# Patient Record
Sex: Male | Born: 1955 | Race: White | Hispanic: No | Marital: Married | Smoking: Never smoker
Health system: Southern US, Community
[De-identification: ages and names within clinical notes are randomized; demographics above are authoritative.]

## PROBLEM LIST (undated history)

## (undated) DIAGNOSIS — U071 COVID-19: Secondary | ICD-10-CM

## (undated) DIAGNOSIS — I442 Atrioventricular block, complete: Secondary | ICD-10-CM

## (undated) DIAGNOSIS — R55 Syncope and collapse: Secondary | ICD-10-CM

---

## 2014-03-28 DIAGNOSIS — F259 Schizoaffective disorder, unspecified: Secondary | ICD-10-CM | POA: Insufficient documentation

## 2018-05-07 DIAGNOSIS — E860 Dehydration: Secondary | ICD-10-CM

## 2018-05-07 DIAGNOSIS — R7309 Other abnormal glucose: Secondary | ICD-10-CM

## 2018-05-07 DIAGNOSIS — F411 Generalized anxiety disorder: Secondary | ICD-10-CM

## 2018-05-07 DIAGNOSIS — R55 Syncope and collapse: Secondary | ICD-10-CM

## 2018-05-07 DIAGNOSIS — I4510 Unspecified right bundle-branch block: Secondary | ICD-10-CM

## 2018-05-08 DIAGNOSIS — R55 Syncope and collapse: Secondary | ICD-10-CM | POA: Diagnosis not present

## 2018-05-08 DIAGNOSIS — I4510 Unspecified right bundle-branch block: Secondary | ICD-10-CM | POA: Diagnosis not present

## 2018-05-08 DIAGNOSIS — R7309 Other abnormal glucose: Secondary | ICD-10-CM | POA: Diagnosis not present

## 2018-05-08 DIAGNOSIS — E860 Dehydration: Secondary | ICD-10-CM | POA: Diagnosis not present

## 2018-05-20 DIAGNOSIS — R55 Syncope and collapse: Secondary | ICD-10-CM | POA: Insufficient documentation

## 2018-08-01 DIAGNOSIS — R55 Syncope and collapse: Secondary | ICD-10-CM

## 2018-08-01 HISTORY — DX: Syncope and collapse: R55

## 2018-09-14 ENCOUNTER — Inpatient Hospital Stay (HOSPITAL_COMMUNITY): Payer: PRIVATE HEALTH INSURANCE

## 2018-09-14 ENCOUNTER — Other Ambulatory Visit: Payer: Self-pay

## 2018-09-14 ENCOUNTER — Encounter (HOSPITAL_COMMUNITY): Payer: Self-pay

## 2018-09-14 ENCOUNTER — Encounter (HOSPITAL_COMMUNITY): Payer: Self-pay | Admitting: *Deleted

## 2018-09-14 ENCOUNTER — Emergency Department (HOSPITAL_COMMUNITY): Payer: PRIVATE HEALTH INSURANCE

## 2018-09-14 ENCOUNTER — Inpatient Hospital Stay (HOSPITAL_COMMUNITY)
Admission: EM | Admit: 2018-09-14 | Discharge: 2018-10-11 | DRG: 242 | Disposition: A | Discharge status: Rehabilitation Facility | Payer: PRIVATE HEALTH INSURANCE | Attending: Internal Medicine | Admitting: Internal Medicine

## 2018-09-14 ENCOUNTER — Inpatient Hospital Stay (HOSPITAL_COMMUNITY)
Admission: EM | Disposition: A | Discharge status: Rehabilitation Facility | Payer: Self-pay | Source: Home / Self Care | Attending: Internal Medicine

## 2018-09-14 DIAGNOSIS — I959 Hypotension, unspecified: Secondary | ICD-10-CM | POA: Diagnosis present

## 2018-09-14 DIAGNOSIS — K5900 Constipation, unspecified: Secondary | ICD-10-CM | POA: Diagnosis not present

## 2018-09-14 DIAGNOSIS — E876 Hypokalemia: Secondary | ICD-10-CM | POA: Diagnosis not present

## 2018-09-14 DIAGNOSIS — Z4659 Encounter for fitting and adjustment of other gastrointestinal appliance and device: Secondary | ICD-10-CM

## 2018-09-14 DIAGNOSIS — J810 Acute pulmonary edema: Secondary | ICD-10-CM

## 2018-09-14 DIAGNOSIS — L8990 Pressure ulcer of unspecified site, unspecified stage: Secondary | ICD-10-CM | POA: Diagnosis present

## 2018-09-14 DIAGNOSIS — J9601 Acute respiratory failure with hypoxia: Secondary | ICD-10-CM | POA: Diagnosis present

## 2018-09-14 DIAGNOSIS — E8770 Fluid overload, unspecified: Secondary | ICD-10-CM | POA: Diagnosis not present

## 2018-09-14 DIAGNOSIS — Z959 Presence of cardiac and vascular implant and graft, unspecified: Secondary | ICD-10-CM | POA: Diagnosis not present

## 2018-09-14 DIAGNOSIS — Z6825 Body mass index (BMI) 25.0-25.9, adult: Secondary | ICD-10-CM

## 2018-09-14 DIAGNOSIS — Z781 Physical restraint status: Secondary | ICD-10-CM

## 2018-09-14 DIAGNOSIS — E875 Hyperkalemia: Secondary | ICD-10-CM | POA: Diagnosis present

## 2018-09-14 DIAGNOSIS — Z9911 Dependence on respirator [ventilator] status: Secondary | ICD-10-CM | POA: Diagnosis not present

## 2018-09-14 DIAGNOSIS — Z79899 Other long term (current) drug therapy: Secondary | ICD-10-CM

## 2018-09-14 DIAGNOSIS — R579 Shock, unspecified: Secondary | ICD-10-CM

## 2018-09-14 DIAGNOSIS — J9602 Acute respiratory failure with hypercapnia: Secondary | ICD-10-CM | POA: Diagnosis present

## 2018-09-14 DIAGNOSIS — R5381 Other malaise: Secondary | ICD-10-CM

## 2018-09-14 DIAGNOSIS — R109 Unspecified abdominal pain: Secondary | ICD-10-CM

## 2018-09-14 DIAGNOSIS — R259 Unspecified abnormal involuntary movements: Secondary | ICD-10-CM | POA: Diagnosis not present

## 2018-09-14 DIAGNOSIS — E871 Hypo-osmolality and hyponatremia: Secondary | ICD-10-CM | POA: Diagnosis not present

## 2018-09-14 DIAGNOSIS — J189 Pneumonia, unspecified organism: Secondary | ICD-10-CM | POA: Diagnosis not present

## 2018-09-14 DIAGNOSIS — F419 Anxiety disorder, unspecified: Secondary | ICD-10-CM | POA: Diagnosis present

## 2018-09-14 DIAGNOSIS — Z95818 Presence of other cardiac implants and grafts: Secondary | ICD-10-CM

## 2018-09-14 DIAGNOSIS — R258 Other abnormal involuntary movements: Secondary | ICD-10-CM | POA: Diagnosis not present

## 2018-09-14 DIAGNOSIS — J690 Pneumonitis due to inhalation of food and vomit: Secondary | ICD-10-CM | POA: Diagnosis not present

## 2018-09-14 DIAGNOSIS — E870 Hyperosmolality and hypernatremia: Secondary | ICD-10-CM | POA: Diagnosis not present

## 2018-09-14 DIAGNOSIS — N179 Acute kidney failure, unspecified: Secondary | ICD-10-CM | POA: Diagnosis present

## 2018-09-14 DIAGNOSIS — J9600 Acute respiratory failure, unspecified whether with hypoxia or hypercapnia: Secondary | ICD-10-CM | POA: Diagnosis not present

## 2018-09-14 DIAGNOSIS — I442 Atrioventricular block, complete: Secondary | ICD-10-CM | POA: Diagnosis present

## 2018-09-14 DIAGNOSIS — F251 Schizoaffective disorder, depressive type: Secondary | ICD-10-CM | POA: Diagnosis present

## 2018-09-14 DIAGNOSIS — E669 Obesity, unspecified: Secondary | ICD-10-CM | POA: Diagnosis present

## 2018-09-14 DIAGNOSIS — D72829 Elevated white blood cell count, unspecified: Secondary | ICD-10-CM

## 2018-09-14 DIAGNOSIS — I361 Nonrheumatic tricuspid (valve) insufficiency: Secondary | ICD-10-CM

## 2018-09-14 DIAGNOSIS — G9340 Encephalopathy, unspecified: Secondary | ICD-10-CM

## 2018-09-14 DIAGNOSIS — G9341 Metabolic encephalopathy: Secondary | ICD-10-CM | POA: Diagnosis not present

## 2018-09-14 DIAGNOSIS — R1310 Dysphagia, unspecified: Secondary | ICD-10-CM | POA: Diagnosis not present

## 2018-09-14 DIAGNOSIS — F329 Major depressive disorder, single episode, unspecified: Secondary | ICD-10-CM | POA: Diagnosis present

## 2018-09-14 DIAGNOSIS — F05 Delirium due to known physiological condition: Secondary | ICD-10-CM | POA: Diagnosis not present

## 2018-09-14 DIAGNOSIS — R197 Diarrhea, unspecified: Secondary | ICD-10-CM | POA: Diagnosis not present

## 2018-09-14 DIAGNOSIS — R4182 Altered mental status, unspecified: Secondary | ICD-10-CM | POA: Diagnosis not present

## 2018-09-14 DIAGNOSIS — R251 Tremor, unspecified: Secondary | ICD-10-CM | POA: Diagnosis not present

## 2018-09-14 DIAGNOSIS — R40243 Glasgow coma scale score 3-8, unspecified time: Secondary | ICD-10-CM | POA: Diagnosis not present

## 2018-09-14 DIAGNOSIS — E872 Acidosis: Secondary | ICD-10-CM | POA: Diagnosis present

## 2018-09-14 DIAGNOSIS — J9690 Respiratory failure, unspecified, unspecified whether with hypoxia or hypercapnia: Secondary | ICD-10-CM

## 2018-09-14 DIAGNOSIS — Z978 Presence of other specified devices: Secondary | ICD-10-CM

## 2018-09-14 DIAGNOSIS — J80 Acute respiratory distress syndrome: Secondary | ICD-10-CM | POA: Diagnosis not present

## 2018-09-14 DIAGNOSIS — A419 Sepsis, unspecified organism: Secondary | ICD-10-CM | POA: Diagnosis not present

## 2018-09-14 DIAGNOSIS — Z452 Encounter for adjustment and management of vascular access device: Secondary | ICD-10-CM

## 2018-09-14 DIAGNOSIS — Y95 Nosocomial condition: Secondary | ICD-10-CM | POA: Diagnosis not present

## 2018-09-14 DIAGNOSIS — Z9289 Personal history of other medical treatment: Secondary | ICD-10-CM

## 2018-09-14 HISTORY — DX: Atrioventricular block, complete: I44.2

## 2018-09-14 HISTORY — PX: TEMPORARY PACEMAKER: CATH118268

## 2018-09-14 HISTORY — PX: LEFT HEART CATH AND CORONARY ANGIOGRAPHY: CATH118249

## 2018-09-14 HISTORY — DX: Syncope and collapse: R55

## 2018-09-14 LAB — COMPREHENSIVE METABOLIC PANEL WITH GFR
ALT: 27 U/L (ref 0–44)
AST: 33 U/L (ref 15–41)
Albumin: 3.6 g/dL (ref 3.5–5.0)
Alkaline Phosphatase: 48 U/L (ref 38–126)
Anion gap: 17 — ABNORMAL HIGH (ref 5–15)
BUN: 17 mg/dL (ref 8–23)
CO2: 17 mmol/L — ABNORMAL LOW (ref 22–32)
Calcium: 8.7 mg/dL — ABNORMAL LOW (ref 8.9–10.3)
Chloride: 107 mmol/L (ref 98–111)
Creatinine, Ser: 1.61 mg/dL — ABNORMAL HIGH (ref 0.61–1.24)
GFR calc Af Amer: 52 mL/min — ABNORMAL LOW
GFR calc non Af Amer: 45 mL/min — ABNORMAL LOW
Glucose, Bld: 161 mg/dL — ABNORMAL HIGH (ref 70–99)
Potassium: 4.1 mmol/L (ref 3.5–5.1)
Sodium: 141 mmol/L (ref 135–145)
Total Bilirubin: 0.9 mg/dL (ref 0.3–1.2)
Total Protein: 6.2 g/dL — ABNORMAL LOW (ref 6.5–8.1)

## 2018-09-14 LAB — POCT I-STAT, CHEM 8
BUN: 17 mg/dL (ref 8–23)
CALCIUM ION: 1.27 mmol/L (ref 1.15–1.40)
CHLORIDE: 106 mmol/L (ref 98–111)
CREATININE: 1.3 mg/dL — AB (ref 0.61–1.24)
GLUCOSE: 155 mg/dL — AB (ref 70–99)
HCT: 42 % (ref 39.0–52.0)
HEMOGLOBIN: 14.3 g/dL (ref 13.0–17.0)
Potassium: 3.7 mmol/L (ref 3.5–5.1)
Sodium: 145 mmol/L (ref 135–145)
TCO2: 24 mmol/L (ref 22–32)

## 2018-09-14 LAB — CBC WITH DIFFERENTIAL/PLATELET
Abs Immature Granulocytes: 0.12 10*3/uL — ABNORMAL HIGH (ref 0.00–0.07)
BASOS ABS: 0.1 10*3/uL (ref 0.0–0.1)
BASOS PCT: 1 %
EOS ABS: 0.5 10*3/uL (ref 0.0–0.5)
EOS PCT: 4 %
HCT: 46 % (ref 39.0–52.0)
Hemoglobin: 14.5 g/dL (ref 13.0–17.0)
IMMATURE GRANULOCYTES: 1 %
Lymphocytes Relative: 24 %
Lymphs Abs: 2.8 10*3/uL (ref 0.7–4.0)
MCH: 29 pg (ref 26.0–34.0)
MCHC: 31.5 g/dL (ref 30.0–36.0)
MCV: 92 fL (ref 80.0–100.0)
Monocytes Absolute: 1.1 10*3/uL — ABNORMAL HIGH (ref 0.1–1.0)
Monocytes Relative: 9 %
NEUTROS PCT: 61 %
NRBC: 0 % (ref 0.0–0.2)
Neutro Abs: 7 10*3/uL (ref 1.7–7.7)
PLATELETS: 263 10*3/uL (ref 150–400)
RBC: 5 MIL/uL (ref 4.22–5.81)
RDW: 14.6 % (ref 11.5–15.5)
WBC: 11.6 10*3/uL — ABNORMAL HIGH (ref 4.0–10.5)

## 2018-09-14 LAB — I-STAT CHEM 8, ED
BUN: 26 mg/dL — ABNORMAL HIGH (ref 8–23)
Calcium, Ion: 1.01 mmol/L — ABNORMAL LOW (ref 1.15–1.40)
Chloride: 109 mmol/L (ref 98–111)
Creatinine, Ser: 1.5 mg/dL — ABNORMAL HIGH (ref 0.61–1.24)
Glucose, Bld: 156 mg/dL — ABNORMAL HIGH (ref 70–99)
HCT: 45 % (ref 39.0–52.0)
Hemoglobin: 15.3 g/dL (ref 13.0–17.0)
Potassium: 7.5 mmol/L (ref 3.5–5.1)
Sodium: 138 mmol/L (ref 135–145)
TCO2: 20 mmol/L — ABNORMAL LOW (ref 22–32)

## 2018-09-14 LAB — POCT I-STAT 3, ART BLOOD GAS (G3+)
ACID-BASE DEFICIT: 4 mmol/L — AB (ref 0.0–2.0)
Acid-Base Excess: 1 mmol/L (ref 0.0–2.0)
BICARBONATE: 22.6 mmol/L (ref 20.0–28.0)
Bicarbonate: 25.7 mmol/L (ref 20.0–28.0)
O2 Saturation: 98 %
O2 Saturation: 100 %
PCO2 ART: 42.4 mmHg (ref 32.0–48.0)
PH ART: 7.291 — AB (ref 7.350–7.450)
PH ART: 7.391 (ref 7.350–7.450)
PO2 ART: 125 mmHg — AB (ref 83.0–108.0)
TCO2: 24 mmol/L (ref 22–32)
TCO2: 27 mmol/L (ref 22–32)
pCO2 arterial: 46.9 mmHg (ref 32.0–48.0)
pO2, Arterial: 272 mmHg — ABNORMAL HIGH (ref 83.0–108.0)

## 2018-09-14 LAB — MAGNESIUM: Magnesium: 1.3 mg/dL — ABNORMAL LOW (ref 1.7–2.4)

## 2018-09-14 LAB — CBC
HCT: 41.1 % (ref 39.0–52.0)
HEMOGLOBIN: 13.1 g/dL (ref 13.0–17.0)
MCH: 28.2 pg (ref 26.0–34.0)
MCHC: 31.9 g/dL (ref 30.0–36.0)
MCV: 88.6 fL (ref 80.0–100.0)
Platelets: 246 10*3/uL (ref 150–400)
RBC: 4.64 MIL/uL (ref 4.22–5.81)
RDW: 14.5 % (ref 11.5–15.5)
WBC: 14.7 10*3/uL — ABNORMAL HIGH (ref 4.0–10.5)
nRBC: 0 % (ref 0.0–0.2)

## 2018-09-14 LAB — CREATININE, SERUM
Creatinine, Ser: 1.13 mg/dL (ref 0.61–1.24)
GFR calc Af Amer: >60 mL/min (ref >60)
GFR calc non Af Amer: >60 mL/min (ref >60)

## 2018-09-14 LAB — ECHOCARDIOGRAM COMPLETE: HEIGHTINCHES: 72 in

## 2018-09-14 LAB — TROPONIN I
TROPONIN I: 0.07 ng/mL — AB (ref <0.03)
TROPONIN I: 0.2 ng/mL — AB (ref <0.03)
TROPONIN I: 0.26 ng/mL — AB (ref <0.03)

## 2018-09-14 LAB — MRSA PCR SCREENING: MRSA BY PCR: POSITIVE — AB

## 2018-09-14 LAB — POCT I-STAT TROPONIN I: Troponin i, poc: 0 ng/mL (ref 0.00–0.08)

## 2018-09-14 LAB — PROTIME-INR
INR: 1.17
Prothrombin Time: 14.8 s (ref 11.4–15.2)

## 2018-09-14 LAB — TSH: TSH: 12.49 u[IU]/mL — AB (ref 0.350–4.500)

## 2018-09-14 SURGERY — LEFT HEART CATH AND CORONARY ANGIOGRAPHY
Anesthesia: LOCAL

## 2018-09-14 MED ORDER — HEPARIN SODIUM (PORCINE) 1000 UNIT/ML IJ SOLN
INTRAMUSCULAR | Status: DC | PRN
  Administered 2018-09-14: 5000 [IU] via INTRAVENOUS

## 2018-09-14 MED ORDER — ONDANSETRON HCL 4 MG/2ML IJ SOLN: 4.0000 mg | Freq: Four times a day (QID) | INTRAMUSCULAR | Status: DC | PRN

## 2018-09-14 MED ORDER — FENTANYL CITRATE (PF) 100 MCG/2ML IJ SOLN
INTRAMUSCULAR | Status: AC
  Administered 2018-09-14: 100 ug via INTRAVENOUS
  Filled 2018-09-14: qty 2

## 2018-09-14 MED ORDER — HEPARIN (PORCINE) IN NACL 1000-0.9 UT/500ML-% IV SOLN
INTRAVENOUS | Status: DC | PRN
  Administered 2018-09-14 (×2): 500 mL

## 2018-09-14 MED ORDER — ROCURONIUM BROMIDE 50 MG/5ML IV SOLN
INTRAVENOUS | Status: AC | PRN
  Administered 2018-09-14: 10 mg via INTRAVENOUS

## 2018-09-14 MED ORDER — MIDAZOLAM HCL 2 MG/2ML IJ SOLN
INTRAMUSCULAR | Status: AC
  Filled 2018-09-14: qty 2

## 2018-09-14 MED ORDER — MIDAZOLAM HCL 2 MG/2ML IJ SOLN
2.0000 mg | INTRAMUSCULAR | Status: DC | PRN
  Administered 2018-09-14 – 2018-09-15 (×9): 2 mg via INTRAVENOUS
  Filled 2018-09-14 (×5): qty 2

## 2018-09-14 MED ORDER — FENTANYL 2500MCG IN NS 250ML (10MCG/ML) PREMIX INFUSION
25.0000 ug/h | INTRAVENOUS | Status: DC
  Administered 2018-09-14: 300 ug/h via INTRAVENOUS
  Administered 2018-09-14: 50 ug/h via INTRAVENOUS
  Administered 2018-09-15: 250 ug/h via INTRAVENOUS
  Filled 2018-09-14 (×3): qty 250

## 2018-09-14 MED ORDER — FENTANYL BOLUS VIA INFUSION
50.0000 ug | INTRAVENOUS | Status: DC | PRN
  Administered 2018-09-14: 50 ug via INTRAVENOUS
  Filled 2018-09-14: qty 50

## 2018-09-14 MED ORDER — ACETAMINOPHEN 325 MG PO TABS: 650.0000 mg | ORAL_TABLET | ORAL | Status: DC | PRN

## 2018-09-14 MED ORDER — SODIUM CHLORIDE 0.9% FLUSH: 3.0000 mL | INTRAVENOUS | Status: DC | PRN

## 2018-09-14 MED ORDER — FUROSEMIDE 10 MG/ML IJ SOLN
20.0000 mg | Freq: Once | INTRAMUSCULAR | Status: AC
  Administered 2018-09-14: 20 mg via INTRAVENOUS
  Filled 2018-09-14: qty 2

## 2018-09-14 MED ORDER — HEPARIN (PORCINE) IN NACL 1000-0.9 UT/500ML-% IV SOLN
INTRAVENOUS | Status: AC
  Filled 2018-09-14: qty 1500

## 2018-09-14 MED ORDER — SODIUM CHLORIDE 0.9 % IV SOLN
250.0000 mL | INTRAVENOUS | Status: DC | PRN
  Administered 2018-09-14: 20 mL via INTRAVENOUS
  Administered 2018-09-15: 02:00:00 via INTRAVENOUS
  Administered 2018-09-18 – 2018-09-24 (×3): 250 mL via INTRAVENOUS

## 2018-09-14 MED ORDER — FENTANYL CITRATE (PF) 100 MCG/2ML IJ SOLN
INTRAMUSCULAR | Status: AC
  Filled 2018-09-14: qty 2

## 2018-09-14 MED ORDER — VERAPAMIL HCL 2.5 MG/ML IV SOLN
INTRAVENOUS | Status: AC
  Filled 2018-09-14: qty 2

## 2018-09-14 MED ORDER — ORAL CARE MOUTH RINSE
15.0000 mL | OROMUCOSAL | Status: DC
  Administered 2018-09-14 – 2018-09-15 (×9): 15 mL via OROMUCOSAL

## 2018-09-14 MED ORDER — LIDOCAINE HCL (PF) 1 % IJ SOLN
INTRAMUSCULAR | Status: AC
  Filled 2018-09-14: qty 30

## 2018-09-14 MED ORDER — CALCIUM CHLORIDE 10 % IV SOLN
INTRAVENOUS | Status: AC | PRN
  Administered 2018-09-14: 1 g via INTRAVENOUS

## 2018-09-14 MED ORDER — MUPIROCIN 2 % EX OINT
1.0000 "application " | TOPICAL_OINTMENT | Freq: Two times a day (BID) | CUTANEOUS | Status: AC
  Administered 2018-09-15 – 2018-09-19 (×10): 1 via NASAL
  Filled 2018-09-14: qty 22

## 2018-09-14 MED ORDER — SODIUM CHLORIDE 0.9% FLUSH
3.0000 mL | Freq: Two times a day (BID) | INTRAVENOUS | Status: DC
  Administered 2018-09-14 – 2018-09-18 (×9): 3 mL via INTRAVENOUS

## 2018-09-14 MED ORDER — MIDAZOLAM HCL 2 MG/2ML IJ SOLN
2.0000 mg | Freq: Once | INTRAMUSCULAR | Status: AC
  Administered 2018-09-14: 2 mg via INTRAVENOUS

## 2018-09-14 MED ORDER — SODIUM CHLORIDE 0.9% FLUSH
3.0000 mL | Freq: Two times a day (BID) | INTRAVENOUS | Status: DC
  Administered 2018-09-14 – 2018-09-17 (×8): 3 mL via INTRAVENOUS

## 2018-09-14 MED ORDER — ONDANSETRON HCL 4 MG/2ML IJ SOLN
4.0000 mg | Freq: Four times a day (QID) | INTRAMUSCULAR | Status: DC | PRN
  Administered 2018-10-07: 4 mg via INTRAVENOUS
  Filled 2018-09-14: qty 2

## 2018-09-14 MED ORDER — FENTANYL BOLUS VIA INFUSION
200.0000 ug | Freq: Once | INTRAVENOUS | Status: AC
  Administered 2018-09-14: 200 ug via INTRAVENOUS
  Filled 2018-09-14: qty 200

## 2018-09-14 MED ORDER — PERFLUTREN LIPID MICROSPHERE
1.0000 mL | INTRAVENOUS | Status: AC | PRN
  Administered 2018-09-14: 2 mL via INTRAVENOUS
  Filled 2018-09-14: qty 10

## 2018-09-14 MED ORDER — SODIUM CHLORIDE 0.9 % IV SOLN
INTRAVENOUS | Status: AC | PRN
  Administered 2018-09-14: 100 mL/h via INTRAVENOUS

## 2018-09-14 MED ORDER — SODIUM CHLORIDE 0.9 % IV SOLN
INTRAVENOUS | Status: AC
  Administered 2018-09-14: 10:00:00 via INTRAVENOUS

## 2018-09-14 MED ORDER — SODIUM CHLORIDE 0.9 % IV SOLN
INTRAVENOUS | Status: AC | PRN
  Administered 2018-09-14: 10 mL/h via INTRAVENOUS

## 2018-09-14 MED ORDER — CHLORHEXIDINE GLUCONATE 0.12% ORAL RINSE (MEDLINE KIT)
15.0000 mL | Freq: Two times a day (BID) | OROMUCOSAL | Status: DC
  Administered 2018-09-14 – 2018-09-15 (×2): 15 mL via OROMUCOSAL

## 2018-09-14 MED ORDER — SODIUM CHLORIDE 0.9 % IV SOLN: 250.0000 mL | INTRAVENOUS | Status: DC | PRN

## 2018-09-14 MED ORDER — SODIUM BICARBONATE 8.4 % IV SOLN
INTRAVENOUS | Status: AC | PRN
  Administered 2018-09-14: 50 meq via INTRAVENOUS

## 2018-09-14 MED ORDER — ETOMIDATE 2 MG/ML IV SOLN
INTRAVENOUS | Status: AC | PRN
  Administered 2018-09-14: 36 mg via INTRAVENOUS

## 2018-09-14 MED ORDER — ACETAMINOPHEN 325 MG PO TABS
650.0000 mg | ORAL_TABLET | ORAL | Status: DC | PRN
  Administered 2018-09-16 – 2018-09-21 (×6): 650 mg via ORAL
  Filled 2018-09-14 (×7): qty 2

## 2018-09-14 MED ORDER — HEPARIN SODIUM (PORCINE) 5000 UNIT/ML IJ SOLN
5000.0000 [IU] | Freq: Three times a day (TID) | INTRAMUSCULAR | Status: DC
  Administered 2018-09-14 – 2018-10-11 (×79): 5000 [IU] via SUBCUTANEOUS
  Filled 2018-09-14 (×78): qty 1

## 2018-09-14 MED ORDER — IOHEXOL 350 MG/ML SOLN
INTRAVENOUS | Status: DC | PRN
  Administered 2018-09-14: 55 mL via INTRAVENOUS

## 2018-09-14 MED ORDER — NITROGLYCERIN 0.4 MG SL SUBL: 0.4000 mg | SUBLINGUAL_TABLET | SUBLINGUAL | Status: DC | PRN

## 2018-09-14 MED ORDER — FENTANYL CITRATE (PF) 100 MCG/2ML IJ SOLN
50.0000 ug | Freq: Once | INTRAMUSCULAR | Status: AC
  Administered 2018-09-14: 50 ug via INTRAVENOUS

## 2018-09-14 MED ORDER — NOREPINEPHRINE 4 MG/250ML-% IV SOLN
0.0000 ug/min | INTRAVENOUS | Status: DC
  Administered 2018-09-14: 2 ug/min via INTRAVENOUS
  Administered 2018-09-15: 4 ug/min via INTRAVENOUS
  Filled 2018-09-14 (×2): qty 250

## 2018-09-14 MED ORDER — ENOXAPARIN SODIUM 40 MG/0.4ML ~~LOC~~ SOLN: 40.0000 mg | SUBCUTANEOUS | Status: DC

## 2018-09-14 MED ORDER — MIDAZOLAM HCL 2 MG/2ML IJ SOLN
1.0000 mg | INTRAMUSCULAR | Status: DC | PRN
  Administered 2018-09-14: 2 mg via INTRAVENOUS
  Filled 2018-09-14 (×5): qty 2

## 2018-09-14 MED ORDER — CHLORHEXIDINE GLUCONATE CLOTH 2 % EX PADS
6.0000 | MEDICATED_PAD | Freq: Every day | CUTANEOUS | Status: DC
  Administered 2018-09-15 – 2018-09-18 (×3): 6 via TOPICAL

## 2018-09-14 MED ORDER — FENTANYL CITRATE (PF) 100 MCG/2ML IJ SOLN
100.0000 ug | Freq: Once | INTRAMUSCULAR | Status: AC
  Administered 2018-09-14: 100 ug via INTRAVENOUS

## 2018-09-14 MED ORDER — SODIUM CHLORIDE 0.9 % IV SOLN
INTRAVENOUS | Status: DC | PRN
  Administered 2018-09-14: 10 mL/h via INTRAVENOUS

## 2018-09-14 SURGICAL SUPPLY — 14 items
CABLE ADAPT CONN TEMP 6FT (ADAPTER) ×2 IMPLANT
CATH INFINITI 5 FR JL3.5 (CATHETERS) ×2 IMPLANT
CATH INFINITI 5FR MULTPACK ANG (CATHETERS) ×2 IMPLANT
CATH S G BIP PACING (SET/KITS/TRAYS/PACK) ×2 IMPLANT
DEVICE RAD COMP TR BAND LRG (VASCULAR PRODUCTS) ×2 IMPLANT
GLIDESHEATH SLEND SS 6F .021 (SHEATH) ×2 IMPLANT
GUIDEWIRE INQWIRE 1.5J.035X260 (WIRE) ×1 IMPLANT
INQWIRE 1.5J .035X260CM (WIRE) ×2
KIT HEART LEFT (KITS) ×2 IMPLANT
PACK CARDIAC CATHETERIZATION (CUSTOM PROCEDURE TRAY) ×2 IMPLANT
SHEATH PINNACLE 6F 10CM (SHEATH) ×2 IMPLANT
SLEEVE REPOSITIONING LENGTH 30 (MISCELLANEOUS) ×2 IMPLANT
TRANSDUCER W/STOPCOCK (MISCELLANEOUS) ×2 IMPLANT
TUBING CIL FLEX 10 FLL-RA (TUBING) ×2 IMPLANT

## 2018-09-14 NOTE — Progress Notes (Signed)
  Echocardiogram 2D Echocardiogram with definity has been performed.  Leta Jungling M 09/14/2018, 10:59 AM

## 2018-09-14 NOTE — Consult Note (Addendum)
ELECTROPHYSIOLOGY CONSULT NOTE    Patient ID: Billy Simon MRN: [id number], DOB/AGE: 62-Jul-1957 62 y.o.  Admit date: 09/14/2018 Date of Consult: 09/14/2018  Primary Physician: No primary care provider on file. Primary Cardiologist: [place] (new this admission) Electrophysiologist: Doylene Splinter (new this admission)  Patient Profile: Billy Simon is a 62 y.o. male with a history of prior syncope who is being seen today for the evaluation of complete heart block at the request of Dr Swaziland.  HPI:  Billy Simon is a 62 y.o. male with no significant past cardiac history. He had a syncopal spell several weeks ago and was seen at [facility].  He underwent 2 week event monitor and echo which by report was unremarkable. This morning, his wife tried to awaken him to go to work and he was hard to arouse. He sat up in bed and then fell back with agonal breathing. EMS was called and on their arrival, he was in complete heart block.  He underwent transcutaneous pacing and was transported to [facility]. In the ER, he was agitated and underwent intubation for airway protection.  Catheterization was done which demonstrated no CAD and normal LVEF and temp wire was placed.  He is currently intubated and sedated requiring vent support.  ROS unable to be obtained   Past Medical History:  Diagnosis Date  . CHB (complete heart block) (HCC) 09/14/2018  . Syncope 08/2018   Hospitalized at [facility]     Surgical History: History reviewed. No pertinent surgical history.   No medications prior to admission.    Inpatient Medications:   Allergies: Allergies not on file  Social History   Socioeconomic History  . Marital status: Not on file    Spouse name: Not on file  . Number of children: Not on file  . Years of education: Not on file  . Highest education level: Not on file  Occupational History  . Not on file  Social Needs  . Financial resource strain: Not on file  . Food insecurity:    Worry:  Not on file    Inability: Not on file  . Transportation needs:    Medical: Not on file    Non-medical: Not on file  Tobacco Use  . Smoking status: Never Smoker  . Smokeless tobacco: Never Used  Substance and Sexual Activity  . Alcohol use: Not on file  . Drug use: Not on file  . Sexual activity: Not on file  Lifestyle  . Physical activity:    Days per week: Not on file    Minutes per session: Not on file  . Stress: Not on file  Relationships  . Social connections:    Talks on phone: Not on file    Gets together: Not on file    Attends religious service: Not on file    Active member of club or organization: Not on file    Attends meetings of clubs or organizations: Not on file    Relationship status: Not on file  . Intimate partner violence:    Fear of current or ex partner: Not on file    Emotionally abused: Not on file    Physically abused: Not on file    Forced sexual activity: Not on file  Other Topics Concern  . Not on file  Social History Narrative  . Not on file     Family History - unable to be obtained   Review of Systems: All other systems reviewed and are otherwise negative except as  noted above.  Physical Exam: Vitals:   09/14/18 0725 09/14/18 0814  BP: (!) 95/59   Pulse: 60   Temp: (!) 96.5 F (35.8 C)   TempSrc: Temporal   SpO2: 100% 100%    GEN- The patient is intubated and sedated HEENT: normocephalic, atraumatic; sclera clear, conjunctiva pink; +ETT Lungs- +vent Heart- Regular rate and rhythm (paced) GI- soft, non-tender, non-distended, bowel sounds present Extremities- no clubbing, cyanosis, or edema  MS- no significant deformity or atrophy Skin- warm and dry, no rash or lesion Psych- euthymic mood, full affect Neuro- strength and sensation are intact  Labs:   Lab Results  Component Value Date   WBC 11.6 (H) 09/14/2018   HGB 14.3 09/14/2018   HCT 42.0 09/14/2018   MCV 92.0 09/14/2018   PLT 263 09/14/2018    Recent Labs  Lab  09/14/18 0741  09/14/18 0823  NA 141   < > 145  K 4.1   < > 3.7  CL 107   < > 106  CO2 17*  --   --   BUN 17   < > 17  CREATININE 1.61*   < > 1.30*  CALCIUM 8.7*  --   --   PROT 6.2*  --   --   BILITOT 0.9  --   --   ALKPHOS 48  --   --   ALT 27  --   --   AST 33  --   --   GLUCOSE 161*   < > 155*   < > = values in this interval not displayed.      Radiology/Studies: Dg Chest Portable 1 View  Result Date: 09/14/2018 CLINICAL DATA:  post intubation/ETT PLACEMENT EXAM: PORTABLE CHEST - 1 VIEW COMPARISON:  none FINDINGS: Endotracheal tube tip 5.4 cm above carina. Bibasilar and left perihilar interstitial prominence, may be related to bronchovascular crowding secondary to low lung volumes. No confluent airspace disease. Heart size upper limits normal for technique. No effusion.  No pneumothorax. Visualized bones unremarkable. IMPRESSION: 1. Good position of endotracheal tube tip. 2. Low lung volumes with bibasilar and left perihilar interstitial prominence/atelectasis Electronically Signed   By: Corlis Leak M.D.   On: 09/14/2018 08:14    [id number] heart block (EMS EKG) (personally reviewed)  Assessment/Plan: 1.  Complete heart block The patient has symptomatic and hemodynamically unstable complete heart block requiring urgent temp wire placement this morning. Unclear how long he was bradycardic for. Catheterization demonstrated no CAD and normal LVEF. He is on no AVN blocking agents We will need to plan on pacing - CCM has seen and he is requiring significant respiratory support at this time. EP will follow   For questions or updates, please contact [facility] Please consult [email or web address] for contact info under Cardiology/STEMI.  Signed, Gypsy Balsam, NP 09/14/2018 8:52 AM  I have seen, examined the patient, and reviewed the above assessment and plan.  Changes to above are made where necessary.  On exam, ill appearing, sedated on vent but agitated.  He was found  unresponsive by family.  Initial rhythm by EMS was heart block with escape at 20s bpm.  He is s/p temp wire placement by Dr [place].  Threshold is 0.52mA currently.  Will require PPM once he recovers clinically.  I have spoken at length with patient's wife as well as with PCCM team.  Hopefully will be improved and ready for PPM tomorrow.  EP to follow closely.  Co Sign: Hillis Range, MD 09/14/2018 4:27 PM

## 2018-09-14 NOTE — Consult Note (Addendum)
PULMONARY / CRITICAL CARE MEDICINE   NAME:  Billy Simon, MRN:  [id number], DOB:  07-Jul-1956, LOS: 0 ADMISSION DATE:  09/14/2018, CONSULTATION DATE: 09/14/2018  REFERRING MD: Cardiology, CHIEF COMPLAINT: Complete heart block  BRIEF HISTORY:    62 year old with complete heart block required a cath and temporary pacer insertion on 09/14/2018 HISTORY OF PRESENT ILLNESS   62 year old with a history of syncope who presented with complete heart block.  He required intubation and transferred to Cath Lab which time he underwent a right femoral temporary pacing wire placement.  On presentation Cath Lab was noted to be hypoxic hypercarbic and acidotic.  And have frothy bloody sputum from his endotracheal tube.  He was on 100% FiO2. Therefore he was not extubated but placed on weaning protocol sedated for tube tolerance chest x-ray and arterial blood gases were ordered to monitor his current condition. SIGNIFICANT PAST MEDICAL HISTORY   Depression and anxiety  SIGNIFICANT EVENTS:  01/15/2018 admitted with complete heart block requiring temporary pacer insertion and intubation. STUDIES:   09/15/1999 nineteen 2D echo>> CULTURES:    ANTIBIOTICS:    LINES/TUBES:   09/14/2018 endotracheal tube>> 09/14/2018 right femoral pacing were temporary>> CONSULTANTS:  09/14/2018 pulmonary critical care SUBJECTIVE:  62 year old post temporary pacemaker placement 09/14/2018 only been agitated in no acute distress  CONSTITUTIONAL: BP 117/81   Pulse (!) 0   Temp (!) 96.5 F (35.8 C) (Temporal)   Resp (!) 48   Ht 6' (1.829 m)   SpO2 (!) 0%   No intake/output data recorded.     Vent Mode: PRVC FiO2 (%):  [100 %] 100 % Set Rate:  [16 bmp] 16 bmp Vt Set:  [550 mL-620 mL] 620 mL PEEP:  [5 cmH20] 5 cmH20 Plateau Pressure:  [16 cmH20] 16 cmH20  PHYSICAL EXAM: General: Well-nourished well-developed male who is extremely anxious and agitated and wrist restraints Neuro: Outside to be an anxious and  agitated follows commands moves all extremities but speaks no focal neurological deficits appreciated HEENT: Endotracheal tube in place, pupils equal reactive. Cardiovascular: Sounds regular currently being paced Lungs: Mild rhonchi mild bibasilar crackles frothy bloody sputum and endotracheal tube Abdomen: Nontender positive bowel sounds Musculoskeletal: Intact right femoral pacer in place Skin: Warm and dry  RESOLVED PROBLEM LIST   ASSESSMENT AND PLAN   Vent dependent respiratory failure in the setting of complete heart block complicated by hypoxia and acidosis. Vent bundle Wean FiO2 Stat portable chest x-ray Stat ABG Concern for pulmonary edema question if he can tolerate diuresis  Complete heart block status post right femoral pacing wire placed 09/14/2018 with plan for permanent pacemaker near future Plans for permanent pacemaker per cardiology 2D echo Per cardiology  Hypotension No need for pressors at this time If blood pressure improves we will attempt diuresis   Mild renal insufficiency Lab Results  Component Value Date   CREATININE 1.30 (H) 09/14/2018   CREATININE 1.50 (H) 09/14/2018   CREATININE 1.61 (H) 09/14/2018  Post cardiac cath 09/14/2018 Avoid nephrotoxic Hold on any diuresis at this time.  History of depression and anxiety Use IV Versed as an anxiolytic IV fentanyl for tube tolerance She remains intubated may need to switch to Precedex in the future   SUMMARY OF TODAY'S PLAN:  Presented with a complete heart block With the Cath Lab with normal coronary arteries temporary pacing wire placed in the right femoral with temporary pacing plan to put permanent pacemaker the 24-hour  Best Practice / Goals of Care / Disposition.   DVT PROPHYLAXIS:  Lovenox SUP: Pepcid NUTRITION: N.p.o. MOBILITY: Bedrest GOALS OF CARE: Code full FAMILY DISCUSSIONS: 18 2019 family updated bedside DISPOSITION remains intensive care unit following temporary pacemaker  wire  LABS  Glucose No results for input(s): GLUCAP in the last 168 hours.  BMET Recent Labs  Lab 09/14/18 0741 09/14/18 0750 09/14/18 0823  NA 141 138 145  K 4.1 7.5* 3.7  CL 107 109 106  CO2 17*  --   --   BUN 17 26* 17  CREATININE 1.61* 1.50* 1.30*  GLUCOSE 161* 156* 155*    Liver Enzymes Recent Labs  Lab 09/14/18 0741  AST 33  ALT 27  ALKPHOS 48  BILITOT 0.9  ALBUMIN 3.6    Electrolytes Recent Labs  Lab 09/14/18 0741  CALCIUM 8.7*    CBC Recent Labs  Lab 09/14/18 0741 09/14/18 0750 09/14/18 0823  WBC 11.6*  --   --   HGB 14.5 15.3 14.3  HCT 46.0 45.0 42.0  PLT 263  --   --     ABG Recent Labs  Lab 09/14/18 0822  PHART 7.291*  PCO2ART 46.9  PO2ART 125.0*    Coag's Recent Labs  Lab 09/14/18 0741  INR 1.17    Sepsis Markers No results for input(s): LATICACIDVEN, PROCALCITON, O2SATVEN in the last 168 hours.  Cardiac Enzymes No results for input(s): TROPONINI, PROBNP in the last 168 hours.  PAST MEDICAL HISTORY :   He  has a past medical history of CHB (complete heart block) (HCC) (09/14/2018) and Syncope (08/2018).  PAST SURGICAL HISTORY:  He  has a past surgical history that includes LEFT HEART CATH AND CORONARY ANGIOGRAPHY (N/A, 09/14/2018) and TEMPORARY PACEMAKER (N/A, 09/14/2018).  Allergies not on file  No current facility-administered medications on file prior to encounter.    No current outpatient medications on file prior to encounter.    FAMILY HISTORY:   His family history is not on file.  SOCIAL HISTORY:  He  reports that he has never smoked. He has never used smokeless tobacco.  REVIEW OF SYSTEMS:    Not available reviewed his family  Total CARE time per NP 62 minutes   Billy Simon ACNP Adolph Pollack PCCM Pager [phone] till 1 pm If no answer page [phone] 09/14/2018, 10:19 AM  Attending Note:  62 year old male with PMH above presenting with a complete heart block and was taken to the cath lab for a  temp pacer placement.  Patient was intubated for pulmonary edema induced respiratory failure and was transferred post to the ICU and PCCM was asked to assist.  On exam, diffuse rales.  I reviewed CXR myself, ETT is in a good position with diffuse pulmonary edema noted.  Discussed with cardiology and PCCM-NP.  Will not be able to extubate today due to high FiO2 demand.  Will begin active diureses as BP allows and pending output and FiO2 need will consider weaning in AM.  Adjust vent for ABG.  Hold off TF for now, start in AM if no chance at extubation in AM.  PCCM will continue to follow.  The patient is critically ill with multiple organ systems failure and requires high complexity decision making for assessment and support, frequent evaluation and titration of therapies, application of advanced monitoring technologies and extensive interpretation of multiple databases.   Critical Care Time devoted to patient care services described in this note is  45  Minutes. This time reflects time of care of this signee Dr Billy Simon. This critical  care time does not reflect procedure time, or teaching time or supervisory time of PA/NP/Med student/Med Resident etc but could involve care discussion time.  Billy Simon, M.D. [facility] Pulmonary/Critical Care Medicine. Pager: [phone]. After hours pager: [phone].

## 2018-09-14 NOTE — Progress Notes (Signed)
Patient Intabated

## 2018-09-14 NOTE — Progress Notes (Signed)
Pt transported to 2H from cath lab w/ no apparent complications.  Increased VT to 620 to match 8 ml/kg VT at this time.   There was an ABG done in cath lab on previous vent settings:  550 vt, 16 r, 100%, 5 peep.  ABG was: 7.29, 47 PCO2, 125 PaO2, 22.6 HCO3, -4, sat 98%  Unit RT aware.

## 2018-09-14 NOTE — ED Provider Notes (Signed)
[facility] EMERGENCY DEPARTMENT Provider Note   CSN: [id number] Arrival date & time: 09/14/18  5093     History   Chief Complaint No chief complaint on file.   HPI Billy Simon is a 62 y.o. male. Level 5 caveat due to altered mental status. HPI Patient brought in for unresponsiveness/bradycardia.  Reportedly confused this morning when wife found him.  EMS arrived and found heart rate of 30s that then went down to the 20s.  Required pacing and 10 of Versed.  Upon arrival patient confused.  Had been hypotensive with the bradycardia.  Reviewing medication list with patient appears mostly psychiatric history. No past medical history on file.  There are no active problems to display for this patient.        Home Medications    Prior to Admission medications   Not on File    Family History No family history on file.  Social History Social History   Tobacco Use  . Smoking status: Not on file  Substance Use Topics  . Alcohol use: Not on file  . Drug use: Not on file     Allergies   Patient has no allergy information on record.   Review of Systems Review of Systems  Unable to perform ROS: Mental status change     Physical Exam Updated Vital Signs There were no vitals taken for this visit.  Physical Exam  Constitutional: He appears well-developed.  HENT:  Head: Atraumatic.  Eyes: No scleral icterus.  Neck: Neck supple.  Cardiovascular:  Bradycardia   Pulmonary/Chest: Effort normal.  Abdominal: There is no tenderness.  Musculoskeletal: He exhibits edema.  Trace edema bilateral lower extremities.  Neurological:  Decreased mental status.  Breathing spontaneously and moving spontaneously.  However will not answer questions.  Will not follow commands.  Skin: Capillary refill takes less than 2 seconds. There is pallor.     ED Treatments / Results  Labs (all labs ordered are listed, but only abnormal results are displayed) Labs  Reviewed  CBC WITH DIFFERENTIAL/PLATELET - Abnormal; Notable for the following components:      Result Value   WBC 11.6 (*)    Monocytes Absolute 1.1 (*)    Abs Immature Granulocytes 0.12 (*)    All other components within normal limits  I-STAT CHEM 8, ED - Abnormal; Notable for the following components:   Potassium 7.5 (*)    BUN 26 (*)    Creatinine, Ser 1.50 (*)    Glucose, Bld 156 (*)    Calcium, Ion 1.01 (*)    TCO2 20 (*)    All other components within normal limits  COMPREHENSIVE METABOLIC PANEL  PROTIME-INR  TSH  I-STAT TROPONIN, ED    EKG None  Radiology No results found.  Procedures Procedure Name: Intubation Date/Time: 09/14/2018 8:05 AM Performed by: Davonna Belling, MD Pre-anesthesia Checklist: Patient identified Oxygen Delivery Method: Non-rebreather mask Preoxygenation: Pre-oxygenation with 100% oxygen Induction Type: Rapid sequence Ventilation: Mask ventilation without difficulty Laryngoscope Size: Glidescope and 3 Tube type: Subglottic suction tube Tube size: 7.5 mm Number of attempts: 1 Airway Equipment and Method: Video-laryngoscopy Placement Confirmation: ETT inserted through vocal cords under direct vision,  Positive ETCO2 and Breath sounds checked- equal and bilateral Secured at: 23 cm Tube secured with: ETT holder Dental Injury: Teeth and Oropharynx as per pre-operative assessment       (including critical care time)  Medications Ordered in ED Medications - No data to display   Initial Impression / Assessment  and Plan / ED Course  I have reviewed the triage vital signs and the nursing notes.  Pertinent labs & imaging results that were available during my care of the patient were reviewed by me and considered in my medical decision making (see chart for details).     Patient came in for bradycardia and altered mental status.  Trans-cutaneously paced with EMS and transferred over for [place].  Met with Dr. Martinique from cardiology upon  arrival.  Decision made to intubate due to decreased mental status and likely need for Cath Lab.  Intubated by myself.  I-STAT Chem-8 showed hyperkalemia but normal kidney function.  However the i-STAT will not show there is any hemolysis.  Was treated empirically with calcium and bicarb.  Will be taken to Cath Lab by Dr. Martinique.  CRITICAL CARE Performed by: Davonna Belling Total critical care time: 30 minutes Critical care time was exclusive of separately billable procedures and treating other patients. Critical care was necessary to treat or prevent imminent or life-threatening deterioration. Critical care was time spent personally by me on the following activities: development of treatment plan with patient and/or surrogate as well as nursing, discussions with consultants, evaluation of patient's response to treatment, examination of patient, obtaining history from patient or surrogate, ordering and performing treatments and interventions, ordering and review of laboratory studies, ordering and review of radiographic studies, pulse oximetry and re-evaluation of patient's condition.   Final Clinical Impressions(s) / ED Diagnoses   Final diagnoses:  Third degree heart block [facility])    ED Discharge Orders    None       Davonna Belling, MD 09/14/18 [phone]

## 2018-09-14 NOTE — H&P (Signed)
Cardiology Admission History and Physical:   Patient ID: Billy Simon; MRN: [id number]; DOB: 12-23-1955   Admission date: 09/14/2018  Primary Care Provider: No primary care provider on file. Primary Cardiologist:   New, Dr. Swaziland Primary Electrophysiologist:  none  Chief Complaint:  CHB  Patient Profile:   Billy Simon is a 62 y.o. male with a history of syncope, hospitalized in [place] and had an echocardiogram as well as an event monitor.  They wanted to do a nuclear stress test, but the patient refused.  He followed up with cardiology yesterday, notes are being requested, but are pending at the time of dictation.  History of Present Illness:   Billy Simon was difficult to arouse this morning when his wife went to wake him.  She stated she tried to get him awake, he sat up, then fell back with agonal respirations.  EMS was called.  He was in complete heart block on EMS arrival with a heart rate in the 30s, dropping into the 20s.  He was unresponsive.  They externally paced him, and brought him to the emergency room.  In route, he was given a total of 10 mg of Versed for pain.    In the emergency room, he was evaluated by Dr. Swaziland.  He was felt unable to protect his airway and was intubated.  His ECG is significantly abnormal with complete heart block and a ventricular escape rhythm of 35.  There was concern for an ischemic cause and he is being taken directly to the Cath Lab.  Med list was obtained from his wife, but she is not currently available.  Information was obtained from records and staff.   Past Medical History:  Diagnosis Date  . CHB (complete heart block) (HCC) 09/14/2018  . Syncope 08/2018   Hospitalized at [facility]    History reviewed. No pertinent surgical history.   Medications Prior to Admission: Prior to Admission medications   BuSpar 15 mg daily, bupropion XL 300 mg daily, benztropine 1 mg daily, Celexa 40 mg daily, Risperdal 2 mg, 3 tablets daily,  Xanax 1 mg as needed     Allergies:   Allergies not on file  Social History: The patient is married.  No other information is available due to patient condition Social History   Socioeconomic History  . Marital status: Not on file    Spouse name: Not on file  . Number of children: Not on file  . Years of education: Not on file  . Highest education level: Not on file  Occupational History  . Not on file  Social Needs  . Financial resource strain: Not on file  . Food insecurity:    Worry: Not on file    Inability: Not on file  . Transportation needs:    Medical: Not on file    Non-medical: Not on file  Tobacco Use  . Smoking status: Never Smoker  . Smokeless tobacco: Never Used  Substance and Sexual Activity  . Alcohol use: Not on file  . Drug use: Not on file  . Sexual activity: Not on file  Lifestyle  . Physical activity:    Days per week: Not on file    Minutes per session: Not on file  . Stress: Not on file  Relationships  . Social connections:    Talks on phone: Not on file    Gets together: Not on file    Attends religious service: Not on file    Active member of club  or organization: Not on file    Attends meetings of clubs or organizations: Not on file    Relationship status: Not on file  . Intimate partner violence:    Fear of current or ex partner: Not on file    Emotionally abused: Not on file    Physically abused: Not on file    Forced sexual activity: Not on file  Other Topics Concern  . Not on file  Social History Narrative  . Not on file    Family History: No information available due to patient condition The patient's family history is not on file.   The patient has no family status information on file.     ROS:  No information available due to patient condition  Physical Exam/Data:   Vitals:   09/14/18 0725 09/14/18 0814  BP: (!) 95/59   Pulse: 60   Temp: (!) 96.5 F (35.8 C)   TempSrc: Temporal   SpO2: 100% 100%   No intake or  output data in the 24 hours ending 09/14/18 0840 There were no vitals filed for this visit. There is no height or weight on file to calculate BMI.  General:  Well nourished, well developed, male, intubated HEENT: normal for age Lymph: no adenopathy Neck:  JVD not elevated Endocrine:  No thryomegaly Vascular: No carotid bruits; FA pulses 2+ bilaterally   Cardiac:  normal S1, S2; RRR; no murmur, no rub or gallop  Lungs:  clear to auscultation anteriorly, no wheezing, rhonchi or rales  Abd: soft, nontender, no hepatomegaly  Ext: No edema Musculoskeletal:  No deformities, BUE and BLE strength normal and equal Skin: warm and dry  Neuro: On the vent post Versed, but not requiring sedation at this time   EKG:  The ECG that was done 10/15 was personally reviewed and demonstrates complete heart block with a ventricular escape rhythm of 35 bpm, wide-complex  Relevant CV Studies:  ECHO: [facility] at [facility]  Laboratory Data:  Chemistry Recent Labs  Lab 09/14/18 0741 09/14/18 0750  NA 141 138  K 4.1 7.5*  CL 107 109  CO2 17*  --   GLUCOSE 161* 156*  BUN 17 26*  CREATININE 1.61* 1.50*  CALCIUM 8.7*  --   GFRNONAA 45*  --   GFRAA 52*  --   ANIONGAP 17*  --     Recent Labs  Lab 09/14/18 0741  PROT 6.2*  ALBUMIN 3.6  AST 33  ALT 27  ALKPHOS 48  BILITOT 0.9   Hematology Recent Labs  Lab 09/14/18 0741 09/14/18 0750  WBC 11.6*  --   RBC 5.00  --   HGB 14.5 15.3  HCT 46.0 45.0  MCV 92.0  --   MCH 29.0  --   MCHC 31.5  --   RDW 14.6  --   PLT 263  --    Cardiac EnzymesNo results for input(s): TROPONINI in the last 168 hours.  Recent Labs  Lab 09/14/18 0748  TROPIPOC 0.00    BNPNo results for input(s): BNP, PROBNP in the last 168 hours.  DDimer No results for input(s): DDIMER in the last 168 hours.  Radiology/Studies:  Dg Chest Portable 1 View  Result Date: 09/14/2018 CLINICAL DATA:  post intubation/ETT PLACEMENT EXAM: PORTABLE CHEST - 1 VIEW COMPARISON:  none  FINDINGS: Endotracheal tube tip 5.4 cm above carina. Bibasilar and left perihilar interstitial prominence, may be related to bronchovascular crowding secondary to low lung volumes. No confluent airspace disease. Heart size upper limits normal  for technique. No effusion.  No pneumothorax. Visualized bones unremarkable. IMPRESSION: 1. Good position of endotracheal tube tip. 2. Low lung volumes with bibasilar and left perihilar interstitial prominence/atelectasis Electronically Signed   By: Corlis Leak M.D.   On: 09/14/2018 08:14    Assessment and Plan:   Principal Problem: 1.  CHB (complete heart block) (HCC) -Dr. Swaziland did a catheterization, cors only.  It was clean. - Meds reviewed and none of them are known to cause bradycardia - EP consult pending  2. Respiratory failure: CCM will be contacted to manage   Severity of Illness: The appropriate patient status for this patient is INPATIENT. Inpatient status is judged to be reasonable and necessary in order to provide the required intensity of service to ensure the patient's safety. The patient's presenting symptoms, physical exam findings, and initial radiographic and laboratory data in the context of their chronic comorbidities is felt to place them at high risk for further clinical deterioration. Furthermore, it is not anticipated that the patient will be medically stable for discharge from the hospital within 2 midnights of admission. The following factors support the patient status of inpatient.   " The patient's presenting symptoms include unresponsiveness. " The worrisome physical exam findings include bradycardia. " The initial radiographic and laboratory data are worrisome because of abnormal renal function and complete heart block. " The chronic co-morbidities include syncope.   * I certify that at the point of admission it is my clinical judgment that the patient will require inpatient hospital care spanning beyond 2 midnights from  the point of admission due to high intensity of service, high risk for further deterioration and high frequency of surveillance required.*    For questions or updates, please contact [facility] Please consult [email or web address] for contact info under Cardiology/STEMI.    SignedTheodore Demark, PA-C  09/14/2018 8:40 AM

## 2018-09-14 NOTE — ED Triage Notes (Signed)
Patient presents to ed via [facility] states  Patients wife was trying to wake him up this am for work and he wouldn't wake up. States her and her son was able to get him up he was c/o nausea and felt like he needed to vomit. seh called 911. Upon ems arrival patient was alert and oriented, however his heart rate was 30 then dropped to 20 . Patient was placed on pacer and given Versed 10 mg IV . Ems states patient became confused after versed. Was given 500cc fluid per ems, patient was being paced at rate of 60 ma of 95. Upon arrival patient is unresponsive, being paced with capture.

## 2018-09-14 NOTE — Progress Notes (Signed)
Patient intabated

## 2018-09-14 NOTE — Progress Notes (Signed)
Dr. Gilmore Laroche paged and spoke with regarding hypotension in the setting of heavy sedation to keep pt calm on vent. Orders received for levo gtt. Will continue to monitor closely. Modena Jansky RN 2 Heart

## 2018-09-14 NOTE — Progress Notes (Signed)
Orthopedic Tech Progress Note Patient Details:  Billy Simon [birth year] [id number]  Ortho Devices Type of Ortho Device: Knee Immobilizer Ortho Device/Splint Interventions: Application   Post Interventions Patient Tolerated: Well   Saul Fordyce 09/14/2018, 2:47 PM

## 2018-09-15 ENCOUNTER — Inpatient Hospital Stay (HOSPITAL_COMMUNITY): Payer: PRIVATE HEALTH INSURANCE

## 2018-09-15 DIAGNOSIS — N179 Acute kidney failure, unspecified: Secondary | ICD-10-CM

## 2018-09-15 LAB — BASIC METABOLIC PANEL
Anion gap: 9 (ref 5–15)
BUN: 20 mg/dL (ref 8–23)
CALCIUM: 8.8 mg/dL — AB (ref 8.9–10.3)
CO2: 23 mmol/L (ref 22–32)
CREATININE: 1.54 mg/dL — AB (ref 0.61–1.24)
Chloride: 109 mmol/L (ref 98–111)
GFR, EST AFRICAN AMERICAN: 54 mL/min — AB (ref >60)
GFR, EST NON AFRICAN AMERICAN: 47 mL/min — AB (ref >60)
Glucose, Bld: 115 mg/dL — ABNORMAL HIGH (ref 70–99)
Potassium: 4.7 mmol/L (ref 3.5–5.1)
SODIUM: 141 mmol/L (ref 135–145)

## 2018-09-15 LAB — BLOOD GAS, ARTERIAL
Acid-base deficit: 0 mmol/L (ref 0.0–2.0)
Bicarbonate: 25 mmol/L (ref 20.0–28.0)
DRAWN BY: 511911
FIO2: 40
MECHVT: 620 mL
O2 Saturation: 98.9 %
PEEP: 8 cmH2O
Patient temperature: 99
RATE: 16 resp/min
pCO2 arterial: 47.9 mmHg (ref 32.0–48.0)
pH, Arterial: 7.339 — ABNORMAL LOW (ref 7.350–7.450)
pO2, Arterial: 161 mmHg — ABNORMAL HIGH (ref 83.0–108.0)

## 2018-09-15 LAB — CBC WITH DIFFERENTIAL/PLATELET
ABS IMMATURE GRANULOCYTES: 0.08 10*3/uL — AB (ref 0.00–0.07)
BASOS PCT: 0 %
Basophils Absolute: 0 10*3/uL (ref 0.0–0.1)
Eosinophils Absolute: 0.2 10*3/uL (ref 0.0–0.5)
Eosinophils Relative: 1 %
HCT: 44.7 % (ref 39.0–52.0)
HEMOGLOBIN: 13.9 g/dL (ref 13.0–17.0)
Immature Granulocytes: 1 %
LYMPHS PCT: 11 %
Lymphs Abs: 1.5 10*3/uL (ref 0.7–4.0)
MCH: 28.1 pg (ref 26.0–34.0)
MCHC: 31.1 g/dL (ref 30.0–36.0)
MCV: 90.5 fL (ref 80.0–100.0)
MONO ABS: 1.7 10*3/uL — AB (ref 0.1–1.0)
Monocytes Relative: 12 %
NEUTROS ABS: 10.1 10*3/uL — AB (ref 1.7–7.7)
Neutrophils Relative %: 75 %
PLATELETS: 171 10*3/uL (ref 150–400)
RBC: 4.94 MIL/uL (ref 4.22–5.81)
RDW: 14.5 % (ref 11.5–15.5)
WBC: 13.6 10*3/uL — AB (ref 4.0–10.5)
nRBC: 0 % (ref 0.0–0.2)

## 2018-09-15 LAB — MAGNESIUM: MAGNESIUM: 1.7 mg/dL (ref 1.7–2.4)

## 2018-09-15 LAB — HIV ANTIBODY (ROUTINE TESTING W REFLEX): HIV SCREEN 4TH GENERATION: NONREACTIVE

## 2018-09-15 LAB — PHOSPHORUS: PHOSPHORUS: 3.8 mg/dL (ref 2.5–4.6)

## 2018-09-15 MED ORDER — HALOPERIDOL LACTATE 5 MG/ML IJ SOLN
INTRAMUSCULAR | Status: AC
  Administered 2018-09-15: 5 mg
  Filled 2018-09-15: qty 1

## 2018-09-15 MED ORDER — MAGNESIUM SULFATE 2 GM/50ML IV SOLN
2.0000 g | Freq: Once | INTRAVENOUS | Status: AC
  Administered 2018-09-15: 2 g via INTRAVENOUS
  Filled 2018-09-15: qty 50

## 2018-09-15 MED ORDER — DEXMEDETOMIDINE HCL IN NACL 400 MCG/100ML IV SOLN: 0.4000 ug/kg/h | INTRAVENOUS | Status: DC

## 2018-09-15 MED ORDER — ORAL CARE MOUTH RINSE
15.0000 mL | Freq: Two times a day (BID) | OROMUCOSAL | Status: DC
  Administered 2018-09-15 – 2018-09-17 (×4): 15 mL via OROMUCOSAL

## 2018-09-15 MED ORDER — HALOPERIDOL LACTATE 5 MG/ML IJ SOLN
5.0000 mg | Freq: Four times a day (QID) | INTRAMUSCULAR | Status: DC | PRN
  Administered 2018-09-15 – 2018-09-18 (×7): 5 mg via INTRAVENOUS
  Filled 2018-09-15 (×9): qty 1

## 2018-09-15 MED ORDER — BENZTROPINE MESYLATE 1 MG PO TABS
1.0000 mg | ORAL_TABLET | Freq: Every day | ORAL | Status: DC
  Administered 2018-09-16 – 2018-09-17 (×2): 1 mg via ORAL
  Filled 2018-09-15 (×4): qty 1

## 2018-09-15 MED ORDER — CITALOPRAM HYDROBROMIDE 20 MG PO TABS
40.0000 mg | ORAL_TABLET | Freq: Every day | ORAL | Status: DC
  Administered 2018-09-16 – 2018-09-20 (×5): 40 mg
  Filled 2018-09-15 (×6): qty 2

## 2018-09-15 MED ORDER — BUSPIRONE HCL 5 MG PO TABS
15.0000 mg | ORAL_TABLET | Freq: Two times a day (BID) | ORAL | Status: DC
  Administered 2018-09-16 – 2018-09-20 (×9): 15 mg via ORAL
  Filled 2018-09-15 (×9): qty 1

## 2018-09-15 MED ORDER — RISPERIDONE 3 MG PO TABS
3.0000 mg | ORAL_TABLET | Freq: Two times a day (BID) | ORAL | Status: DC
  Filled 2018-09-15 (×3): qty 1

## 2018-09-15 MED ORDER — FENTANYL CITRATE (PF) 100 MCG/2ML IJ SOLN
25.0000 ug | INTRAMUSCULAR | Status: DC | PRN
  Administered 2018-09-17 – 2018-09-18 (×8): 50 ug via INTRAVENOUS
  Administered 2018-09-18: 25 ug via INTRAVENOUS
  Administered 2018-09-18 – 2018-09-22 (×15): 50 ug via INTRAVENOUS
  Filled 2018-09-15 (×24): qty 2

## 2018-09-15 MED ORDER — LORAZEPAM 2 MG/ML IJ SOLN
0.5000 mg | INTRAMUSCULAR | Status: DC | PRN
  Administered 2018-09-15 – 2018-09-16 (×6): 1 mg via INTRAVENOUS
  Filled 2018-09-15 (×6): qty 1

## 2018-09-15 MED ORDER — FAMOTIDINE IN NACL 20-0.9 MG/50ML-% IV SOLN
20.0000 mg | Freq: Two times a day (BID) | INTRAVENOUS | Status: DC
  Administered 2018-09-15 – 2018-09-28 (×27): 20 mg via INTRAVENOUS
  Filled 2018-09-15 (×27): qty 50

## 2018-09-15 MED FILL — Heparin Sod (Porcine)-NaCl IV Soln 1000 Unit/500ML-0.9%: INTRAVENOUS | Qty: 500 | Status: AC

## 2018-09-15 MED FILL — Verapamil HCl IV Soln 2.5 MG/ML: INTRAVENOUS | Qty: 2 | Status: AC

## 2018-09-15 MED FILL — Lidocaine HCl Local Preservative Free (PF) Inj 1%: INTRAMUSCULAR | Qty: 30 | Status: AC

## 2018-09-15 NOTE — Procedures (Signed)
Extubation Procedure Note  Patient Details:   Name: Billy Simon DOB: September 08, 1956 MRN: [id number]   Airway Documentation:    Vent end date: 09/15/18 Vent end time: 1130   Evaluation  O2 sats: stable throughout Complications: No apparent complications Patient did tolerate procedure well. Bilateral Breath Sounds: Clear   Yes   Patient extubated to 4L nasal cannula.  Patient unable to perform incentive spirometry at this time due to altered mental status.  Sats maintaining at 90%.  Vitals are stable.  Patient able to phonate.  Will continue to monitor.   Ledell Noss N 09/15/2018, 11:44 AM

## 2018-09-15 NOTE — Progress Notes (Signed)
Orthopedic Tech Progress Note Patient Details:  Billy Simon 02-01-1956 [id number]  Patient ID: [name], male   DOB: [birth year], 62 y.o.   MRN: [id number]   Nikki Dom 09/15/2018, 1:42 PM Viewed order from RN order list

## 2018-09-15 NOTE — Progress Notes (Signed)
Progress Note   Subjective   Extubated today.  Agitated initially, improved with haldol.  Inpatient Medications    Scheduled Meds: . benztropine  1 mg Oral Daily  . busPIRone  15 mg Oral BID  . Chlorhexidine Gluconate Cloth  6 each Topical Q0600  . citalopram  40 mg Per Tube Daily  . heparin  5,000 Units Subcutaneous Q8H  . mouth rinse  15 mL Mouth Rinse BID  . mupirocin ointment  1 application Nasal BID  . risperiDONE  3 mg Per Tube BID  . sodium chloride flush  3 mL Intravenous Q12H  . sodium chloride flush  3 mL Intravenous Q12H   Continuous Infusions: . sodium chloride    . sodium chloride 10 mL/hr at 09/15/18 1800  . dexmedetomidine (PRECEDEX) IV infusion    . famotidine (PEPCID) IV Stopped (09/15/18 1314)  . norepinephrine (LEVOPHED) Adult infusion Stopped (09/15/18 1032)   PRN Meds: sodium chloride, sodium chloride, acetaminophen, fentaNYL (SUBLIMAZE) injection, haloperidol lactate, LORazepam, nitroGLYCERIN, ondansetron (ZOFRAN) IV, sodium chloride flush, sodium chloride flush   Vital Signs    Vitals:   09/15/18 1810 09/15/18 1900 09/15/18 2000 09/15/18 2100  BP: 130/76 114/62 99/67 100/69  Pulse: 80 80 80 80  Resp: 19 16 16 13   Temp:   99.6 F (37.6 C)   TempSrc:   Axillary   SpO2: 98% 100% 99% 100%  Weight:      Height:        Intake/Output Summary (Last 24 hours) at 09/15/2018 2125 Last data filed at 09/15/2018 1944 Gross per 24 hour  Intake 951.77 ml  Output 820 ml  Net 131.77 ml   Filed Weights   09/14/18 1138 09/15/18 0421  Weight: 97.5 kg 99.4 kg    Telemetry    V paced - Personally Reviewed  Physical Exam   GEN- The patient is sedated but extubated  Head- normocephalic, atraumatic Oropharynx- clear Neck- supple, Lungs- Clear to ausculation bilaterally, normal work of breathing Heart- Regular rate and rhythm (paced) GI- soft, NT, ND, + BS Extremities- no clubbing, cyanosis, or edema  MS- no significant deformity or  atrophy Skin- no rash or lesion Psych- euthymic mood, full affect Neuro- strength and sensation are intact   Labs    Chemistry Recent Labs  Lab 09/14/18 0741 09/14/18 0750 09/14/18 0823 09/14/18 1042 09/15/18 0251  NA 141 138 145  --  141  K 4.1 7.5* 3.7  --  4.7  CL 107 109 106  --  109  CO2 17*  --   --   --  23  GLUCOSE 161* 156* 155*  --  115*  BUN 17 26* 17  --  20  CREATININE 1.61* 1.50* 1.30* 1.13 1.54*  CALCIUM 8.7*  --   --   --  8.8*  PROT 6.2*  --   --   --   --   ALBUMIN 3.6  --   --   --   --   AST 33  --   --   --   --   ALT 27  --   --   --   --   ALKPHOS 48  --   --   --   --   BILITOT 0.9  --   --   --   --   GFRNONAA 45*  --   --  >60 47*  GFRAA 52*  --   --  >60 54*  ANIONGAP 17*  --   --   --  9     Hematology Recent Labs  Lab 09/14/18 0741  09/14/18 0823 09/14/18 1042 09/15/18 0905  WBC 11.6*  --   --  14.7* 13.6*  RBC 5.00  --   --  4.64 4.94  HGB 14.5   < > 14.3 13.1 13.9  HCT 46.0   < > 42.0 41.1 44.7  MCV 92.0  --   --  88.6 90.5  MCH 29.0  --   --  28.2 28.1  MCHC 31.5  --   --  31.9 31.1  RDW 14.6  --   --  14.5 14.5  PLT 263  --   --  246 171   < > = values in this interval not displayed.    Cardiac Enzymes Recent Labs  Lab 09/14/18 1042 09/14/18 1431 09/14/18 2214  TROPONINI 0.07* 0.20* 0.26*    Recent Labs  Lab 09/14/18 0748  TROPIPOC 0.00        Assessment & Plan    1.  Complete heart block Now extubated Confused/ agitated Hopefully this will improve overnight.  Will plan PPM tomorrow if he remains stable at that time.  Hillis Range MD, [facility] 09/15/2018 9:25 PM

## 2018-09-15 NOTE — Progress Notes (Signed)
Pt remains intubated and in complete heart block requiring pressor support.  EP will follow along and see again tomorrow  Gypsy Balsam, NP 09/15/2018 10:11 AM

## 2018-09-15 NOTE — Progress Notes (Signed)
Orthopedic Tech Progress Note Patient Details:  Billy Simon 12-Sep-1956 [id number]  Ortho Devices Type of Ortho Device: Knee Immobilizer Ortho Device/Splint Location: rle Ortho Device/Splint Interventions: Application   Post Interventions Patient Tolerated: Well Instructions Provided: Care of device   Nikki Dom 09/15/2018, 1:41 PM

## 2018-09-15 NOTE — Progress Notes (Signed)
Fentanyl gtt, remaining wasted in sink with Josiah Lobo RN Norva Karvonen, RN

## 2018-09-15 NOTE — Progress Notes (Addendum)
Progress Note  Patient Name: Yafet Cline Date of Encounter: 09/15/2018  Primary Cardiologist: No primary care provider on file.   Subjective   Sedated and intubated when seen this AM.   Inpatient Medications    Scheduled Meds: . benztropine  1 mg Oral Daily  . busPIRone  15 mg Oral BID  . chlorhexidine gluconate (MEDLINE KIT)  15 mL Mouth Rinse BID  . Chlorhexidine Gluconate Cloth  6 each Topical Q0600  . citalopram  40 mg Per Tube Daily  . haloperidol lactate      . heparin  5,000 Units Subcutaneous Q8H  . mouth rinse  15 mL Mouth Rinse 10 times per day  . mupirocin ointment  1 application Nasal BID  . risperiDONE  3 mg Per Tube BID  . sodium chloride flush  3 mL Intravenous Q12H  . sodium chloride flush  3 mL Intravenous Q12H   Continuous Infusions: . sodium chloride    . sodium chloride 10 mL/hr at 09/15/18 0900  . dexmedetomidine (PRECEDEX) IV infusion    . famotidine (PEPCID) IV    . magnesium sulfate 1 - 4 g bolus IVPB    . norepinephrine (LEVOPHED) Adult infusion 4 mcg/min (09/15/18 0900)   PRN Meds: sodium chloride, sodium chloride, acetaminophen, fentaNYL (SUBLIMAZE) injection, haloperidol lactate, LORazepam, nitroGLYCERIN, ondansetron (ZOFRAN) IV, sodium chloride flush, sodium chloride flush   Vital Signs    Vitals:   09/15/18 1030 09/15/18 1045 09/15/18 1100 09/15/18 1144  BP: (!) 131/109 94/70 90/68   Pulse: 80 80 80   Resp: _0 Temp:      TempSrc:      SpO2: 97% 97% 98% 90%  Weight:      Height:        Intake/Output Summary (Last 24 hours) at 09/15/2018 1149 Last data filed at 09/15/2018 0900 Gross per 24 hour  Intake 1898.68 ml  Output 925 ml  Net 973.68 ml   Filed Weights   09/14/18 1138 09/15/18 0421  Weight: 97.5 kg 99.4 kg    Telemetry    V-paced - Personally Reviewed  ECG    V-paced rhythm - Personally Reviewed  Physical Exam   GEN:  Intubated and sedated Neck: No JVD Cardiac: RRR, no murmurs, rubs, or gallops.    Respiratory: Clear to auscultation anteriorly. GI: Soft, nontender, non-distended  MS: No edema; No deformity. Neuro:  Nonfocal  Psych: Normal affect   Labs    Chemistry Recent Labs  Lab 09/14/18 0741 09/14/18 0750 09/14/18 0823 09/14/18 1042 09/15/18 0251  NA 141 138 145  --  141  K 4.1 7.5* 3.7  --  4.7  CL 107 109 106  --  109  CO2 17*  --   --   --  23  GLUCOSE 161* 156* 155*  --  115*  BUN 17 26* 17  --  20  CREATININE 1.61* 1.50* 1.30* 1.13 1.54*  CALCIUM 8.7*  --   --   --  8.8*  PROT 6.2*  --   --   --   --   ALBUMIN 3.6  --   --   --   --   AST 33  --   --   --   --   ALT 27  --   --   --   --   ALKPHOS 48  --   --   --   --   BILITOT 0.9  --   --   --   --  GFRNONAA 45*  --   --  >60 47*  GFRAA 52*  --   --  >60 54*  ANIONGAP 17*  --   --   --  9     Hematology Recent Labs  Lab 09/14/18 0741  09/14/18 0823 09/14/18 1042 09/15/18 0905  WBC 11.6*  --   --  14.7* 13.6*  RBC 5.00  --   --  4.64 4.94  HGB 14.5   < > 14.3 13.1 13.9  HCT 46.0   < > 42.0 41.1 44.7  MCV 92.0  --   --  88.6 90.5  MCH 29.0  --   --  28.2 28.1  MCHC 31.5  --   --  31.9 31.1  RDW 14.6  --   --  14.5 14.5  PLT 263  --   --  246 171   < > = values in this interval not displayed.    Cardiac Enzymes Recent Labs  Lab 09/14/18 1042 09/14/18 1431 09/14/18 2214  TROPONINI 0.07* 0.20* 0.26*    Recent Labs  Lab 09/14/18 0748  TROPIPOC 0.00     BNPNo results for input(s): BNP, PROBNP in the last 168 hours.   DDimer No results for input(s): DDIMER in the last 168 hours.   Radiology    Dg Chest Port 1 View  Result Date: 09/15/2018 CLINICAL DATA:  Evaluate ET tube EXAM: PORTABLE CHEST 1 VIEW COMPARISON:  09/14/2018 FINDINGS: The ET tube tip is above the carina. Heart size appears normal. No pleural effusion or edema. No airspace densities. Platelike atelectasis noted in the right lower lung. IMPRESSION: 1. Satisfactory position of ET tube with tip above carina. 2. Right  lower lung platelike atelectasis. Electronically Signed   By: Kerby Moors M.D.   On: 09/15/2018 10:25   Dg Chest Port 1 View  Result Date: 09/14/2018 CLINICAL DATA:  Respiratory failure, syncopal episode, CHF, intubated patient. EXAM: PORTABLE CHEST 1 VIEW COMPARISON:  Portable chest x-ray of September 14, 2018 at 7:42 a.m. FINDINGS: The lungs are mildly hypoinflated. The interstitial markings are coarse especially at the lung bases. There is no pleural effusion or pneumothorax. The cardiac silhouette is mildly enlarged. The pulmonary vascularity is less engorged. The endotracheal tube tip lies at the level of the inferior margin of the clavicular heads which is approximately 3.4 cm above the carina. IMPRESSION: Bibasilar subsegmental atelectasis or less likely pneumonia. Mild prominence of the central pulmonary vascularity which has improved since earlier today. Stable mild cardiomegaly. The endotracheal tube is in reasonable position.  Low low low Electronically Signed   By: David  Martinique M.D.   On: 09/14/2018 10:46   Dg Chest Portable 1 View  Result Date: 09/14/2018 CLINICAL DATA:  post intubation/ETT PLACEMENT EXAM: PORTABLE CHEST - 1 VIEW COMPARISON:  none FINDINGS: Endotracheal tube tip 5.4 cm above carina. Bibasilar and left perihilar interstitial prominence, may be related to bronchovascular crowding secondary to low lung volumes. No confluent airspace disease. Heart size upper limits normal for technique. No effusion.  No pneumothorax. Visualized bones unremarkable. IMPRESSION: 1. Good position of endotracheal tube tip. 2. Low lung volumes with bibasilar and left perihilar interstitial prominence/atelectasis Electronically Signed   By: Lucrezia Europe M.D.   On: 09/14/2018 08:14    Cardiac Studies   Echocardiogram 09/14/2018: Study Conclusions  - Left ventricle: There was mild focal basal hypertrophy of the   septum. Systolic function was normal. The estimated ejection   fraction was in the  range  of 60% to 65%. The study is not   technically sufficient to allow evaluation of LV diastolic   function. Cannot exclude thrombus. - Ventricular septum: Septal motion showed paradox. These changes   are consistent with right ventricular pacing.  LHC 09/14/2018:   The left ventricular systolic function is normal.  LV end diastolic pressure is mildly elevated.  The left ventricular ejection fraction is 55-65% by visual estimate.   1. Normal coronary anatomy 2. Normal LV function 3. Mildly elevated LVEDP 19 mm Hg 4. Successful placement of temporary transvenous pacemaker.  No indication for antiplatelet therapy at this time.  Patient Profile     62 y.o. male with history of recent admission for a syncopal event thought to be secondary to vasovagal episode who presented after a syncopal event at home was found to be in complete heart block.  Assessment & Plan    1. Complete heart block: Mr. Gulas presented after a syncopal event at home that was not preceded by any prodromal symptoms.  He was found to be in complete heart block by EMS.  This is likely be secondary to primary conduction system disease as he underwent LHC yesterday that showed normal coronary arteries and normal LV function.  He also has a strong family history of close relatives who are pacemaker dependent since young age.  He underwent temporary TV pacemaker yesterday and is currently doing well.  Required pressure support on arrival but now off of Levophed with appropriate MAP. EP consulted for permanent pacemaker, but they would like for patient to be respiratory and hemodynamically stable before proceeding with procedure.  They will continue to follow. Ultimately, plan will be for permanent pacemaker placement as this is very unlikely to be a transient episode of heart block given recent history of syncopal events.  I suspect he will be able to do this this week as he is already extubated and off pressors.  2.  Acute hypoxic respiratory failure: He was intubated on arrival to the ED due to inability to protect airway.  This morning when seen he was sedated and intubated, but has been successfully extubated the afternoon and appears to be doing well on 4 L of supplemental oxygen.  He has also been weaned off sedation.  3. AKI: No known history of renal disease. Cr 1.61 on admission.  He received 1 dose of IV Lasix 40 yesterday with improvement in creatinine.  We will continue IV Lasix 20 daily with daily monitoring of electrolytes.  For questions or updates, please contact [place] Please consult [email or web address] for contact info under      Signed, Welford Roche, MD  09/15/2018, 11:49 AM     Agree with note by Dr. Frederico Hamman  Mr. Maurie Boettcher presented with complete heart block.  He has had syncope documented in June of this year without etiology.  He had acute respiratory failure requiring intubation, cardiac catheterization by Dr. Martinique revealing normal coronary arteries, normal LV function.  A temporary transvenous pacemaker was placed.  His ventilatory status is being monitored by PCCM.  He is on low-dose pressors.  He is being sedated.  His exam is benign.  Dr. Rayann Heman of the EP service is following as well.  Plan is for placement of a permanent transvenous pacemaker once he is more alert.  He he was extubated today.  Lorretta Harp, M.D., [place], [facility], [place] [address] [place], [place]  [id number]  [phone] 09/15/2018 3:03 PM

## 2018-09-15 NOTE — Consult Note (Signed)
PULMONARY / CRITICAL CARE MEDICINE   NAME:  Billy Simon, MRN:  [id number], DOB:  1956/11/07, LOS: 1 ADMISSION DATE:  09/14/2018, CONSULTATION DATE: 09/14/2018  REFERRING MD: Cardiology, CHIEF COMPLAINT: Complete heart block  BRIEF HISTORY:    62 year old with complete heart block required a cath and temporary pacer insertion on 09/14/2018 HISTORY OF PRESENT ILLNESS   62 year old with a history of syncope who presented with complete heart block.  He required intubation and transferred to Cath Lab which time he underwent a right femoral temporary pacing wire placement.  On presentation Cath Lab was noted to be hypoxic hypercarbic and acidotic.  And have frothy bloody sputum from his endotracheal tube.  He was on 100% FiO2. Therefore he was not extubated but placed on weaning protocol sedated for tube tolerance chest x-ray and arterial blood gases were ordered to monitor his current condition.  SIGNIFICANT PAST MEDICAL HISTORY   Depression and anxiety  SIGNIFICANT EVENTS:  01/15/2018 admitted with complete heart block requiring temporary pacer insertion and intubation.  STUDIES:   09/15/1999 nineteen 2D echo>>  CULTURES:    ANTIBIOTICS:    LINES/TUBES:  09/14/2018 endotracheal tube>>10/16 09/14/2018 right femoral pacing were temporary>>  CONSULTANTS:  09/14/2018 pulmonary critical care  SUBJECTIVE:  Patient very agitated this AM, was sedated  CONSTITUTIONAL: BP 100/67   Pulse 80   Temp 99.3 F (37.4 C) (Oral)   Resp 16   Ht 6' (1.829 m)   Wt 99.4 kg   SpO2 98%   BMI 29.72 kg/m   I/O last 3 completed shifts: In: 1721.3 [I.V.:1721.3] Out: 2235 [Urine:2235]   Vent Mode: PRVC FiO2 (%):  [40 %-60 %] 40 % Set Rate:  [16 bmp] 16 bmp Vt Set:  [620 mL] 620 mL PEEP:  [5 cmH20-8 cmH20] 8 cmH20 Plateau Pressure:  [18 cmH20-21 cmH20] 20 cmH20  PHYSICAL EXAM: General: Acutely ill appearing, NAD Neuro: Sedate this AM but was very agitated earlier and moving all ext to  commands HEENT: [place]/AT, PERRL, EOM-I and MMM Cardiovascular: Paced, regular, Nl S1/S2 and -M/R/G Lungs: CTA bilaterally Abdomen: Soft, NT, ND and +BS Musculoskeletal: Intact, right leg in a brace Skin: Warm and dry  RESOLVED PROBLEM LIST   ASSESSMENT AND PLAN   Vent dependent respiratory failure in the setting of complete heart block complicated by hypoxia and acidosis. Patient has severe anxiety and is either completely sedate or very agitated and pulling on ETT Was able to take in 1400 ml on 5/5 with FiO2 of 40% Plan to wake up and extubate today since no pacer is to be placed today, if fails then will reintubate  Complete heart block status post right femoral pacing wire placed 09/14/2018 with plan for permanent pacemaker near future Plans for permanent pacemaker per cardiology but not today 2D echo per cards Per cardiology Hold additional lasix today given CVP of 6 and improved O2 demant  Hypotension Levophed for BP support, anticipate as sedation decreases that BP will improve and can d/c levophed and improve renal function  Mild renal insufficiency Lab Results  Component Value Date   CREATININE 1.54 (H) 09/15/2018   CREATININE 1.13 09/14/2018   CREATININE 1.30 (H) 09/14/2018  Post cardiac cath 09/14/2018 Avoid nephrotoxic Hold on any diuresis at this time.  History of depression and anxiety Restart all home anxiolytic Fentanyl to IV PRN for pain PRN ativan IV  SUMMARY OF TODAY'S PLAN:  No pacer to be placed today, patient is extubatable, will add home psych medications, PRN fentanyl and ativan  ordered.  Discussed with Dr. Johney Frame he is to evaluate patient and answer family's questions  Best Practice / Goals of Care / Disposition.   DVT PROPHYLAXIS: Lovenox SUP: Pepcid NUTRITION: N.p.o. MOBILITY: Bedrest GOALS OF CARE: Code full FAMILY DISCUSSIONS: 18 2019 family updated bedside DISPOSITION remains intensive care unit following temporary pacemaker wire  LABS   Glucose No results for input(s): GLUCAP in the last 168 hours.  BMET Recent Labs  Lab 09/14/18 0741 09/14/18 0750 09/14/18 0823 09/14/18 1042 09/15/18 0251  NA 141 138 145  --  141  K 4.1 7.5* 3.7  --  4.7  CL 107 109 106  --  109  CO2 17*  --   --   --  23  BUN 17 26* 17  --  20  CREATININE 1.61* 1.50* 1.30* 1.13 1.54*  GLUCOSE 161* 156* 155*  --  115*    Liver Enzymes Recent Labs  Lab 09/14/18 0741  AST 33  ALT 27  ALKPHOS 48  BILITOT 0.9  ALBUMIN 3.6    Electrolytes Recent Labs  Lab 09/14/18 0741 09/14/18 1042 09/15/18 0251  CALCIUM 8.7*  --  8.8*  MG  --  1.3* 1.7  PHOS  --   --  3.8    CBC Recent Labs  Lab 09/14/18 0741  09/14/18 0823 09/14/18 1042 09/15/18 0905  WBC 11.6*  --   --  14.7* 13.6*  HGB 14.5   < > 14.3 13.1 13.9  HCT 46.0   < > 42.0 41.1 44.7  PLT 263  --   --  246 171   < > = values in this interval not displayed.    ABG Recent Labs  Lab 09/14/18 0822 09/14/18 1151 09/15/18 0325  PHART 7.291* 7.391 7.339*  PCO2ART 46.9 42.4 47.9  PO2ART 125.0* 272.0* 161*    Coag's Recent Labs  Lab 09/14/18 0741  INR 1.17    Sepsis Markers No results for input(s): LATICACIDVEN, PROCALCITON, O2SATVEN in the last 168 hours.  Cardiac Enzymes Recent Labs  Lab 09/14/18 1042 09/14/18 1431 09/14/18 2214  TROPONINI 0.07* 0.20* 0.26*   The patient is critically ill with multiple organ systems failure and requires high complexity decision making for assessment and support, frequent evaluation and titration of therapies, application of advanced monitoring technologies and extensive interpretation of multiple databases.   Critical Care Time devoted to patient care services described in this note is  36  Minutes. This time reflects time of care of this signee Dr Koren Bound. This critical care time does not reflect procedure time, or teaching time or supervisory time of PA/NP/Med student/Med Resident etc but could involve care discussion  time.  Alyson Reedy, M.D. [facility]. Pager: [phone]. After hours pager: [phone].

## 2018-09-16 ENCOUNTER — Inpatient Hospital Stay (HOSPITAL_COMMUNITY): Payer: PRIVATE HEALTH INSURANCE

## 2018-09-16 ENCOUNTER — Inpatient Hospital Stay (HOSPITAL_COMMUNITY): Payer: PRIVATE HEALTH INSURANCE | Admitting: Certified Registered Nurse Anesthetist

## 2018-09-16 ENCOUNTER — Inpatient Hospital Stay (HOSPITAL_COMMUNITY)
Admission: EM | Disposition: A | Discharge status: Rehabilitation Facility | Payer: Self-pay | Source: Home / Self Care | Attending: Internal Medicine

## 2018-09-16 ENCOUNTER — Encounter (HOSPITAL_COMMUNITY): Payer: Self-pay | Admitting: Cardiology

## 2018-09-16 DIAGNOSIS — I442 Atrioventricular block, complete: Secondary | ICD-10-CM

## 2018-09-16 HISTORY — PX: PACEMAKER IMPLANT: EP1218

## 2018-09-16 LAB — POCT I-STAT 3, ART BLOOD GAS (G3+)
Acid-Base Excess: 1 mmol/L (ref 0.0–2.0)
BICARBONATE: 25.5 mmol/L (ref 20.0–28.0)
Bicarbonate: 23.7 mmol/L (ref 20.0–28.0)
O2 SAT: 99 %
O2 Saturation: 92 %
TCO2: 27 mmol/L (ref 22–32)
TCO2: 25 mmol/L (ref 22–32)
pCO2 arterial: 42 mmHg (ref 32.0–48.0)
pCO2 arterial: 36.5 mmHg (ref 32.0–48.0)
pH, Arterial: 7.396 (ref 7.350–7.450)
pH, Arterial: 7.42 (ref 7.350–7.450)
pO2, Arterial: 69 mmHg — ABNORMAL LOW (ref 83.0–108.0)
pO2, Arterial: 131 mmHg — ABNORMAL HIGH (ref 83.0–108.0)

## 2018-09-16 LAB — CBC
HCT: 41.8 % (ref 39.0–52.0)
Hemoglobin: 13.4 g/dL (ref 13.0–17.0)
MCH: 28.8 pg (ref 26.0–34.0)
MCHC: 32.1 g/dL (ref 30.0–36.0)
MCV: 89.7 fL (ref 80.0–100.0)
PLATELETS: 158 10*3/uL (ref 150–400)
RBC: 4.66 MIL/uL (ref 4.22–5.81)
RDW: 14.1 % (ref 11.5–15.5)
WBC: 16.2 10*3/uL — ABNORMAL HIGH (ref 4.0–10.5)
nRBC: 0 % (ref 0.0–0.2)

## 2018-09-16 LAB — GLUCOSE, CAPILLARY: Glucose-Capillary: 122 mg/dL — ABNORMAL HIGH (ref 70–99)

## 2018-09-16 LAB — BASIC METABOLIC PANEL
ANION GAP: 13 (ref 5–15)
BUN: 20 mg/dL (ref 8–23)
CALCIUM: 8.4 mg/dL — AB (ref 8.9–10.3)
CO2: 22 mmol/L (ref 22–32)
Chloride: 108 mmol/L (ref 98–111)
Creatinine, Ser: 1.47 mg/dL — ABNORMAL HIGH (ref 0.61–1.24)
GFR, EST AFRICAN AMERICAN: 58 mL/min — AB (ref >60)
GFR, EST NON AFRICAN AMERICAN: 50 mL/min — AB (ref >60)
GLUCOSE: 113 mg/dL — AB (ref 70–99)
Potassium: 4 mmol/L (ref 3.5–5.1)
Sodium: 143 mmol/L (ref 135–145)

## 2018-09-16 LAB — POCT I-STAT, CHEM 8
BUN: 26 mg/dL — ABNORMAL HIGH (ref 8–23)
CREATININE: 1.5 mg/dL — AB (ref 0.61–1.24)
Calcium, Ion: 1.01 mmol/L — ABNORMAL LOW (ref 1.15–1.40)
Chloride: 109 mmol/L (ref 98–111)
Glucose, Bld: 156 mg/dL — ABNORMAL HIGH (ref 70–99)
HCT: 45 % (ref 39.0–52.0)
HEMOGLOBIN: 15.3 g/dL (ref 13.0–17.0)
POTASSIUM: 7.5 mmol/L — AB (ref 3.5–5.1)
SODIUM: 138 mmol/L (ref 135–145)
TCO2: 20 mmol/L — AB (ref 22–32)

## 2018-09-16 LAB — MAGNESIUM: MAGNESIUM: 1.8 mg/dL (ref 1.7–2.4)

## 2018-09-16 LAB — PROCALCITONIN: PROCALCITONIN: 0.25 ng/mL

## 2018-09-16 LAB — PHOSPHORUS: PHOSPHORUS: 3 mg/dL (ref 2.5–4.6)

## 2018-09-16 SURGERY — PACEMAKER IMPLANT
Anesthesia: General

## 2018-09-16 MED ORDER — SODIUM CHLORIDE 0.9 % IV SOLN
INTRAVENOUS | Status: DC | PRN
  Administered 2018-09-16: 13:00:00

## 2018-09-16 MED ORDER — PHENYLEPHRINE HCL 10 MG/ML IJ SOLN
INTRAMUSCULAR | Status: DC | PRN
  Administered 2018-09-16 (×2): 80 ug via INTRAVENOUS
  Administered 2018-09-16: 40 ug via INTRAVENOUS

## 2018-09-16 MED ORDER — MIDAZOLAM HCL 2 MG/2ML IJ SOLN
INTRAMUSCULAR | Status: AC
  Filled 2018-09-16: qty 2

## 2018-09-16 MED ORDER — CHLORHEXIDINE GLUCONATE 4 % EX LIQD: 60.0000 mL | Freq: Once | CUTANEOUS | Status: DC

## 2018-09-16 MED ORDER — LACTATED RINGERS IV SOLN
INTRAVENOUS | Status: DC | PRN
  Administered 2018-09-16: 11:00:00 via INTRAVENOUS

## 2018-09-16 MED ORDER — RISPERIDONE 2 MG PO TBDP
6.0000 mg | ORAL_TABLET | Freq: Every day | ORAL | Status: DC
  Administered 2018-09-17: 6 mg via SUBLINGUAL
  Filled 2018-09-16 (×3): qty 3

## 2018-09-16 MED ORDER — CEFAZOLIN SODIUM-DEXTROSE 1-4 GM/50ML-% IV SOLN
1.0000 g | Freq: Four times a day (QID) | INTRAVENOUS | Status: AC
  Administered 2018-09-16 – 2018-09-17 (×3): 1 g via INTRAVENOUS
  Filled 2018-09-16 (×3): qty 50

## 2018-09-16 MED ORDER — FENTANYL CITRATE (PF) 250 MCG/5ML IJ SOLN
INTRAMUSCULAR | Status: AC
  Filled 2018-09-16: qty 5

## 2018-09-16 MED ORDER — KETAMINE HCL 50 MG/5ML IJ SOSY
PREFILLED_SYRINGE | INTRAMUSCULAR | Status: AC
  Filled 2018-09-16: qty 10

## 2018-09-16 MED ORDER — SUCCINYLCHOLINE CHLORIDE 20 MG/ML IJ SOLN
INTRAMUSCULAR | Status: DC | PRN
  Administered 2018-09-16: 100 mg via INTRAVENOUS

## 2018-09-16 MED ORDER — SODIUM CHLORIDE 0.9 % IV SOLN
INTRAVENOUS | Status: AC
  Filled 2018-09-16: qty 2

## 2018-09-16 MED ORDER — PROPOFOL 10 MG/ML IV BOLUS
INTRAVENOUS | Status: AC
  Filled 2018-09-16: qty 20

## 2018-09-16 MED ORDER — SODIUM CHLORIDE 0.9 % IV SOLN: 80.0000 mg | INTRAVENOUS | Status: DC

## 2018-09-16 MED ORDER — CEFAZOLIN SODIUM-DEXTROSE 2-4 GM/100ML-% IV SOLN
INTRAVENOUS | Status: AC
  Filled 2018-09-16: qty 100

## 2018-09-16 MED ORDER — HEPARIN (PORCINE) IN NACL 1000-0.9 UT/500ML-% IV SOLN
INTRAVENOUS | Status: DC | PRN
  Administered 2018-09-16: 500 mL

## 2018-09-16 MED ORDER — LIDOCAINE HCL 1 % IJ SOLN
INTRAMUSCULAR | Status: AC
  Filled 2018-09-16: qty 60

## 2018-09-16 MED ORDER — DEXMEDETOMIDINE HCL IN NACL 200 MCG/50ML IV SOLN
INTRAVENOUS | Status: AC
  Filled 2018-09-16: qty 50

## 2018-09-16 MED ORDER — ROCURONIUM BROMIDE 100 MG/10ML IV SOLN
INTRAVENOUS | Status: DC | PRN
  Administered 2018-09-16 (×2): 50 mg via INTRAVENOUS

## 2018-09-16 MED ORDER — PROPOFOL 10 MG/ML IV BOLUS
INTRAVENOUS | Status: DC | PRN
  Administered 2018-09-16: 200 mg via INTRAVENOUS

## 2018-09-16 MED ORDER — LIDOCAINE HCL (PF) 1 % IJ SOLN
INTRAMUSCULAR | Status: DC | PRN
  Administered 2018-09-16: 50 mL

## 2018-09-16 MED ORDER — DEXMEDETOMIDINE HCL IN NACL 400 MCG/100ML IV SOLN
0.4000 ug/kg/h | INTRAVENOUS | Status: DC
  Administered 2018-09-16: 0.7 ug/kg/h via INTRAVENOUS
  Administered 2018-09-16 (×2): 0.4 ug/kg/h via INTRAVENOUS
  Administered 2018-09-17: 0.7 ug/kg/h via INTRAVENOUS
  Administered 2018-09-17 (×3): 1.2 ug/kg/h via INTRAVENOUS
  Administered 2018-09-17: 1.1 ug/kg/h via INTRAVENOUS
  Administered 2018-09-17: 1 ug/kg/h via INTRAVENOUS
  Administered 2018-09-18: 1.2 ug/kg/h via INTRAVENOUS
  Administered 2018-09-18: 1.799 ug/kg/h via INTRAVENOUS
  Administered 2018-09-18: 1.8 ug/kg/h via INTRAVENOUS
  Administered 2018-09-18 (×2): 1.2 ug/kg/h via INTRAVENOUS
  Administered 2018-09-18: 1.8 ug/kg/h via INTRAVENOUS
  Administered 2018-09-18: 1.1 ug/kg/h via INTRAVENOUS
  Administered 2018-09-18 (×2): 1.2 ug/kg/h via INTRAVENOUS
  Administered 2018-09-19 (×5): 1.8 ug/kg/h via INTRAVENOUS
  Administered 2018-09-19: 1.2 ug/kg/h via INTRAVENOUS
  Administered 2018-09-19 – 2018-09-21 (×11): 1.8 ug/kg/h via INTRAVENOUS
  Administered 2018-09-21: 0.8 ug/kg/h via INTRAVENOUS
  Administered 2018-09-21 (×4): 1.2 ug/kg/h via INTRAVENOUS
  Administered 2018-09-21 (×2): 1.8 ug/kg/h via INTRAVENOUS
  Administered 2018-09-22 (×2): 1.2 ug/kg/h via INTRAVENOUS
  Filled 2018-09-16 (×48): qty 100

## 2018-09-16 MED ORDER — CEFAZOLIN SODIUM-DEXTROSE 2-3 GM-%(50ML) IV SOLR
INTRAVENOUS | Status: DC | PRN
  Administered 2018-09-16: 2 g via INTRAVENOUS

## 2018-09-16 MED ORDER — SODIUM CHLORIDE 0.9 % IV SOLN
INTRAVENOUS | Status: DC
  Administered 2018-09-16 – 2018-09-20 (×4): via INTRAVENOUS

## 2018-09-16 MED ORDER — CEFAZOLIN SODIUM-DEXTROSE 2-4 GM/100ML-% IV SOLN: 2.0000 g | INTRAVENOUS | Status: DC

## 2018-09-16 MED ORDER — LIDOCAINE HCL (CARDIAC) PF 100 MG/5ML IV SOSY
PREFILLED_SYRINGE | INTRAVENOUS | Status: DC | PRN
  Administered 2018-09-16: 100 mg via INTRAVENOUS

## 2018-09-16 MED ORDER — RISPERIDONE 2 MG PO TBDP: 6.0000 mg | ORAL_TABLET | Freq: Every day | ORAL | Status: DC

## 2018-09-16 MED ORDER — MIDAZOLAM HCL 5 MG/5ML IJ SOLN
INTRAMUSCULAR | Status: DC | PRN
  Administered 2018-09-16: 2 mg via INTRAVENOUS

## 2018-09-16 MED ORDER — FENTANYL CITRATE (PF) 250 MCG/5ML IJ SOLN
INTRAMUSCULAR | Status: DC | PRN
  Administered 2018-09-16: 50 ug via INTRAVENOUS

## 2018-09-16 MED ORDER — HEPARIN (PORCINE) IN NACL 1000-0.9 UT/500ML-% IV SOLN
INTRAVENOUS | Status: AC
  Filled 2018-09-16: qty 500

## 2018-09-16 MED ORDER — SODIUM CHLORIDE 0.9 % IV SOLN
INTRAVENOUS | Status: DC | PRN
  Administered 2018-09-16: 30 ug/min via INTRAVENOUS

## 2018-09-16 SURGICAL SUPPLY — 9 items
BLANKET WARM UNDERBOD FULL ACC (MISCELLANEOUS) ×3 IMPLANT
CABLE SURGICAL S-101-97-12 (CABLE) ×3 IMPLANT
HOVERMATT SINGLE USE (MISCELLANEOUS) ×3 IMPLANT
LEAD TENDRIL MRI 52CM LPA1200M (Lead) ×3 IMPLANT
LEAD TENDRIL MRI 58CM LPA1200M (Lead) ×3 IMPLANT
PACEMAKER ASSURITY DR-RF (Pacemaker) ×3 IMPLANT
PAD PRO RADIOLUCENT 2001M-C (PAD) ×3 IMPLANT
SHEATH CLASSIC 8F (SHEATH) ×6 IMPLANT
TRAY PACEMAKER INSERTION (PACKS) ×3 IMPLANT

## 2018-09-16 NOTE — Progress Notes (Addendum)
PCCM interval note  Examined after return from pacemaker insertion Sedated on the vent  ABG is acceptable, CT head reviewed with no acute abnormality. Noted to have low-grade fevers, elevated WBC count of 16 He is already on Ancef for surgical prophylaxis We will check blood cultures and procalcitonin Wake up and start weaning trials tomorrow morning.  Family updated at bedside  Additional CC time - 35 mins Chilton Greathouse MD [facility] 09/16/2018, 4:34 PM

## 2018-09-16 NOTE — Anesthesia Postprocedure Evaluation (Signed)
Anesthesia Post Note  Patient: Billy Simon  Procedure(s) Performed: PACEMAKER IMPLANT (N/A )     Patient location during evaluation: SICU Anesthesia Type: General Level of consciousness: sedated Pain management: pain level controlled Vital Signs Assessment: post-procedure vital signs reviewed and stable Respiratory status: patient remains intubated per anesthesia plan Cardiovascular status: stable Postop Assessment: no apparent nausea or vomiting Anesthetic complications: no    Last Vitals:  Vitals:   09/16/18 1608 09/16/18 1700  BP:  140/78  Pulse:  69  Resp:  19  Temp:    SpO2: 97% 98%    Last Pain:  Vitals:   09/16/18 0500  TempSrc: Axillary                 Kennieth Rad

## 2018-09-16 NOTE — Transfer of Care (Signed)
Immediate Anesthesia Transfer of Care Note  Patient: Billy Simon  Procedure(s) Performed: PACEMAKER IMPLANT (N/A )  Patient Location: ICU  Anesthesia Type:General  Level of Consciousness: sedated and Patient remains intubated per anesthesia plan  Airway & Oxygen Therapy: Patient remains intubated per anesthesia plan and Patient placed on Ventilator (see vital sign flow sheet for setting)  Post-op Assessment: Report given to RN and Post -op Vital signs reviewed and stable  BP 110/73; HR 80 paced. spo2 97% oett  Post vital signs: Reviewed and stable  Last Vitals:  Vitals Value Taken Time  BP    Temp    Pulse 69 09/16/2018  1:57 PM  Resp 18 09/16/2018  1:57 PM  SpO2 99 % 09/16/2018  1:57 PM  Vitals shown include unvalidated device data.  Last Pain:  Vitals:   09/16/18 0500  TempSrc: Axillary         Complications: No apparent anesthesia complications

## 2018-09-16 NOTE — Progress Notes (Signed)
[facility] Deter[facility] notified of patients increasing agitation and confusion. Orders received. Will begin Precidex.

## 2018-09-16 NOTE — Anesthesia Procedure Notes (Signed)
Procedure Name: Intubation Date/Time: 09/16/2018 11:31 AM Performed by: Shirlyn Goltz, CRNA Pre-anesthesia Checklist: Patient identified, Emergency Drugs available, Patient being monitored and Suction available Patient Re-evaluated:Patient Re-evaluated prior to induction Oxygen Delivery Method: Circle system utilized Preoxygenation: Pre-oxygenation with 100% oxygen Induction Type: IV induction Ventilation: Mask ventilation without difficulty Laryngoscope Size: Mac and 4 Grade View: Grade II Tube type: Oral Tube size: 7.5 mm Number of attempts: 1 Airway Equipment and Method: Stylet Placement Confirmation: ETT inserted through vocal cords under direct vision,  positive ETCO2 and breath sounds checked- equal and bilateral Secured at: 23 cm Tube secured with: Tape Dental Injury: Teeth and Oropharynx as per pre-operative assessment

## 2018-09-16 NOTE — Progress Notes (Signed)
SLP Cancellation Note  Patient Details Name: Billy Simon MRN: [id number] DOB: [birth year]   Cancelled treatment:       Reason Eval/Treat Not Completed: Medical issues which prohibited therapy. Requiring sedation for agitation.   Ferdinand Lango MA, CCC-SLP     Taran Hable Meryl 09/16/2018, 9:29 AM

## 2018-09-16 NOTE — Anesthesia Preprocedure Evaluation (Addendum)
Anesthesia Evaluation  Patient identified by MRN, date of birth, ID band Patient awake    Reviewed: Allergy & Precautions, NPO status , Patient's Chart, lab work & pertinent test results  Airway Mallampati: II  TM Distance: >3 FB Neck ROM: Full    Dental  (+) Dental Advisory Given   Pulmonary neg pulmonary ROS,    breath sounds clear to auscultation       Cardiovascular + dysrhythmias (complete heart block)  Rhythm:Regular Rate:Normal  Echocardiogram 09/14/2018: Study Conclusions  - Left ventricle: There was mild focal basal hypertrophy of theseptum. Systolic function was normal. The estimated ejectionfraction was in the range of 60% to 65%. The study is nottechnically sufficient to allow evaluation of LV diastolicfunction. Cannot exclude thrombus. - Ventricular septum: Septal motion showed paradox. These changesare consistent with right ventricular pacing.  LHC 09/14/2018:   The left ventricular systolic function is normal.  LV end diastolic pressure is mildly elevated.  The left ventricular ejection fraction is 55-65% by visual estimate.  1. Normal coronary anatomy 2. Normal LV function 3. Mildly elevated LVEDP 19 mm Hg 4. Successful placement of temporary transvenous pacemaker.   Neuro/Psych PSYCHIATRIC DISORDERS Severe agitation and delerium    GI/Hepatic negative GI ROS, Neg liver ROS,   Endo/Other  negative endocrine ROS  Renal/GU Renal InsufficiencyRenal disease     Musculoskeletal   Abdominal   Peds  Hematology negative hematology ROS (+)   Anesthesia Other Findings   Reproductive/Obstetrics                            Lab Results  Component Value Date   WBC 16.2 (H) 09/16/2018   HGB 13.4 09/16/2018   HCT 41.8 09/16/2018   MCV 89.7 09/16/2018   PLT 158 09/16/2018   Lab Results  Component Value Date   CREATININE 1.47 (H) 09/16/2018   BUN 20 09/16/2018   NA  143 09/16/2018   K 4.0 09/16/2018   CL 108 09/16/2018   CO2 22 09/16/2018    Anesthesia Physical Anesthesia Plan  ASA: III  Anesthesia Plan: General   Post-op Pain Management:    Induction: Intravenous  PONV Risk Score and Plan: 2 and Ondansetron, Dexamethasone and Treatment may vary due to age or medical condition  Airway Management Planned: Oral ETT  Additional Equipment:   Intra-op Plan:   Post-operative Plan: Post-operative intubation/ventilation  Informed Consent: I have reviewed the patients History and Physical, chart, labs and discussed the procedure including the risks, benefits and alternatives for the proposed anesthesia with the patient or authorized representative who has indicated his/her understanding and acceptance.   Dental advisory given  Plan Discussed with: CRNA  Anesthesia Plan Comments:        Anesthesia Quick Evaluation

## 2018-09-16 NOTE — Progress Notes (Signed)
Patient returned from cath lab intubated.  Placed on ventilator and is currently tolerating well.

## 2018-09-16 NOTE — Progress Notes (Signed)
Patient remains extremely agitated and confused. Patient sits himself up in bed despite restraints and continues to attempt to bend leg with pacer. Patient unable to reorient. Ativan helps briefly with agitation. Patients son expressed concern about patient becoming addicted to Ativan. Patients son educated. Will continue to monitor and attempt to reorient patient.

## 2018-09-16 NOTE — Progress Notes (Addendum)
Electrophysiology Rounding Note  Patient Name: Billy Simon Date of Encounter: 09/16/2018  Electrophysiologist: allred   Subjective   The patient is sedated. Family at bedside.   Inpatient Medications    Scheduled Meds: . benztropine  1 mg Oral Daily  . busPIRone  15 mg Oral BID  . Chlorhexidine Gluconate Cloth  6 each Topical Q0600  . citalopram  40 mg Per Tube Daily  . heparin  5,000 Units Subcutaneous Q8H  . mouth rinse  15 mL Mouth Rinse BID  . mupirocin ointment  1 application Nasal BID  . risperiDONE  3 mg Per Tube BID  . sodium chloride flush  3 mL Intravenous Q12H  . sodium chloride flush  3 mL Intravenous Q12H   Continuous Infusions: . sodium chloride    . sodium chloride Stopped (09/15/18 2155)  . dexmedetomidine (PRECEDEX) IV infusion 0.4 mcg/kg/hr (09/16/18 0845)  . famotidine (PEPCID) IV 20 mg (09/16/18 0848)  . norepinephrine (LEVOPHED) Adult infusion Stopped (09/15/18 1032)   PRN Meds: sodium chloride, sodium chloride, acetaminophen, fentaNYL (SUBLIMAZE) injection, haloperidol lactate, nitroGLYCERIN, ondansetron (ZOFRAN) IV, sodium chloride flush, sodium chloride flush   Vital Signs    Vitals:   09/16/18 0600 09/16/18 0700 09/16/18 0800 09/16/18 0900  BP: 96/62 95/66 97/66  95/64  Pulse: 80 80 80 80  Resp: 20 (!) 24 19 (!) 22  Temp:   100.2 F (37.9 C)   TempSrc:      SpO2: 99% 98% 99% 99%  Weight:      Height:        Intake/Output Summary (Last 24 hours) at 09/16/2018 0910 Last data filed at 09/16/2018 0900 Gross per 24 hour  Intake 381.98 ml  Output 930 ml  Net -548.02 ml   Filed Weights   09/14/18 1138 09/15/18 0421 09/16/18 0408  Weight: 97.5 kg 99.4 kg 99.2 kg    Physical Exam    GEN- The patient is sedated Head- normocephalic, atraumatic Eyes-  Sclera clear, conjunctiva pink Ears- hearing intact Oropharynx- clear Neck- supple Lungs- Clear to ausculation bilaterally, normal work of breathing Heart- Regular rate and rhythm  (paced) GI- soft, NT, ND, + BS Extremities- no clubbing, cyanosis, or edema, +temp wire right groin Skin- no rash or lesion Psych- euthymic mood, full affect Neuro- strength and sensation are intact  Labs    CBC Recent Labs    09/14/18 0741  09/15/18 0905 09/16/18 0806  WBC 11.6*   < > 13.6* 16.2*  NEUTROABS 7.0  --  10.1*  --   HGB 14.5   < > 13.9 13.4  HCT 46.0   < > 44.7 41.8  MCV 92.0   < > 90.5 89.7  PLT 263   < > 171 158   < > = values in this interval not displayed.   Basic Metabolic Panel Recent Labs    16/10/96 0251 09/16/18 0418  NA 141 143  K 4.7 4.0  CL 109 108  CO2 23 22  GLUCOSE 115* 113*  BUN 20 20  CREATININE 1.54* 1.47*  CALCIUM 8.8* 8.4*  MG 1.7 1.8  PHOS 3.8 3.0   Liver Function Tests Recent Labs    09/14/18 0741  AST 33  ALT 27  ALKPHOS 48  BILITOT 0.9  PROT 6.2*  ALBUMIN 3.6   No results for input(s): LIPASE, AMYLASE in the last 72 hours. Cardiac Enzymes Recent Labs    09/14/18 1042 09/14/18 1431 09/14/18 2214  TROPONINI 0.07* 0.20* 0.26*   Thyroid Function Tests  Recent Labs    09/14/18 0741  TSH 12.490*    Telemetry    V pacing (personally reviewed)  Radiology    Dg Chest Port 1 View  Result Date: 09/15/2018 CLINICAL DATA:  Evaluate ET tube EXAM: PORTABLE CHEST 1 VIEW COMPARISON:  09/14/2018 FINDINGS: The ET tube tip is above the carina. Heart size appears normal. No pleural effusion or edema. No airspace densities. Platelike atelectasis noted in the right lower lung. IMPRESSION: 1. Satisfactory position of ET tube with tip above carina. 2. Right lower lung platelike atelectasis. Electronically Signed   By: Signa Kell M.D.   On: 09/15/2018 10:25   Dg Chest Port 1 View  Result Date: 09/14/2018 CLINICAL DATA:  Respiratory failure, syncopal episode, CHF, intubated patient. EXAM: PORTABLE CHEST 1 VIEW COMPARISON:  Portable chest x-ray of September 14, 2018 at 7:42 a.m. FINDINGS: The lungs are mildly hypoinflated. The  interstitial markings are coarse especially at the lung bases. There is no pleural effusion or pneumothorax. The cardiac silhouette is mildly enlarged. The pulmonary vascularity is less engorged. The endotracheal tube tip lies at the level of the inferior margin of the clavicular heads which is approximately 3.4 cm above the carina. IMPRESSION: Bibasilar subsegmental atelectasis or less likely pneumonia. Mild prominence of the central pulmonary vascularity which has improved since earlier today. Stable mild cardiomegaly. The endotracheal tube is in reasonable position.  Low low low Electronically Signed   By: David  [place] M.D.   On: 09/14/2018 10:46     Patient Profile     Billy Simon is a 62 y.o. male admitted with complete heart block  Assessment & Simon    1.  Complete heart block Dependent on temp wire currently Billy Simon need to consider permanent pacing today He remains agitated when he awakens and is currently sedated. Family is concerned he has been off home meds and this is causing. They report that he answered EMS questions appropriately when they arrived day of admission. Temp wire has now been in for 3 days.   He has low grade fever, WBC pending this morning - concerned temp wire could be source of infection. The sooner we can remove the better. Billy Simon tentatively Simon for PPM later today with anesthesia support. Billy Simon discuss placing NG tube at that time to allow for home meds to be given.   Dr Elberta Fortis to see later this morning  For questions or updates, please contact [facility] Please consult [email or web address] for contact info under Cardiology/STEMI.  Signed, Billy Balsam, NP  09/16/2018, 9:10 AM   I have seen and examined this patient with Billy Simon.  Agree with above, note added to reflect my findings.  On exam, RRR, no murmurs, lungs clear. Continued heart block. Simon for pacemaker today. Does have infection and elevated WBC which as been since admission. Due to agitation and  need to remove the temporary wire, Billy Simon pacemaker today.  [name] has presented today for surgery, with the diagnosis of complete heart block.  The various methods of treatment have been discussed with the patient and family. After consideration of risks, benefits and other options for treatment, the patient has consented to  Procedure(s): Pacemaker imlant as a surgical intervention .  Risks include but not limited to bleeding, tamponade, infection, pneumothorax, among others. The patient's history has been reviewed, patient examined, no change in status, stable for surgery.  I have reviewed the patient's chart and labs.  Questions were answered to the patient's satisfaction.  Spirit Wernli M. Jahshua Bonito MD 09/16/2018 10:47 AM

## 2018-09-16 NOTE — Progress Notes (Signed)
PULMONARY / CRITICAL CARE MEDICINE   NAME:  Billy Simon, MRN:  [id number], DOB:  [birth year], LOS: 2 ADMISSION DATE:  09/14/2018, CONSULTATION DATE: 09/14/2018  REFERRING MD: Cardiology, CHIEF COMPLAINT: Complete heart block  BRIEF HISTORY:    62 year old with h/o complete heart block required a cath and temporary femoral pacer insertion on 09/14/2018. Intubated for respiratory failure with hypoxia, hypercarbia, bloody frothy sputum.  Briefly on pressors for hypotension.  SIGNIFICANT PAST MEDICAL HISTORY   Depression and anxiety  SIGNIFICANT EVENTS:  09/14/2018 admitted with complete heart block requiring temporary pacer insertion and intubation. 10/16 Extubated. Started on precedex for agitation  STUDIES:   09/14/2018 2D echo-focal basal hypertrophy, LVEF 60-65%, paradoxical septal motion consistent with right ventricular pacing.  Chest x-ray 09/16/2018-persistent right midlung atelectatic changes.  I reviewed the images personally  CULTURES:  MRSA PCR 09/14/2018-positive  ANTIBIOTICS:    LINES/TUBES:  09/14/2018 endotracheal tube>>10/16 09/14/2018 right femoral pacing>>  CONSULTANTS:  09/14/2018 pulmonary critical care  SUBJECTIVE:  Sedated on Precedex.  Not responsive  CONSTITUTIONAL: BP 97/66   Pulse 80   Temp 100.2 F (37.9 C)   Resp 19   Ht 6' (1.829 m)   Wt 99.2 kg   SpO2 99%   BMI 29.66 kg/m   I/O last 3 completed shifts: In: 1375.8 [I.V.:1225.8; IV Piggyback:150] Out: 1555 [Urine:1555]      PHYSICAL EXAM: Gen:      No acute distress, elderly gentleman HEENT:  EOMI, sclera anicteric Neck:     No masses; no thyromegaly Lungs:    Clear to auscultation bilaterally; normal respiratory effort CV:         Regular rate and rhythm; no murmurs Abd:      + bowel sounds; soft, non-tender; no palpable masses, no distension Ext:    No edema; adequate peripheral perfusion Skin:      Warm and dry; no rash Neuro: sedated, not arousable.  RESOLVED PROBLEM  LIST  Hypotension  ASSESSMENT AND PLAN   Vent dependent respiratory failure in the setting of complete heart block complicated by hypoxia and acidosis. Stable post extubation Wean down oxygen as tolerated  Severe agitation, encephalopathy No EtOH use per family History of anxiety, depression. He has significant psychiatric history.  Unable to give him medications p.o. Start Risperdal sublingual Wean off Precedex  Complete heart block status post right femoral pacing wire placed 09/14/2018 with plan for permanent pacemaker near future Plans for permanent pacemaker per cardiology.  Mild renal insufficiency Post cardiac cath 09/14/2018 Avoid nephrotoxic Holding diuretics Follow urine output and creatinine  SUMMARY OF TODAY'S PLAN:  Wean off Levophed, resume psychiatric medication Pacer today per cardiology.  Best Practice / Goals of Care / Disposition.   DVT PROPHYLAXIS: Hep SQ SUP: Pepcid NUTRITION: N.p.o. MOBILITY: Bedrest GOALS OF CARE: Code full FAMILY DISCUSSIONS: 18 2019 family updated bedside DISPOSITION remains intensive care unit following temporary pacemaker wire  LABS  Glucose No results for input(s): GLUCAP in the last 168 hours.  BMET Recent Labs  Lab 09/14/18 0741  09/14/18 0823 09/14/18 1042 09/15/18 0251 09/16/18 0418  NA 141   < > 145  --  141 143  K 4.1   < > 3.7  --  4.7 4.0  CL 107   < > 106  --  109 108  CO2 17*  --   --   --  23 22  BUN 17   < > 17  --  20 20  CREATININE 1.61*   < > 1.30*  1.13 1.54* 1.47*  GLUCOSE 161*   < > 155*  --  115* 113*   < > = values in this interval not displayed.    Liver Enzymes Recent Labs  Lab 09/14/18 0741  AST 33  ALT 27  ALKPHOS 48  BILITOT 0.9  ALBUMIN 3.6    Electrolytes Recent Labs  Lab 09/14/18 0741 09/14/18 1042 09/15/18 0251 09/16/18 0418  CALCIUM 8.7*  --  8.8* 8.4*  MG  --  1.3* 1.7 1.8  PHOS  --   --  3.8 3.0    CBC Recent Labs  Lab 09/14/18 0741  09/14/18 0823  09/14/18 1042 09/15/18 0905  WBC 11.6*  --   --  14.7* 13.6*  HGB 14.5   < > 14.3 13.1 13.9  HCT 46.0   < > 42.0 41.1 44.7  PLT 263  --   --  246 171   < > = values in this interval not displayed.    ABG Recent Labs  Lab 09/14/18 1151 09/15/18 0325 09/16/18 0557  PHART 7.391 7.339* 7.396  PCO2ART 42.4 47.9 42.0  PO2ART 272.0* 161* 69.0*    Coag's Recent Labs  Lab 09/14/18 0741  INR 1.17    Sepsis Markers No results for input(s): LATICACIDVEN, PROCALCITON, O2SATVEN in the last 168 hours.  Cardiac Enzymes Recent Labs  Lab 09/14/18 1042 09/14/18 1431 09/14/18 2214  TROPONINI 0.07* 0.20* 0.26*   The patient is critically ill with multiple organ system failure and requires high complexity decision making for assessment and support, frequent evaluation and titration of therapies, advanced monitoring, review of radiographic studies and interpretation of complex data.   Critical Care Time devoted to patient care services, exclusive of separately billable procedures, described in this note is 35 minutes.   Chilton Greathouse MD [facility] Pager [phone] If no answer or after 3pm call: [phone] 09/16/2018, 8:59 AM

## 2018-09-16 NOTE — Progress Notes (Signed)
Patient transported to CT and back to room 2H10 without complications.

## 2018-09-17 ENCOUNTER — Inpatient Hospital Stay (HOSPITAL_COMMUNITY): Payer: PRIVATE HEALTH INSURANCE

## 2018-09-17 DIAGNOSIS — R251 Tremor, unspecified: Secondary | ICD-10-CM

## 2018-09-17 LAB — GLUCOSE, CAPILLARY
GLUCOSE-CAPILLARY: 134 mg/dL — AB (ref 70–99)
Glucose-Capillary: 119 mg/dL — ABNORMAL HIGH (ref 70–99)

## 2018-09-17 LAB — PROCALCITONIN: PROCALCITONIN: 0.72 ng/mL

## 2018-09-17 MED ORDER — BUPROPION HCL ER (XL) 150 MG PO TB24
300.0000 mg | ORAL_TABLET | Freq: Every day | ORAL | Status: DC
  Filled 2018-09-17: qty 2

## 2018-09-17 MED ORDER — RISPERIDONE 1 MG PO TBDP
2.0000 mg | ORAL_TABLET | Freq: Three times a day (TID) | ORAL | Status: DC
  Administered 2018-09-18 – 2018-09-20 (×7): 2 mg via SUBLINGUAL
  Filled 2018-09-17 (×7): qty 2

## 2018-09-17 MED ORDER — ORAL CARE MOUTH RINSE
15.0000 mL | OROMUCOSAL | Status: DC
  Administered 2018-09-18 – 2018-10-04 (×155): 15 mL via OROMUCOSAL

## 2018-09-17 MED ORDER — RISPERIDONE 2 MG PO TBDP: 2.0000 mg | ORAL_TABLET | Freq: Three times a day (TID) | ORAL | Status: DC

## 2018-09-17 MED ORDER — BUPROPION HCL 100 MG PO TABS
100.0000 mg | ORAL_TABLET | Freq: Three times a day (TID) | ORAL | Status: DC
  Administered 2018-09-17 – 2018-10-05 (×54): 100 mg via ORAL
  Filled 2018-09-17 (×56): qty 1

## 2018-09-17 MED ORDER — CHLORHEXIDINE GLUCONATE 0.12% ORAL RINSE (MEDLINE KIT)
15.0000 mL | Freq: Two times a day (BID) | OROMUCOSAL | Status: DC
  Administered 2018-09-17 – 2018-10-07 (×39): 15 mL via OROMUCOSAL

## 2018-09-17 MED ORDER — VITAL HIGH PROTEIN PO LIQD: 1000.0000 mL | ORAL | Status: DC

## 2018-09-17 MED ORDER — VITAL AF 1.2 CAL PO LIQD
1000.0000 mL | ORAL | Status: DC
  Administered 2018-09-17 – 2018-09-22 (×4): 1000 mL

## 2018-09-17 MED FILL — Lidocaine HCl Local Inj 1%: INTRAMUSCULAR | Qty: 40 | Status: AC

## 2018-09-17 NOTE — Progress Notes (Signed)
Initial Nutrition Assessment  DOCUMENTATION CODES:   Not applicable  INTERVENTION:    Initiate Vital AF F 1.2 at goal rate of 80 ml/h (1920 ml per day)    Provide 2304 kcals, 144 gm protein, 1557 ml free water daily  NUTRITION DIAGNOSIS:   Inadequate oral intake related to inability to eat as evidenced by NPO status  GOAL:   Patient will meet greater than or equal to 90% of their needs  MONITOR:   TF tolerance, Vent status, Labs, Skin, Weight trends, I & O's  REASON FOR ASSESSMENT:   Consult Enteral/tube feeding initiation and management  ASSESSMENT:   62 yo Male with h/o complete heart block required a cath and temporary femoral pacer insertion on 09/14/2018.    Patient is currently intubated on ventilator support MV: 11.2 L/min Temp (24hrs), Avg:99.7 F (37.6 C), Min:98.8 F (37.1 C), Max:101.4 F (38.6 C)  NGT removed  RD unable to obtain nutrition hx from patient at this time. No nutrition problems identified PTA and/or per nutrition screen.  Cortrak tube placed today >> tip in region of Ligament of Treitz. Verbal with Read Back order received per Dr. Isaiah Serge to start TF.  Pt with severe agitation & encephalopathy.  Labs & medications reviewed. CBG 122. Significant psychiatric history. Plan to start weaning trials.  NUTRITION - FOCUSED PHYSICAL EXAM:    Most Recent Value  Orbital Region  No depletion  Upper Arm Region  No depletion  Thoracic and Lumbar Region  Unable to assess  Buccal Region  No depletion  Temple Region  No depletion  Clavicle Bone Region  No depletion  Clavicle and Acromion Bone Region  No depletion  Scapular Bone Region  Unable to assess  Dorsal Hand  Unable to assess  Patellar Region  No depletion  Anterior Thigh Region  No depletion  Posterior Calf Region  No depletion  Edema (RD Assessment)  None    Diet Order:   Diet Order            Diet NPO time specified  Diet effective now             EDUCATION NEEDS:    Not appropriate for education at this time  Skin:  Skin Assessment: Reviewed RN Assessment  Last BM:  10/14   Intake/Output Summary (Last 24 hours) at 09/17/2018 1418 Last data filed at 09/17/2018 1200 Gross per 24 hour  Intake 804.45 ml  Output 1105 ml  Net -300.55 ml   Height:   Ht Readings from Last 1 Encounters:  09/14/18 6' (1.829 m)   Weight:   Wt Readings from Last 1 Encounters:  09/17/18 99.8 kg   Ideal Body Weight:  81 kg  BMI:  Body mass index is 29.84 kg/m.  Estimated Nutritional Needs:   Kcal:  2339  Protein:  135-150 gm  Fluid:  per MD  Maureen Chatters, RD, LDN Pager #: [phone] After-Hours Pager #: [phone]

## 2018-09-17 NOTE — Significant Event (Signed)
RN adjusted NG tube as it is coiled; not draining secretions and unable to flush air. After interventions, NG tube now is functionally well; draining bile secretions via intermittent wall suction. Spoke with Dr. Isaiah Serge regarding this; no new orders.    Cruise Baumgardner

## 2018-09-17 NOTE — Procedures (Signed)
Cortrak  Person Inserting Tube:  Vanessa Kick, RD Tube Location:  Right nare Initial Placement:  Postpyloric Secured by: Bridle Technique Used to Measure Tube Placement:  Documented cm marking at nare/ corner of mouth Cortrak Secured At:  88 cm Procedure Comments:    X-ray is required, abdominal x-ray has been ordered by the Cortrak team. Please confirm tube placement before using the Cortrak tube.   If the tube becomes dislodged please keep the tube and contact the Cortrak team at [email or web address] ([email or web address]) for replacement.  If after hours and replacement cannot be delayed, place a NG tube and confirm placement with an abdominal x-ray.   Vanessa Kick RD, LDN Clinical Nutrition Pager # [phone]

## 2018-09-17 NOTE — Progress Notes (Signed)
EEG Completed; Results Pending  

## 2018-09-17 NOTE — Progress Notes (Addendum)
Electrophysiology Rounding Note  Patient Name: Billy Simon Date of Encounter: 09/17/2018  Primary Cardiologist: [place] Electrophysiologist: Jaimeson Gopal   Subjective   The patient remains intubated and sedated, +agitation this morning  Inpatient Medications    Scheduled Meds: . benztropine  1 mg Oral Daily  . busPIRone  15 mg Oral BID  . chlorhexidine  60 mL Topical Once  . chlorhexidine  60 mL Topical Once  . Chlorhexidine Gluconate Cloth  6 each Topical Q0600  . citalopram  40 mg Per Tube Daily  . heparin  5,000 Units Subcutaneous Q8H  . mouth rinse  15 mL Mouth Rinse BID  . mupirocin ointment  1 application Nasal BID  . risperiDONE  6 mg Sublingual Daily  . sodium chloride flush  3 mL Intravenous Q12H  . sodium chloride flush  3 mL Intravenous Q12H   Continuous Infusions: . sodium chloride    . sodium chloride Stopped (09/15/18 2155)  . sodium chloride 50 mL/hr at 09/16/18 1705  . dexmedetomidine (PRECEDEX) IV infusion 1.2 mcg/kg/hr (09/17/18 0900)  . famotidine (PEPCID) IV 20 mg (09/17/18 0848)   PRN Meds: sodium chloride, sodium chloride, acetaminophen, fentaNYL (SUBLIMAZE) injection, haloperidol lactate, nitroGLYCERIN, ondansetron (ZOFRAN) IV, sodium chloride flush, sodium chloride flush   Vital Signs    Vitals:   09/17/18 0600 09/17/18 0700 09/17/18 0812 09/17/18 0847  BP: 125/81 (!) 151/80 (!) 153/83   Pulse: 78 73 (!) 7   Resp: (!) 22 18 (!) 2   Temp:    99.2 F (37.3 C)  TempSrc:    Oral  SpO2: 94% 92%    Weight:      Height:        Intake/Output Summary (Last 24 hours) at 09/17/2018 0916 Last data filed at 09/17/2018 0900 Gross per 24 hour  Intake 1150.74 ml  Output 910 ml  Net 240.74 ml   Filed Weights   09/15/18 0421 09/16/18 0408 09/17/18 0500  Weight: 99.4 kg 99.2 kg 99.8 kg    Physical Exam    GEN- The patient is intubated and sedated  Head- normocephalic, atraumatic Eyes-  Sclera clear, conjunctiva pink Oropharynx- +ETT Neck-  supple Lungs- +vent Heart- Regular rate and rhythm (paced) GI- soft, NT, ND, + BS Extremities- no clubbing, cyanosis, or edema Skin- no rash or lesion, left chest without hematoma Psych- euthymic mood, full affect Neuro- strength and sensation are intact  Labs    CBC Recent Labs    09/15/18 0905 09/16/18 0806  WBC 13.6* 16.2*  NEUTROABS 10.1*  --   HGB 13.9 13.4  HCT 44.7 41.8  MCV 90.5 89.7  PLT 171 158   Basic Metabolic Panel Recent Labs    16/10/96 0251 09/16/18 0418  NA 141 143  K 4.7 4.0  CL 109 108  CO2 23 22  GLUCOSE 115* 113*  BUN 20 20  CREATININE 1.54* 1.47*  CALCIUM 8.8* 8.4*  MG 1.7 1.8  PHOS 3.8 3.0   Cardiac Enzymes Recent Labs    09/14/18 1042 09/14/18 1431 09/14/18 2214  TROPONINI 0.07* 0.20* 0.26*     Telemetry    SR with V pacing (personally reviewed)  Radiology    Dg Chest 1 View  Result Date: 09/16/2018 CLINICAL DATA:  Intubated patient. EXAM: CHEST  1 VIEW COMPARISON:  09/16/2018 FINDINGS: Post intubation. The endotracheal tube is somewhat obliquely oriented within the tracheal air column and terminates 5.2 cm above the carina. There has been an interval placement of dual lead cardiac pacemaker with  expected positioning of the leads. Enteric catheter terminates in the proximal stomach. Cardiomediastinal silhouette is normal. Mediastinal contours appear intact. There is no evidence of pneumothorax. Volume loss in the right hemithorax with atelectatic changes in the lung base. Osseous structures are without acute abnormality. Soft tissues are grossly normal. IMPRESSION: Post intubation. Endotracheal tube is somewhat obliquely oriented within the tracheal air column and terminates 5.2 cm above the carina. Advancement with approximately 1 cm may be considered. Interval placement of dual lead cardiac pacemaker in satisfactory radiographic position. Right lung volume loss with atelectatic changes in the lung base. Electronically Signed   By:  Ted Mcalpine M.D.   On: 09/16/2018 17:35   Ct Head Wo Contrast  Result Date: 09/16/2018 CLINICAL DATA:  LOC.  Agitated and confused. EXAM: CT HEAD WITHOUT CONTRAST TECHNIQUE: Contiguous axial images were obtained from the base of the skull through the vertex without intravenous contrast. COMPARISON:  None. FINDINGS: Brain: Mild ventriculomegaly, without corresponding cortical atrophy or periventricular white matter abnormality. Significance uncertain. Early central atrophy could have this appearance as could early normal pressure hydrocephalus. No acute stroke, hemorrhage, mass lesion, or extra-axial fluid. Vascular: Calcification of the cavernous internal carotid arteries consistent with cerebrovascular atherosclerotic disease. No signs of intracranial large vessel occlusion. Skull: Calvarium intact. Sinuses/Orbits: Pansinusitis. Layering fluid in the RIGHT frontal sinus represents acuity. Orbits only partially visualized. Other: No middle ear or mastoid fluid. IMPRESSION: Mild ventriculomegaly, uncertain duration and significance. No acute intracranial findings. Specifically no visible acute stroke or hemorrhage. RIGHT frontal sinusitis, acute. Electronically Signed   By: Elsie Stain M.D.   On: 09/16/2018 16:20   Dg Chest Port 1 View  Result Date: 09/17/2018 CLINICAL DATA:  Check endotracheal tube placement EXAM: PORTABLE CHEST 1 VIEW COMPARISON:  09/16/2018 FINDINGS: Cardiac shadow is stable. Pacing device is again noted and stable. Endotracheal tube and nasogastric catheter are unchanged. Better visualized on today's examination is a loop of nasogastric catheter within the cervical esophagus. This could be withdrawn for uncoiling. The lungs are well aerated bilaterally. Patchy atelectasis in the right base are again seen and stable. IMPRESSION: Stable atelectasis in the right base. Nasogastric catheter is coiled upon itself within the cervical esophagus. This could be withdrawn 4 uncoiling.  These results will be called to the ordering clinician or representative by the Radiologist Assistant, and communication documented in the PACS or zVision Dashboard. Electronically Signed   By: Alcide Clever M.D.   On: 09/17/2018 08:12   Dg Chest Port 1 View  Result Date: 09/16/2018 CLINICAL DATA:  Respiratory failure EXAM: PORTABLE CHEST 1 VIEW COMPARISON:  09/15/2018 FINDINGS: Cardiac shadow is mildly prominent but accentuated by the portable technique. Poor inspiratory effort is noted with vascular crowding. Some atelectatic changes are noted in the right mid lung similar to that seen on the prior exam somewhat accentuated by the poor inspiratory effort. No sizable effusion is seen. No bony abnormality is noted. IMPRESSION: Persistent right midlung atelectatic changes accentuated by the poor inspiratory effort. Electronically Signed   By: Alcide Clever M.D.   On: 09/16/2018 09:24     Assessment & Plan    1.  Complete heart block Pacemaker placed yesterday CXR yesterday with leads in stable position (only able to visualize RA lead on this morning's CXR) Device interrogation reviewed and normal Routine wound care and follow up (arranged and entered in AVS)  2.  Respiratory failure/agitation/AMS Per CCM   For questions or updates, please contact [facility] Please consult [email or web address] for  contact info under Cardiology/STEMI.  Signed, Gypsy Balsam, NP  09/17/2018, 9:16 AM    I have seen, examined the patient, and reviewed the above assessment and plan.  Changes to above are made where necessary.  On exam, sedated on vent, RRR.  Device interrogation is reviewed and normal.  CXR reveals stable lead, no ptx.    No further inpatient EP management required Electrophysiology team to see as needed while here. Please call with questions.   Co Sign: Hillis Range, MD 09/17/2018

## 2018-09-17 NOTE — Procedures (Signed)
ELECTROENCEPHALOGRAM REPORT   Patient: Billy Simon       Room #: [facility] EEG No. ID: [id number] Age: 62 y.o.        Sex: male Referring Physician: [place] Report Date:  09/17/2018        Interpreting Physician: Thana Farr  History: [name] is an 62 y.o. male s/p respiratory failure with episodes of shaking  Medications:  Cogentin, Wellbutrin, Buspar, Celexa, Pepcid, Risperdal, Precedex  Conditions of Recording:  This is a 21 channel routine scalp EEG performed with bipolar and monopolar montages arranged in accordance to the international 10/20 system of electrode placement. One channel was dedicated to EKG recording.  The patient is in the intubated and sedated state.  Description:  Artifact is prominent during the recording often obscuring the background rhythm. When able to be visualized the background is slow and poorly organized.   It consists of a low voltage, polymorphic delta rhythm that is diffusely distributed and continuous.  There are some superimposed poorly formed sleep transients noted as well.  The patient has a prolonged episode of shaking during the recording.  Arms and legs are moving asynchronously and head is shaking back and forth.  Some artifact is noted but patient maintains the aforementioned slow background rhythm.  No epileptiform activity is noted and there is no evidence of electrographic seizures.  Hyperventilation and intermittent photic stimulation were not performed.   IMPRESSION: This is an abnormal EEG secondary to general background slowing.  This finding may be seen with a diffuse disturbance that is etiologically nonspecific, but may include a metabolic encephalopathy, among other possibilities.  No epileptiform activity was noted.  Although patient with a prolonged episode of shaking there was no evidence of electrographic seizures.   Thana Farr, MD Neurology [phone] 09/17/2018, 5:31 PM

## 2018-09-17 NOTE — Discharge Instructions (Signed)
° ° °  Supplemental Discharge Instructions for  °Pacemaker/Defibrillator Patients ° °Activity °No heavy lifting or vigorous activity with your left/right arm for 6 to 8 weeks.  Do not raise your left/right arm above your head for one week.  Gradually raise your affected arm as drawn below. ° °        ° °__      09/21/18                  09/22/18                 09/23/18               09/24/18 ° ° °WOUND CARE °- Keep the wound area clean and dry.  Do not get this area wet for one week. No showers for one week; you may shower on  09/24/18  . °- The tape/steri-strips on your wound will fall off; do not pull them off.  No bandage is needed on the site.  DO  NOT apply any creams, oils, or ointments to the wound area. °- If you notice any drainage or discharge from the wound, any swelling or bruising at the site, or you develop a fever > 101? F after you are discharged home, call the office at once. ° °Special Instructions °- You are still able to use cellular telephones; use the ear opposite the side where you have your pacemaker/defibrillator.  Avoid carrying your cellular phone near your device. °- When traveling through airports, show security personnel your identification card to avoid being screened in the metal detectors.  Ask the security personnel to use the hand wand. °- Avoid arc welding equipment, MRI testing (magnetic resonance imaging), TENS units (transcutaneous nerve stimulators).  Call the office for questions about other devices. °- Avoid electrical appliances that are in poor condition or are not properly grounded. °- Microwave ovens are safe to be near or to operate. °  °

## 2018-09-17 NOTE — Progress Notes (Signed)
PULMONARY / CRITICAL CARE MEDICINE   NAME:  Billy Simon, MRN:  [id number], DOB:  1956/05/09, LOS: 3 ADMISSION DATE:  09/14/2018, CONSULTATION DATE: 09/14/2018  REFERRING MD: Cardiology, CHIEF COMPLAINT: Complete heart block  BRIEF HISTORY:    62 year old with h/o complete heart block required a cath and temporary femoral pacer insertion on 09/14/2018. Intubated for respiratory failure with hypoxia, hypercarbia, bloody frothy sputum.  Briefly on pressors for hypotension.  SIGNIFICANT PAST MEDICAL HISTORY   Depression and anxiety  SIGNIFICANT EVENTS:  09/14/2018 admitted with complete heart block requiring temporary pacer insertion and intubation. 10/16 Extubated. Started on precedex for agitation 10/17 Reintubated for permanent pacemaker insertion.  Kept on vent due to agitation.  STUDIES:    2D echo- 09/14/2018 Focal basal hypertrophy, LVEF 60-65%, paradoxical septal motion consistent with right ventricular pacing. CT head 09/16/2018- mild ventricular megaly, no acute intracranial findings, right frontal sinusitis.  Chest x-ray 09/16/2018-persistent right midlung atelectatic changes.  Chest x-ray 09/17/2018-ET tube in good position, NG tube is coiled in the throat, improvement of right lung atelectatic changes I reviewed the images personally.  CULTURES:  MRSA PCR 09/14/2018-positive Blood culture 09/16/2018- 9  ANTIBIOTICS:  Cefazolin for surgical prophylaxis  LINES/TUBES:  09/14/2018 endotracheal tube>>10/16, retubed 10/17 >> 09/14/2018 right femoral pacing>> 10/17  CONSULTANTS:  09/14/2018 pulmonary critical care  SUBJECTIVE:  Increasing agitation early morning.  Sedation increased Remains on the vent, unresponsive at time of my examination.  CONSTITUTIONAL: BP (!) 153/83   Pulse (!) 7   Temp 99.2 F (37.3 C) (Oral)   Resp (!) 2   Ht 6' (1.829 m)   Wt 99.8 kg   SpO2 92%   BMI 29.84 kg/m   I/O last 3 completed shifts: In: 1141.7 [I.V.:991.7; IV  Piggyback:150] Out: 1470 [Urine:1470]   Vent Mode: CPAP;PSV FiO2 (%):  [40 %-80 %] 40 % Set Rate:  [18 bmp] 18 bmp Vt Set:  [409 mL] 620 mL PEEP:  [5 cmH20] 5 cmH20 Pressure Support:  [10 cmH20] 10 cmH20 Plateau Pressure:  [18 cmH20-20 cmH20] 19 cmH20  PHYSICAL EXAM: Gen:      No acute distress HEENT:  EOMI, sclera anicteric, ETT Neck:     No masses; no thyromegaly Lungs:    Clear to auscultation bilaterally; normal respiratory effort CV:         Regular rate and rhythm; no murmurs Abd:      + bowel sounds; soft, non-tender; no palpable masses, no distension Ext:    No edema; adequate peripheral perfusion Skin:      Warm and dry; no rash Neuro: Sedated, unresponsive  RESOLVED PROBLEM LIST  Hypotension  ASSESSMENT AND PLAN   Vent dependent respiratory failure in the setting of complete heart block complicated by hypoxia and acidosis. Continues on the vent.  Agitation remains a barrier to extubation Start weaning trials  Severe agitation, encephalopathy No EtOH use per family History of anxiety, depression. He has significant psychiatric history.   Continue psychiatric medication via tube Continue Precedex, haldol PRN  Complete heart block status post right femoral pacing wire placed 09/14/2018 Permanent pacemaker 10/18 Tele monitoring  Mild renal insufficiency > improving Post cardiac cath 09/14/2018 Avoid nephrotoxic Holding diuretics Follow urine output and creatinine  GI Remove NG tube as it is coiled Place cortrak  SUMMARY OF TODAY'S PLAN:  Start weaning trials Management of agitation.  Best Practice / Goals of Care / Disposition.   DVT PROPHYLAXIS: Hep SQ SUP: Pepcid NUTRITION: N.p.o. MOBILITY: Bedrest GOALS OF CARE: Code full  FAMILY DISCUSSIONS: 18 2019 family updated bedside DISPOSITION Keep in ICU.  LABS  Glucose Recent Labs  Lab 09/16/18 1910  GLUCAP 122*    BMET Recent Labs  Lab 09/14/18 0741  09/14/18 0823 09/14/18 1042  09/15/18 0251 09/16/18 0418  NA 141   < > 145  --  141 143  K 4.1   < > 3.7  --  4.7 4.0  CL 107   < > 106  --  109 108  CO2 17*  --   --   --  23 22  BUN 17   < > 17  --  20 20  CREATININE 1.61*   < > 1.30* 1.13 1.54* 1.47*  GLUCOSE 161*   < > 155*  --  115* 113*   < > = values in this interval not displayed.    Liver Enzymes Recent Labs  Lab 09/14/18 0741  AST 33  ALT 27  ALKPHOS 48  BILITOT 0.9  ALBUMIN 3.6    Electrolytes Recent Labs  Lab 09/14/18 0741 09/14/18 1042 09/15/18 0251 09/16/18 0418  CALCIUM 8.7*  --  8.8* 8.4*  MG  --  1.3* 1.7 1.8  PHOS  --   --  3.8 3.0    CBC Recent Labs  Lab 09/14/18 1042 09/15/18 0905 09/16/18 0806  WBC 14.7* 13.6* 16.2*  HGB 13.1 13.9 13.4  HCT 41.1 44.7 41.8  PLT 246 171 158    ABG Recent Labs  Lab 09/15/18 0325 09/16/18 0557 09/16/18 1506  PHART 7.339* 7.396 7.420  PCO2ART 47.9 42.0 36.5  PO2ART 161* 69.0* 131.0*    Coag's Recent Labs  Lab 09/14/18 0741  INR 1.17    Sepsis Markers Recent Labs  Lab 09/16/18 1706 09/17/18 0255  PROCALCITON 0.25 0.72    Cardiac Enzymes Recent Labs  Lab 09/14/18 1042 09/14/18 1431 09/14/18 2214  TROPONINI 0.07* 0.20* 0.26*   The patient is critically ill with multiple organ system failure and requires high complexity decision making for assessment and support, frequent evaluation and titration of therapies, advanced monitoring, review of radiographic studies and interpretation of complex data.   Critical Care Time devoted to patient care services, exclusive of separately billable procedures, described in this note is 35 minutes.   Chilton Greathouse MD [facility] Pager [phone] If no answer or after 3pm call: [phone] 09/17/2018, 9:31 AM

## 2018-09-17 NOTE — Progress Notes (Signed)
E-link notified of patients increasing agitation. Patient began kicking and punching, pulling at lines and tubes. Multiple nurses attempted to calm and reorient him. Pacemaker site is clean, dry and intact, no hematoma. Orders received to increase sedation and initiate soft wrist restraints, will continue to assess and monitor closely.

## 2018-09-17 NOTE — Plan of Care (Signed)
Patient remain agiated  when attempting to wean from Precidex and Fentanyl given PRN for extreme agiation. Will continue to attempt to wean as patient tolerates.  Cortrek feeding tube placed and tube feeds started, patient tolerating well.  Will continue to check blood sugars every 4 hours.  EEG completed.  Will continue to monitor closely

## 2018-09-18 ENCOUNTER — Inpatient Hospital Stay (HOSPITAL_COMMUNITY): Payer: PRIVATE HEALTH INSURANCE

## 2018-09-18 ENCOUNTER — Inpatient Hospital Stay: Payer: Self-pay

## 2018-09-18 LAB — CBC WITH DIFFERENTIAL/PLATELET
ABS IMMATURE GRANULOCYTES: 0.05 10*3/uL (ref 0.00–0.07)
Basophils Absolute: 0 10*3/uL (ref 0.0–0.1)
Basophils Relative: 0 %
Eosinophils Absolute: 0 10*3/uL (ref 0.0–0.5)
Eosinophils Relative: 0 %
HEMATOCRIT: 36.8 % — AB (ref 39.0–52.0)
HEMOGLOBIN: 11.8 g/dL — AB (ref 13.0–17.0)
Immature Granulocytes: 0 %
LYMPHS PCT: 4 %
Lymphs Abs: 0.5 10*3/uL — ABNORMAL LOW (ref 0.7–4.0)
MCH: 28.2 pg (ref 26.0–34.0)
MCHC: 32.1 g/dL (ref 30.0–36.0)
MCV: 88 fL (ref 80.0–100.0)
MONO ABS: 1.1 10*3/uL — AB (ref 0.1–1.0)
MONOS PCT: 10 %
NEUTROS ABS: 9.8 10*3/uL — AB (ref 1.7–7.7)
Neutrophils Relative %: 86 %
Platelets: 109 10*3/uL — ABNORMAL LOW (ref 150–400)
RBC: 4.18 MIL/uL — AB (ref 4.22–5.81)
RDW: 14.3 % (ref 11.5–15.5)
WBC: 11.5 10*3/uL — ABNORMAL HIGH (ref 4.0–10.5)
nRBC: 0 % (ref 0.0–0.2)

## 2018-09-18 LAB — PROCALCITONIN: Procalcitonin: 0.82 ng/mL

## 2018-09-18 LAB — COMPREHENSIVE METABOLIC PANEL
ALT: 46 U/L — AB (ref 0–44)
AST: 96 U/L — AB (ref 15–41)
Albumin: 2.5 g/dL — ABNORMAL LOW (ref 3.5–5.0)
Alkaline Phosphatase: 35 U/L — ABNORMAL LOW (ref 38–126)
Anion gap: 8 (ref 5–15)
BUN: 19 mg/dL (ref 8–23)
CHLORIDE: 112 mmol/L — AB (ref 98–111)
CO2: 23 mmol/L (ref 22–32)
Calcium: 7.4 mg/dL — ABNORMAL LOW (ref 8.9–10.3)
Creatinine, Ser: 1.04 mg/dL (ref 0.61–1.24)
GFR calc Af Amer: >60 mL/min (ref >60)
GFR calc non Af Amer: >60 mL/min (ref >60)
GLUCOSE: 120 mg/dL — AB (ref 70–99)
POTASSIUM: 3.2 mmol/L — AB (ref 3.5–5.1)
Sodium: 143 mmol/L (ref 135–145)
Total Bilirubin: 0.8 mg/dL (ref 0.3–1.2)
Total Protein: 5.3 g/dL — ABNORMAL LOW (ref 6.5–8.1)

## 2018-09-18 LAB — URINALYSIS, ROUTINE W REFLEX MICROSCOPIC
BILIRUBIN URINE: NEGATIVE
GLUCOSE, UA: NEGATIVE mg/dL
Ketones, ur: 5 mg/dL — AB
LEUKOCYTES UA: NEGATIVE
NITRITE: NEGATIVE
Protein, ur: 100 mg/dL — AB
Specific Gravity, Urine: 1.023 (ref 1.005–1.030)
pH: 5 (ref 5.0–8.0)

## 2018-09-18 LAB — GLUCOSE, CAPILLARY
GLUCOSE-CAPILLARY: 111 mg/dL — AB (ref 70–99)
GLUCOSE-CAPILLARY: 111 mg/dL — AB (ref 70–99)
GLUCOSE-CAPILLARY: 113 mg/dL — AB (ref 70–99)
GLUCOSE-CAPILLARY: 137 mg/dL — AB (ref 70–99)
Glucose-Capillary: 100 mg/dL — ABNORMAL HIGH (ref 70–99)
Glucose-Capillary: 113 mg/dL — ABNORMAL HIGH (ref 70–99)
Glucose-Capillary: 113 mg/dL — ABNORMAL HIGH (ref 70–99)

## 2018-09-18 LAB — TROPONIN I: Troponin I: 0.07 ng/mL (ref <0.03)

## 2018-09-18 LAB — LACTIC ACID, PLASMA: Lactic Acid, Venous: 1.7 mmol/L (ref 0.5–1.9)

## 2018-09-18 MED ORDER — ETOMIDATE 2 MG/ML IV SOLN
INTRAVENOUS | Status: AC
  Administered 2018-09-18: 20 mg via INTRAVENOUS
  Filled 2018-09-18: qty 10

## 2018-09-18 MED ORDER — PIPERACILLIN-TAZOBACTAM 3.375 G IVPB
3.3750 g | Freq: Three times a day (TID) | INTRAVENOUS | Status: DC
  Administered 2018-09-18 – 2018-09-26 (×24): 3.375 g via INTRAVENOUS
  Filled 2018-09-18 (×26): qty 50

## 2018-09-18 MED ORDER — ETOMIDATE 2 MG/ML IV SOLN
20.0000 mg | Freq: Once | INTRAVENOUS | Status: AC
  Administered 2018-09-18: 20 mg via INTRAVENOUS

## 2018-09-18 MED ORDER — BENZTROPINE MESYLATE 1 MG/ML IJ SOLN
1.0000 mg | Freq: Every day | INTRAMUSCULAR | Status: DC
  Administered 2018-09-18 – 2018-09-22 (×5): 1 mg via INTRAVENOUS
  Filled 2018-09-18 (×6): qty 1

## 2018-09-18 MED ORDER — SODIUM CHLORIDE 0.9% FLUSH
10.0000 mL | Freq: Two times a day (BID) | INTRAVENOUS | Status: DC
  Administered 2018-09-19: 30 mL
  Administered 2018-09-20 – 2018-09-22 (×4): 10 mL
  Administered 2018-09-23: 30 mL
  Administered 2018-09-23 – 2018-09-27 (×5): 10 mL
  Administered 2018-09-27: 40 mL
  Administered 2018-09-28 (×2): 10 mL

## 2018-09-18 MED ORDER — MIDAZOLAM HCL 2 MG/2ML IJ SOLN
2.0000 mg | Freq: Four times a day (QID) | INTRAMUSCULAR | Status: DC | PRN
  Administered 2018-09-18 – 2018-09-19 (×3): 2 mg via INTRAVENOUS
  Filled 2018-09-18 (×6): qty 2

## 2018-09-18 MED ORDER — SODIUM CHLORIDE 0.9% FLUSH
10.0000 mL | INTRAVENOUS | Status: DC | PRN
  Administered 2018-09-24: 10 mL
  Administered 2018-09-24: 40 mL
  Filled 2018-09-18 (×2): qty 40

## 2018-09-18 MED ORDER — CHLORHEXIDINE GLUCONATE CLOTH 2 % EX PADS
6.0000 | MEDICATED_PAD | Freq: Every day | CUTANEOUS | Status: DC
  Administered 2018-09-19 – 2018-09-29 (×10): 6 via TOPICAL

## 2018-09-18 MED ORDER — MIDAZOLAM HCL 2 MG/2ML IJ SOLN
2.0000 mg | Freq: Once | INTRAMUSCULAR | Status: AC
  Administered 2018-09-18: 2 mg via INTRAVENOUS
  Filled 2018-09-18: qty 2

## 2018-09-18 MED ORDER — VANCOMYCIN HCL 10 G IV SOLR
1250.0000 mg | Freq: Two times a day (BID) | INTRAVENOUS | Status: DC
  Administered 2018-09-18 – 2018-09-20 (×5): 1250 mg via INTRAVENOUS
  Filled 2018-09-18 (×6): qty 1250

## 2018-09-18 MED ORDER — POTASSIUM CHLORIDE 20 MEQ/15ML (10%) PO SOLN
40.0000 meq | Freq: Once | ORAL | Status: AC
  Administered 2018-09-18: 40 meq via ORAL
  Filled 2018-09-18: qty 30

## 2018-09-18 NOTE — Progress Notes (Signed)
  Tyron Russell, RN  Registered Nurse    Progress Notes  Signed  Date of Service:  09/18/2018 10:15 AM          Signed         Show:Clear all [x] Manual[] Template[] Copied  Added by: [x] Tyron Russell, RN  [] Hover for details Spoke with French Ana, RN, concerning PICC placement. Orders for blood cultures this am. Patient now has one PIV and sedation is an issue. Blood cultures haven't been drawn due to agitation. Rozann Lesches, RN, that PICC placement isn't appropriate at this time due to blood cultures to be drawn, fever, and the agitation of the patient. Instructed her to contact primary to request a central line to be placed. VAST team offered to assess to see if another PIV can be placed, but French Ana, RN said patient too agitated to maintain PIV access.

## 2018-09-18 NOTE — Progress Notes (Signed)
Pharmacy Antibiotic Note  Billy Simon is a 62 y.o. male admitted on 09/14/2018 with CHB taken to cath lab no CAD norm LV, temporary pacer placed> perm pacer placed 10/17 patient remains intubated and sedated but aggitated, inc temp 101, inc wbc 16, tachypenic, Bcx 10/18 ngtd.  Pharmacy has been consulted for Vancomycin and zosyn dosing.  Plan: Zosyn 3.375gm IV q8h EI Vancomycin 1250mg  IV q12h  Height: 6' (182.9 cm) Weight: 220 lb 10.9 oz (100.1 kg) IBW/kg (Calculated) : 77.6  Temp (24hrs), Avg:99.8 F (37.7 C), Min:98.4 F (36.9 C), Max:101.8 F (38.8 C)  Recent Labs  Lab 09/14/18 0741 09/14/18 0750 09/14/18 0823 09/14/18 1042 09/15/18 0251 09/15/18 0905 09/16/18 0418 09/16/18 0806  WBC 11.6*  --   --  14.7*  --  13.6*  --  16.2*  CREATININE 1.61* 1.50*  1.50* 1.30* 1.13 1.54*  --  1.47*  --     Estimated Creatinine Clearance: 64.6 mL/min (A) (by C-G formula based on SCr of 1.47 mg/dL (H)).    No Known Allergies  Antimicrobials this admission: vanc 10/19 > zisyb 10/19 >  Dose adjustments this admission:   Microbiology results: 10/18 BCx: ngtd 10/15 MRSA PCR: +  Leota Sauers Pharm.D. CPP, BCPS Clinical Pharmacist [phone] 09/18/2018 8:57 AM

## 2018-09-18 NOTE — Progress Notes (Signed)
PULMONARY / CRITICAL CARE MEDICINE   NAME:  Billy Simon, MRN:  [id number], DOB:  [birth year], LOS: 4 ADMISSION DATE:  09/14/2018, CONSULTATION DATE: 09/14/2018  REFERRING MD: Cardiology, CHIEF COMPLAINT: Complete heart block  BRIEF HISTORY:    62 year old with h/o complete heart block required a cath and temporary femoral pacer insertion on 09/14/2018. Intubated for respiratory failure with hypoxia, hypercarbia, bloody frothy sputum.  Briefly on pressors for hypotension.  SIGNIFICANT PAST MEDICAL HISTORY   Depression and anxiety  SIGNIFICANT EVENTS:  09/14/2018 admitted with complete heart block requiring temporary pacer insertion and intubation. 10/16 Extubated. Started on precedex for agitation 10/17 Reintubated for permanent pacemaker insertion.  Kept on vent due to agitation. 10/19 Ongoing agitation, head CT and EEG are unremarkable. Started on broad abx for persistent fevers, seceretions  STUDIES:    2D echo- 09/14/2018 Focal basal hypertrophy, LVEF 60-65%, paradoxical septal motion consistent with right ventricular pacing. CT head 09/16/2018- mild ventricular megaly, no acute intracranial findings, right frontal sinusitis. EEG 10/90/19-mild diffuse slowing, no seizures.  Chest x-ray 09/16/2018-persistent right midlung atelectatic changes.  Chest x-ray 09/17/2018-ET tube in good position, NG tube is coiled in the throat, improvement of right lung atelectatic changes I reviewed the images personally.  CULTURES:  MRSA PCR 09/14/2018-positive Blood culture 09/16/2018- 9  ANTIBIOTICS:  Cefazolin 10/17- 10/18 Vanco 10/19 > Zosyn 10/19 >  LINES/TUBES:  09/14/2018 endotracheal tube>>10/16, retubed 10/17 >> 09/14/2018 right femoral pacing>> 10/17  CONSULTANTS:  09/14/2018 pulmonary critical care  SUBJECTIVE:  Continues to have agitation.  Precedex increased to 1.8 Getting intermittent fentanyl, Haldol Noted to have increasing secretions, continues to be  febrile  CONSTITUTIONAL: BP 133/74   Pulse 68   Temp 99.2 F (37.3 C) (Axillary)   Resp 20   Ht 6' (1.829 m)   Wt 100.1 kg   SpO2 93%   BMI 29.93 kg/m   I/O last 3 completed shifts: In: 3043.3 [I.V.:1720; NG/GT:1223.3; IV Piggyback:100] Out: 1820 [Urine:1650; Emesis/NG output:170]   Vent Mode: PRVC FiO2 (%):  [40 %] 40 % Set Rate:  [18 bmp] 18 bmp Vt Set:  [620 mL] 620 mL PEEP:  [5 cmH20] 5 cmH20 Plateau Pressure:  [18 cmH20-22 cmH20] 20 cmH20  PHYSICAL EXAM: Gen:      No acute distress HEENT:  EOMI, sclera anicteric, ET tube Neck:     No masses; no thyromegaly Lungs:    Clear to auscultation bilaterally; normal respiratory effort CV:         Regular rate and rhythm; no murmurs Abd:      + bowel sounds; soft, non-tender; no palpable masses, no distension Ext:    No edema; adequate peripheral perfusion Skin:      Warm and dry; no rash Neuro: Sedated, intermittently agitated.  RESOLVED PROBLEM LIST  Hypotension  ASSESSMENT AND PLAN   Vent dependent respiratory failure in the setting of complete heart block complicated by hypoxia and acidosis. Continues on the vent.  Agitation remains a barrier to extubation Continue weaning trials as tolerated. Increased secretions today concerning for HCAP. Pct is increasing Repeat chest x-ray, sputum cultures, blood cultures Start Vanco, Zosyn.    Severe agitation, encephalopathy No EtOH use per family History of anxiety, depression. He has significant psychiatric history.   Continue psychiatric medication via tube Continue Precedex, haldol PRN  Complete heart block status post right femoral pacing wire placed 09/14/2018 Permanent pacemaker 10/18 Tele monitoring  Mild renal insufficiency > improving Post cardiac cath 09/14/2018 Follow urine output and creatinine  GI Tube  feeds via Cortrak  SUMMARY OF TODAY'S PLAN:  Continue weaning trials, start antibiotics for possible H CAP Management of agitation.  Best Practice  / Goals of Care / Disposition.   DVT PROPHYLAXIS: Hep SQ SUP: Pepcid NUTRITION: Tube feeds MOBILITY: Bedrest GOALS OF CARE: Code full FAMILY DISCUSSIONS: Wife updated daily. DISPOSITION ICU.  LABS  Glucose Recent Labs  Lab 09/16/18 1910 09/17/18 1722 09/17/18 2113 09/17/18 2344 09/18/18 0357 09/18/18 0737  GLUCAP 122* 119* 134* 111* 137* 100*    BMET Recent Labs  Lab 09/14/18 0741  09/14/18 0823 09/14/18 1042 09/15/18 0251 09/16/18 0418  NA 141   < > 145  --  141 143  K 4.1   < > 3.7  --  4.7 4.0  CL 107   < > 106  --  109 108  CO2 17*  --   --   --  23 22  BUN 17   < > 17  --  20 20  CREATININE 1.61*   < > 1.30* 1.13 1.54* 1.47*  GLUCOSE 161*   < > 155*  --  115* 113*   < > = values in this interval not displayed.    Liver Enzymes Recent Labs  Lab 09/14/18 0741  AST 33  ALT 27  ALKPHOS 48  BILITOT 0.9  ALBUMIN 3.6    Electrolytes Recent Labs  Lab 09/14/18 0741 09/14/18 1042 09/15/18 0251 09/16/18 0418  CALCIUM 8.7*  --  8.8* 8.4*  MG  --  1.3* 1.7 1.8  PHOS  --   --  3.8 3.0    CBC Recent Labs  Lab 09/14/18 1042 09/15/18 0905 09/16/18 0806  WBC 14.7* 13.6* 16.2*  HGB 13.1 13.9 13.4  HCT 41.1 44.7 41.8  PLT 246 171 158    ABG Recent Labs  Lab 09/15/18 0325 09/16/18 0557 09/16/18 1506  PHART 7.339* 7.396 7.420  PCO2ART 47.9 42.0 36.5  PO2ART 161* 69.0* 131.0*    Coag's Recent Labs  Lab 09/14/18 0741  INR 1.17    Sepsis Markers Recent Labs  Lab 09/16/18 1706 09/17/18 0255 09/18/18 0243  PROCALCITON 0.25 0.72 0.82    Cardiac Enzymes Recent Labs  Lab 09/14/18 1042 09/14/18 1431 09/14/18 2214  TROPONINI 0.07* 0.20* 0.26*   The patient is critically ill with multiple organ system failure and requires high complexity decision making for assessment and support, frequent evaluation and titration of therapies, advanced monitoring, review of radiographic studies and interpretation of complex data.   Critical Care  Time devoted to patient care services, exclusive of separately billable procedures, described in this note is 35 minutes.   Chilton Greathouse MD  Pulmonary and Critical Care Pager [phone] If no answer or after 3pm call: [phone] 09/18/2018, 8:52 AM

## 2018-09-18 NOTE — Progress Notes (Signed)
Dr. Isaiah Serge in to see patient who is agitated, tachypneic and thrashing about the bed. Per Dr. Isaiah Serge, Precedex increased to 1.8 and Fent given.

## 2018-09-18 NOTE — Procedures (Signed)
Central Venous Catheter Insertion Procedure Note Keahi Mccarney [id number] [birth year]  Procedure: Insertion of Central Venous Catheter Indications: Drug and/or fluid administration  Procedure Details Consent: Risks of procedure as well as the alternatives and risks of each were explained to the (patient/caregiver).  Consent for procedure obtained. Time Out: Verified patient identification, verified procedure, site/side was marked, verified correct patient position, special equipment/implants available, medications/allergies/relevent history reviewed, required imaging and test results available.  Performed  Maximum sterile technique was used including antiseptics, cap, gloves, gown, hand hygiene, mask and sheet. Skin prep: Chlorhexidine; local anesthetic administered A antimicrobial bonded/coated triple lumen catheter was placed in the right internal jugular vein using the Seldinger technique.  Evaluation Blood flow good Complications: No apparent complications Patient did tolerate procedure well. Chest X-ray ordered to verify placement.  CXR: pending.  Annarose Ouellet 09/18/2018, 12:21 PM

## 2018-09-18 NOTE — Progress Notes (Signed)
Pt in bilateral soft wrist restraints at beginning of shift. Dr Isaiah Serge aware and verbal orders given to keep in place. Order not placed until now, but unable to backdate to appropriate time.

## 2018-09-18 NOTE — Progress Notes (Signed)
SLP Cancellation Note  Patient Details Name: Kuzey Ogata MRN: [id number] DOB: 07-25-1956   Cancelled treatment:       Reason Eval/Treat Not Completed: Medical issues which prohibited therapy; pt remains intubated this date. Will attempt as able for BSE when pt medically ready.   Tressie Stalker, M.S., CCC-SLP 09/18/2018, 8:29 AM

## 2018-09-19 ENCOUNTER — Inpatient Hospital Stay (HOSPITAL_COMMUNITY): Payer: PRIVATE HEALTH INSURANCE

## 2018-09-19 DIAGNOSIS — R40243 Glasgow coma scale score 3-8, unspecified time: Secondary | ICD-10-CM

## 2018-09-19 DIAGNOSIS — R4182 Altered mental status, unspecified: Secondary | ICD-10-CM

## 2018-09-19 LAB — CBC
HCT: 35 % — ABNORMAL LOW (ref 39.0–52.0)
HEMOGLOBIN: 11.2 g/dL — AB (ref 13.0–17.0)
MCH: 28.5 pg (ref 26.0–34.0)
MCHC: 32 g/dL (ref 30.0–36.0)
MCV: 89.1 fL (ref 80.0–100.0)
NRBC: 0 % (ref 0.0–0.2)
PLATELETS: 95 10*3/uL — AB (ref 150–400)
RBC: 3.93 MIL/uL — AB (ref 4.22–5.81)
RDW: 14.5 % (ref 11.5–15.5)
WBC: 12.5 10*3/uL — AB (ref 4.0–10.5)

## 2018-09-19 LAB — URINE CULTURE: CULTURE: NO GROWTH

## 2018-09-19 LAB — GLUCOSE, CAPILLARY
GLUCOSE-CAPILLARY: 102 mg/dL — AB (ref 70–99)
GLUCOSE-CAPILLARY: 102 mg/dL — AB (ref 70–99)
Glucose-Capillary: 117 mg/dL — ABNORMAL HIGH (ref 70–99)
Glucose-Capillary: 130 mg/dL — ABNORMAL HIGH (ref 70–99)
Glucose-Capillary: 126 mg/dL — ABNORMAL HIGH (ref 70–99)

## 2018-09-19 LAB — COMPREHENSIVE METABOLIC PANEL
ALBUMIN: 2.3 g/dL — AB (ref 3.5–5.0)
ALT: 45 U/L — ABNORMAL HIGH (ref 0–44)
ANION GAP: 7 (ref 5–15)
AST: 81 U/L — AB (ref 15–41)
Alkaline Phosphatase: 36 U/L — ABNORMAL LOW (ref 38–126)
BUN: 22 mg/dL (ref 8–23)
CHLORIDE: 116 mmol/L — AB (ref 98–111)
CO2: 23 mmol/L (ref 22–32)
Calcium: 7.2 mg/dL — ABNORMAL LOW (ref 8.9–10.3)
Creatinine, Ser: 1.15 mg/dL (ref 0.61–1.24)
GFR calc Af Amer: >60 mL/min (ref >60)
GFR calc non Af Amer: >60 mL/min (ref >60)
GLUCOSE: 116 mg/dL — AB (ref 70–99)
POTASSIUM: 3.6 mmol/L (ref 3.5–5.1)
SODIUM: 146 mmol/L — AB (ref 135–145)
TOTAL PROTEIN: 5.4 g/dL — AB (ref 6.5–8.1)
Total Bilirubin: 0.8 mg/dL (ref 0.3–1.2)

## 2018-09-19 LAB — POCT I-STAT 3, ART BLOOD GAS (G3+)
ACID-BASE DEFICIT: 4 mmol/L — AB (ref 0.0–2.0)
BICARBONATE: 19.4 mmol/L — AB (ref 20.0–28.0)
O2 SAT: 95 %
PCO2 ART: 30 mmHg — AB (ref 32.0–48.0)
PO2 ART: 74 mmHg — AB (ref 83.0–108.0)
TCO2: 20 mmol/L — ABNORMAL LOW (ref 22–32)
pH, Arterial: 7.418 (ref 7.350–7.450)

## 2018-09-19 LAB — PHOSPHORUS: PHOSPHORUS: 1.4 mg/dL — AB (ref 2.5–4.6)

## 2018-09-19 LAB — BODY FLUID CELL COUNT WITH DIFFERENTIAL
EOS FL: 0 %
LYMPHS FL: 2 %
Monocyte-Macrophage-Serous Fluid: 3 % — ABNORMAL LOW (ref 50–90)
NEUTROPHIL FLUID: 95 % — AB (ref 0–25)
Total Nucleated Cell Count, Fluid: 780 cu mm (ref 0–1000)

## 2018-09-19 LAB — MAGNESIUM: MAGNESIUM: 1.9 mg/dL (ref 1.7–2.4)

## 2018-09-19 LAB — CK: Total CK: 733 U/L — ABNORMAL HIGH (ref 49–397)

## 2018-09-19 LAB — BRAIN NATRIURETIC PEPTIDE: B NATRIURETIC PEPTIDE 5: 247.7 pg/mL — AB (ref 0.0–100.0)

## 2018-09-19 MED ORDER — MIDAZOLAM HCL 2 MG/2ML IJ SOLN
2.0000 mg | INTRAMUSCULAR | Status: DC | PRN
  Administered 2018-09-19 – 2018-09-21 (×16): 2 mg via INTRAVENOUS
  Filled 2018-09-19 (×17): qty 2

## 2018-09-19 MED ORDER — ETOMIDATE 2 MG/ML IV SOLN
20.0000 mg | Freq: Once | INTRAVENOUS | Status: AC
  Administered 2018-09-19: 20 mg via INTRAVENOUS

## 2018-09-19 MED ORDER — MIDAZOLAM HCL 2 MG/2ML IJ SOLN
2.0000 mg | Freq: Once | INTRAMUSCULAR | Status: AC
  Administered 2018-09-19: 2 mg via INTRAVENOUS

## 2018-09-19 MED ORDER — FENTANYL 2500MCG IN NS 250ML (10MCG/ML) PREMIX INFUSION
0.0000 ug/h | INTRAVENOUS | Status: DC
  Administered 2018-09-19: 50 ug/h via INTRAVENOUS
  Filled 2018-09-19 (×2): qty 250

## 2018-09-19 MED ORDER — ACETAMINOPHEN 160 MG/5ML PO SOLN
650.0000 mg | ORAL | Status: DC | PRN
  Administered 2018-09-19 (×2): 650 mg
  Filled 2018-09-19 (×2): qty 20.3

## 2018-09-19 MED ORDER — MIDAZOLAM HCL 2 MG/2ML IJ SOLN
2.0000 mg | Freq: Once | INTRAMUSCULAR | Status: AC | PRN
  Administered 2018-09-19: 2 mg via INTRAVENOUS
  Filled 2018-09-19: qty 2

## 2018-09-19 MED ORDER — ETOMIDATE 2 MG/ML IV SOLN
INTRAVENOUS | Status: AC
  Administered 2018-09-19: 20 mg via INTRAVENOUS
  Filled 2018-09-19: qty 10

## 2018-09-19 MED ORDER — SODIUM CHLORIDE 0.9 % IV BOLUS
500.0000 mL | Freq: Once | INTRAVENOUS | Status: AC
  Administered 2018-09-19: 500 mL via INTRAVENOUS

## 2018-09-19 NOTE — Progress Notes (Signed)
09/19/18 @ 1428  Pt has new [employer] pacemaker implanted on 10./17/19. Per Dr. Johney Frame pacer is too acute for scanning. Cannot be scanned. RN aware. Pt is on ventilation.

## 2018-09-19 NOTE — Progress Notes (Signed)
Pt has received so far this shift a total of 5 pushes of IV Fentanyl (50 mcg) for agitation; pt thrashes head back and forth and has non-purposeful movement in all extremities. Doses appear to be effective but effects wear off after approximately one hour. eLink called to request continuous fentanyl infusion. Will continue to monitor.  Herma Ard, RN

## 2018-09-19 NOTE — Procedures (Signed)
Bronchoscopy Procedure Note Billy Simon [id number] 28-Oct-1956  Procedure: Bronchoscopy Indications: Diagnostic evaluation of the airways and Obtain specimens for culture and/or other diagnostic studies  Procedure Details Consent: Risks of procedure as well as the alternatives and risks of each were explained to the (patient/caregiver).  Consent for procedure obtained. Time Out: Verified patient identification, verified procedure, site/side was marked, verified correct patient position, special equipment/implants available, medications/allergies/relevent history reviewed, required imaging and test results available.  Performed  In preparation for procedure, patient was given 100% FiO2 and bronchoscope lubricated. Sedation: Benzodiazepines and Etomidate  Airway entered and the following bronchi were examined: RUL, RML, RLL, LUL, LLL and Bronchi.  Mucopurulent secretions noted from bilateral lower lobes which were suctioned off.   BAL performed in left lower lobe. 60 CC X 3 aliquots instilled and got a return of 100 bloody appearing fluid. Serial aliquots were not increasingly bloody. Combined specimens was sent for culture, cytology and cell counts  Evaluation Hemodynamic Status: BP stable throughout; O2 sats: stable throughout Patient's Current Condition: stable Specimens:  Sent mucopurulent fluid Complications: No apparent complications Patient did tolerate procedure well.   Billy Simon 09/19/2018

## 2018-09-19 NOTE — Consult Note (Addendum)
NEURO HOSPITALIST CONSULT NOTE   Requestig physician: Dr. Martinique  Reason for Consult: AMS with agitation.   History obtained from:  Chart     HPI:                                                                                                                                          Billy Simon is an 62 y.o. male who was admitted on 10/15 for complete heart block requiring temporary pacemaker insertion and intubation. Son states that onset of symptoms was at home with back to back fainting spells between which the patient was conversant. No seizure like activity endorsed. After extubation on 10/16 he became agitated, requiring Precedx. He was reintubated on 10/17 for permanent pacemaker insertion and kept on the vent due to agitation. An EEG and head CT were obtained. CT without acute abnormality, but with mildly enlarged ventricles and right frontal sinusitis. EEG showed diffuse mild slowing without seizures. He had continued agitation and was started on broad spectrum antibiotics for persistent fevers yesterday.  He is on cefazolin, vancomycin and Zosyn.   Family denies EtOH use at home. He takes Xanax PRN anxiety; his wife controls this medication and states there is no chance that he is abusing it.   Current problem list: - Vent dependent respiratory failure in the setting of complete heart block complicated by hypoxia and acidosis - Severe agitation and encephalopathy - Mild renal insufficiency - Complete heart block, s/p permanent pacemaker placement on 10/18  The patient is on risperdal at home as well as serotonergic medications Celexa, Wellbutrin and buspar. Wife states that he has been taking these medications for many years and has not had any recent dosage changes or new additions to his regimen.   Past Medical History:  Diagnosis Date  . CHB (complete heart block) ([place]) 09/14/2018  . Syncope 08/2018   Hospitalized at [facility]    Past Surgical  History:  Procedure Laterality Date  . LEFT HEART CATH AND CORONARY ANGIOGRAPHY N/A 09/14/2018   Procedure: LEFT HEART CATH AND CORONARY ANGIOGRAPHY;  Surgeon: Martinique, Peter M, MD;  Location: [place] [facility];  Service: Cardiovascular;  Laterality: N/A;  . PACEMAKER IMPLANT N/A 09/16/2018   Procedure: PACEMAKER IMPLANT;  Surgeon: Constance Haw, MD;  Location: [place] [facility];  Service: Cardiovascular;  Laterality: N/A;  . TEMPORARY PACEMAKER N/A 09/14/2018   Procedure: TEMPORARY PACEMAKER;  Surgeon: Martinique, Peter M, MD;  Location: [place] [facility];  Service: Cardiovascular;  Laterality: N/A;    No family history on file.            Social History:  reports that he has never smoked. He has never used smokeless tobacco. His alcohol and drug histories are not on file.  No  Known Allergies  MEDICATIONS:                                                                                                                     Scheduled: . benztropine mesylate  1 mg Intravenous Daily  . buPROPion  100 mg Oral TID  . busPIRone  15 mg Oral BID  . chlorhexidine  60 mL Topical Once  . chlorhexidine  60 mL Topical Once  . chlorhexidine gluconate (MEDLINE KIT)  15 mL Mouth Rinse BID  . Chlorhexidine Gluconate Cloth  6 each Topical Q0600  . citalopram  40 mg Per Tube Daily  . heparin  5,000 Units Subcutaneous Q8H  . mouth rinse  15 mL Mouth Rinse 10 times per day  . risperiDONE  2 mg Sublingual TID  . sodium chloride flush  10-40 mL Intracatheter Q12H   Continuous: . sodium chloride    . sodium chloride 10 mL/hr at 09/19/18 0700  . sodium chloride Stopped (09/19/18 0514)  . dexmedetomidine (PRECEDEX) IV infusion 1.2 mcg/kg/hr (09/19/18 0735)  . famotidine (PEPCID) IV 20 mg (09/19/18 1000)  . feeding supplement (VITAL AF 1.2 CAL) 1,000 mL (09/19/18 1000)  . fentaNYL infusion INTRAVENOUS 125 mcg/hr (09/19/18 0700)  . piperacillin-tazobactam (ZOSYN)  IV 12.5 mL/hr at 09/19/18 0700  .  vancomycin Stopped (09/18/18 2345)     ROS:                                                                                                                                       Unable to obtain due to AMS.    Blood pressure 94/61, pulse 74, temperature (!) 100.5 F (38.1 C), temperature source Axillary, resp. rate (!) 26, height 6' (1.829 m), weight 97.7 kg, SpO2 90 %.   General Examination:                                                                                                       Physical Exam  HEENT-  [place]/AT    Lungs- Intubated on ventilator Extremities- Warm. No cyanosis or pallor.   Neurological Examination Mental Status: Sedated on Precedex and fentanyl. No spontaneous or stimulus-induced eye opening. Does not respond to verbal commands. No attempts to communicate. No purposeful movements.  Cranial Nerves: II: Pupils unreactive (sedated). No blink to threat. No eye closure in response to bright light.   III,IV, VI: Weak oculocephalic reflex. Eyes midline without forced gaze deviation.  V,VII: Face flaccidly symmetric. No response to tactile stimulation.  VIII: No response to voice.  IX,X: Intubated XI: Head is midline XII: Intubated.  Motor/Sensory: Flaccid tone x 4. No movement to noxious stimuli x 4 (sedated) Deep Tendon Reflexes: Hypoactive reflexes in upper and lower extremities.  Plantars: Mute bilaterally  Cerebellar/Gait: Unable to assess   Lab Results: Basic Metabolic Panel: Recent Labs  Lab 09/14/18 0741  09/14/18 0823 09/14/18 1042 09/15/18 0251 09/16/18 0418 09/18/18 1229 09/19/18 0342  NA 141   < > 145  --  141 143 143 146*  K 4.1   < > 3.7  --  4.7 4.0 3.2* 3.6  CL 107   < > 106  --  109 108 112* 116*  CO2 17*  --   --   --  _0 GLUCOSE 161*   < > 155*  --  115* 113* 120* 116*  BUN 17   < > 17  --  _1 CREATININE 1.61*   < > 1.30* 1.13 1.54* 1.47* 1.04 1.15  CALCIUM 8.7*  --   --   --  8.8* 8.4* 7.4* 7.2*  MG  --    --   --  1.3* 1.7 1.8  --  1.9  PHOS  --   --   --   --  3.8 3.0  --  1.4*   < > = values in this interval not displayed.    CBC: Recent Labs  Lab 09/14/18 0741  09/14/18 1042 09/15/18 0905 09/16/18 0806 09/18/18 1229 09/19/18 0342  WBC 11.6*  --  14.7* 13.6* 16.2* 11.5* 12.5*  NEUTROABS 7.0  --   --  10.1*  --  9.8*  --   HGB 14.5   < > 13.1 13.9 13.4 11.8* 11.2*  HCT 46.0   < > 41.1 44.7 41.8 36.8* 35.0*  MCV 92.0  --  88.6 90.5 89.7 88.0 89.1  PLT 263  --  246 171 158 109* 95*   < > = values in this interval not displayed.    Cardiac Enzymes: Recent Labs  Lab 09/14/18 1042 09/14/18 1431 09/14/18 2214 09/18/18 1229  TROPONINI 0.07* 0.20* 0.26* 0.07*    Lipid Panel: No results for input(s): CHOL, TRIG, HDL, CHOLHDL, VLDL, LDLCALC in the last 168 hours.  Imaging: Dg Abd 1 View  Result Date: 09/17/2018 CLINICAL DATA:  Feeding tube placement EXAM: ABDOMEN - 1 VIEW COMPARISON:  None. FINDINGS: Feeding tube passes through the stomach into the duodenum. Feeding tube tip near the ligament of Treitz. Normal bowel gas pattern. IMPRESSION: Feeding tube in the region of the ligament of Treitz. Electronically Signed   By: Franchot Gallo M.D.   On: 09/17/2018 14:02   Dg Chest Port 1 View  Result Date: 09/18/2018 CLINICAL DATA:  Intubated EXAM: PORTABLE CHEST 1 VIEW COMPARISON:  09/18/2018 FINDINGS: Endotracheal tube is 4 cm above the carina. Left pacer remains in place with leads in the right atrium and right ventricle. Right internal jugular central  line tip is in the SVC. Feeding tube enters the stomach. Patchy bilateral airspace opacities are again noted. Heart is borderline in size. No effusions or acute bony abnormality. IMPRESSION: Patchy bilateral airspace disease, not significantly changed. Borderline heart size. Endotracheal tube 4 cm above the carina. Electronically Signed   By: Rolm Baptise M.D.   On: 09/18/2018 17:47   Dg Chest Port 1 View  Result Date:  09/18/2018 CLINICAL DATA:  Respiratory failure.  Central line placement EXAM: PORTABLE CHEST 1 VIEW COMPARISON:  09/18/2018 FINDINGS: There is a left chest wall pacer device with leads in the right atrial appendage and right ventricle. ET tube tip is above the carina. There is an enteric to with tip well below the GE junction. Right IJ catheter has been placed with tip projecting over the distal SVC. No pneumothorax. Low lung volumes are identified increasing opacity within the left midlung and left base noted. IMPRESSION: 1. Interval placement of right IJ catheter with tip in the projection of the distal SVC. No pneumothorax identified. 2. Worsening aeration to the left lung. Electronically Signed   By: Kerby Moors M.D.   On: 09/18/2018 13:06   Dg Chest Port 1 View  Result Date: 09/18/2018 CLINICAL DATA:  Acute respiratory failure. EXAM: PORTABLE CHEST 1 VIEW COMPARISON:  09/17/2018 FINDINGS: Left-sided pacemaker unchanged. Endotracheal tube has tip 6 cm above the carina. Enteric tube courses to the region of the stomach and off the inferior portion of the film. Short segment of catheter over the left neck of uncertain significance. Lungs are hypoinflated with minimal opacification over the right base and left retrocardiac region without significant change likely atelectasis and less likely infection. No effusion or pneumothorax. Cardiomediastinal silhouette and remainder of the exam is unchanged. IMPRESSION: Mild hazy opacification over the right base and left retrocardiac region without significant change likely atelectasis although infection is possible. Tubes and lines as described. Short segment of catheter over the left neck base of uncertain clinical significance. Electronically Signed   By: Marin Olp M.D.   On: 09/18/2018 09:05   [place] Ekg Site Rite  Result Date: 09/18/2018 If Site Rite image not attached, placement could not be confirmed due to current cardiac rhythm.   Assessment: 62  year old male with AMS and agitation after admission for syncopal spells at home secondary to complete heart block requiring pacemaker placement. Also with vent dependent respiratory failure complicated by hypoxia and acidosis 1. Not likely to be serotonin syndrome as he has been stable on his antidepressants for several years without any recent changes, per his wife. 2. Also unlikely to be NMS as there have been no new additions to his neuroleptic medication regimen. Fever most likely due to infection.  3. CT without acute abnormality, but with mildly enlarged ventricles and right frontal sinusitis.  4. EEG showed diffuse mild slowing without seizures. 5. I have watched a video of one of the patient's spells of agitation shown me by his son. The patient appears semiconscious in the video with what appears most consistent to be choreiform movements and an agitated state. Chorea due to anoxia, medication side effect or benztropine withdrawal. Of note, case reports document chorea as a rare side effect of fentanyl. I could find no such reports for Precedex.  6. Benzodiazepine withdrawal is felt to be unlikely as he is on relatively low intermittent doses of Xanax at home, with his wife maintaining control over administration of this medication.   Recommendations: 1. MRI brain as soon  as feasible.  2. Trial off of one of his antidepressants may be indicated if MRI negative. However, this should be done with caution as the patient's movements started and were at their worst when he was off his psychiatric medications and have subsequently improved somewhat after restarting them. However, a medication side effect or withdrawal syndrome is not able to completely explain his unresponsiveness on today's exam after holding Precedex and fentanyl for > 30 minutes 3. Lower his dose of risperdal to 1 mg TID and assess for possible improvement 4. Continue benztropine 5. Consider switching fentanyl to an alternate  sedating medication. Versed would be a good alternative due to its GABAergic effects.   60 minutes spent in the bedside evaluation of this critically ill patient.   Electronically signed: Dr. Kerney Elbe 09/19/2018, 10:21 AM

## 2018-09-19 NOTE — Progress Notes (Signed)
Patient ID: Billy Simon, male   DOB: March 12, 1956, 62 y.o.   MRN: [id number]  Discussed with Dr. Isaiah Serge.  Patient was followed by EP to get PPM, now they have signed off.  No other active cardiac issues at this time.  Dr. Isaiah Serge agrees to take on CCM service (thanks).   [name] 09/19/2018 9:53 AM

## 2018-09-19 NOTE — Progress Notes (Addendum)
PULMONARY / CRITICAL CARE MEDICINE   NAME:  Billy Simon, MRN:  [id number], DOB:  08/11/1956, LOS: 5 ADMISSION DATE:  09/14/2018, CONSULTATION DATE: 09/14/2018  REFERRING MD: Cardiology, CHIEF COMPLAINT: Complete heart block  BRIEF HISTORY:    62 year old with h/o complete heart block required a cath and temporary femoral pacer insertion on 09/14/2018.. Intubated for respiratory failure with hypoxia, hypercarbia, bloody frothy sputum.  Briefly on pressors for hypotension.  SIGNIFICANT PAST MEDICAL HISTORY   Depression and anxiety  SIGNIFICANT EVENTS:  09/14/18 Admitted with complete heart block requiring temporary pacer insertion and intubation. 10/16 Extubated. Started on precedex for agitation 10/17 Reintubated for permanent pacemaker insertion.  Kept on vent due to agitation. 10/19 Ongoing agitation, head CT and EEG are unremarkable. Started on broad abx for persistent fevers, trach seceretions  STUDIES:    2D echo- 09/14/2018 Focal basal hypertrophy, LVEF 60-65%, paradoxical septal motion consistent with right ventricular pacing. CT head 09/16/2018- mild ventricular megaly, no acute intracranial findings, right frontal sinusitis. EEG 10/90/19-mild diffuse slowing, no seizures.  Chest x-ray 10/20-ET tube, central line in good position, diffuse bilateral infiltrate.  I reviewed the images personally. I reviewed the images personally.  CULTURES:  MRSA PCR 10/15 - positive Blood culture 10/17 - Blood cultures 10/19 - Trach aspirate 10/19 -gram-positive rod, GPC, GNR, gram-negative coccobacilli. Urine culture 10/19-   ANTIBIOTICS:  Cefazolin 10/17- 10/18 Vanco 10/19 > Zosyn 10/19 >  LINES/TUBES:  09/14/2018 endotracheal tube>>10/16, retubed 10/17 >> 09/14/2018 right femoral pacing>> 10/17 Rt IJ CVL10/19 >>  CONSULTANTS:  09/14/18 pulmonary critical care  SUBJECTIVE:  Continues to have agitation.  Now on Precedex, fentanyl drip Spiked fever again At time of my  examination he is sedated on the vent.  CONSTITUTIONAL: BP (!) 99/58   Pulse 73   Temp (!) 102.3 F (39.1 C) (Oral)   Resp (!) 25   Ht 6' (1.829 m)   Wt 97.7 kg   SpO2 96%   BMI 29.21 kg/m   I/O last 3 completed shifts: In: 6515.6 [I.V.:2833.1; NG/GT:2920; IV Piggyback:762.6] Out: 2090 [Urine:2090]   Vent Mode: PRVC FiO2 (%):  [40 %-100 %] 50 % Set Rate:  [18 bmp] 18 bmp Vt Set:  [620 mL] 620 mL PEEP:  [5 cmH20] 5 cmH20 Plateau Pressure:  [17 cmH20-20 cmH20] 17 cmH20  PHYSICAL EXAM: Gen:      No acute distress HEENT:  EOMI, sclera anicteric, ETT Neck:     No masses; no thyromegaly Lungs:    Clear to auscultation bilaterally; normal respiratory effort CV:         Regular rate and rhythm; no murmurs Abd:      + bowel sounds; soft, non-tender; no palpable masses, no distension Ext:    No edema; adequate peripheral perfusion Skin:      Warm and dry; no rash Neuro: Sedated, intubated.  Has rhythmic movements of the head and upper extremities when sedation is weaned.  RESOLVED PROBLEM LIST  Hypotension  ASSESSMENT AND PLAN   Vent dependent respiratory failure in the setting of complete heart block complicated by hypoxia and acidosis. Now likely developed HCAP Continues on the vent.  Agitation remains a barrier to extubation Noted to have increasing purulent secretion.  Plan for bronchoscopy. Follow cultures, continue Vanco, Zosyn.  Severe agitation, encephalopathy No EtOH use per family History of anxiety, depression. He has significant psychiatric history.   Continue psychiatric medication via tube Neuro consult for possible serotonin syndrome, NMS.  Check CK  Complete heart block status post  right femoral pacing wire placed 09/14/2018 Permanent pacemaker 10/18 Tele monitoring  Mild renal insufficiency > improving Post cardiac cath 09/14/2018 Follow urine output and creatinine  GI Tube feeds via [facility]  SUMMARY OF TODAY'S PLAN:  Continue antibiotics, plan  for bronchoscopy today Continue vent support, neuro consult  Best Practice / Goals of Care / Disposition.   DVT PROPHYLAXIS: Hep SQ SUP: Pepcid NUTRITION: Tube feeds MOBILITY: Bedrest GOALS OF CARE: Code full FAMILY DISCUSSIONS: Wife updated daily. DISPOSITION ICU.  LABS  Glucose Recent Labs  Lab 09/18/18 0737 09/18/18 1232 09/18/18 1614 09/18/18 2105 09/18/18 2336 09/19/18 0340  GLUCAP 100* 113* 111* 113* 113* 117*    BMET Recent Labs  Lab 09/16/18 0418 09/18/18 1229 09/19/18 0342  NA 143 143 146*  K 4.0 3.2* 3.6  CL 108 112* 116*  CO2 22 23 23   BUN 20 19 22   CREATININE 1.47* 1.04 1.15  GLUCOSE 113* 120* 116*    Liver Enzymes Recent Labs  Lab 09/14/18 0741 09/18/18 1229 09/19/18 0342  AST 33 96* 81*  ALT 27 46* 45*  ALKPHOS 48 35* 36*  BILITOT 0.9 0.8 0.8  ALBUMIN 3.6 2.5* 2.3*    Electrolytes Recent Labs  Lab 09/15/18 0251 09/16/18 0418 09/18/18 1229 09/19/18 0342  CALCIUM 8.8* 8.4* 7.4* 7.2*  MG 1.7 1.8  --  1.9  PHOS 3.8 3.0  --  1.4*    CBC Recent Labs  Lab 09/16/18 0806 09/18/18 1229 09/19/18 0342  WBC 16.2* 11.5* 12.5*  HGB 13.4 11.8* 11.2*  HCT 41.8 36.8* 35.0*  PLT 158 109* 95*    ABG Recent Labs  Lab 09/16/18 0557 09/16/18 1506 09/19/18 0432  PHART 7.396 7.420 7.418  PCO2ART 42.0 36.5 30.0*  PO2ART 69.0* 131.0* 74.0*    Coag's Recent Labs  Lab 09/14/18 0741  INR 1.17    Sepsis Markers Recent Labs  Lab 09/16/18 1706 09/17/18 0255 09/18/18 0243 09/18/18 1229  LATICACIDVEN  --   --   --  1.7  PROCALCITON 0.25 0.72 0.82  --     Cardiac Enzymes Recent Labs  Lab 09/14/18 1431 09/14/18 2214 09/18/18 1229  TROPONINI 0.20* 0.26* 0.07*   The patient is critically ill with multiple organ system failure and requires high complexity decision making for assessment and support, frequent evaluation and titration of therapies, advanced monitoring, review of radiographic studies and interpretation of complex  data.   Critical Care Time devoted to patient care services, exclusive of separately billable procedures, described in this note is 35 minutes.   Chilton Greathouse MD [facility] Pager [phone] If no answer or after 3pm call: [phone] 09/19/2018, 7:37 AM

## 2018-09-20 ENCOUNTER — Inpatient Hospital Stay (HOSPITAL_COMMUNITY): Payer: PRIVATE HEALTH INSURANCE

## 2018-09-20 DIAGNOSIS — J80 Acute respiratory distress syndrome: Secondary | ICD-10-CM

## 2018-09-20 DIAGNOSIS — R259 Unspecified abnormal involuntary movements: Secondary | ICD-10-CM

## 2018-09-20 DIAGNOSIS — J189 Pneumonia, unspecified organism: Secondary | ICD-10-CM

## 2018-09-20 LAB — CBC
HCT: 34.9 % — ABNORMAL LOW (ref 39.0–52.0)
HEMOGLOBIN: 11.3 g/dL — AB (ref 13.0–17.0)
MCH: 29 pg (ref 26.0–34.0)
MCHC: 32.4 g/dL (ref 30.0–36.0)
MCV: 89.5 fL (ref 80.0–100.0)
NRBC: 0 % (ref 0.0–0.2)
Platelets: 122 10*3/uL — ABNORMAL LOW (ref 150–400)
RBC: 3.9 MIL/uL — AB (ref 4.22–5.81)
RDW: 15.1 % (ref 11.5–15.5)
WBC: 14.7 10*3/uL — ABNORMAL HIGH (ref 4.0–10.5)

## 2018-09-20 LAB — POCT I-STAT 3, ART BLOOD GAS (G3+)
Acid-base deficit: 3 mmol/L — ABNORMAL HIGH (ref 0.0–2.0)
Bicarbonate: 21.6 mmol/L (ref 20.0–28.0)
O2 Saturation: 97 %
PCO2 ART: 37.5 mmHg (ref 32.0–48.0)
PO2 ART: 93 mmHg (ref 83.0–108.0)
TCO2: 23 mmol/L (ref 22–32)
pH, Arterial: 7.37 (ref 7.350–7.450)

## 2018-09-20 LAB — GLUCOSE, CAPILLARY
GLUCOSE-CAPILLARY: 118 mg/dL — AB (ref 70–99)
GLUCOSE-CAPILLARY: 125 mg/dL — AB (ref 70–99)
GLUCOSE-CAPILLARY: 127 mg/dL — AB (ref 70–99)
GLUCOSE-CAPILLARY: 137 mg/dL — AB (ref 70–99)
GLUCOSE-CAPILLARY: 141 mg/dL — AB (ref 70–99)
Glucose-Capillary: 141 mg/dL — ABNORMAL HIGH (ref 70–99)

## 2018-09-20 LAB — ACID FAST SMEAR (AFB, MYCOBACTERIA): Acid Fast Smear: NEGATIVE

## 2018-09-20 LAB — BASIC METABOLIC PANEL
ANION GAP: 4 — AB (ref 5–15)
BUN: 25 mg/dL — ABNORMAL HIGH (ref 8–23)
CO2: 25 mmol/L (ref 22–32)
Calcium: 6.7 mg/dL — ABNORMAL LOW (ref 8.9–10.3)
Chloride: 120 mmol/L — ABNORMAL HIGH (ref 98–111)
Creatinine, Ser: 1.27 mg/dL — ABNORMAL HIGH (ref 0.61–1.24)
GFR calc non Af Amer: 59 mL/min — ABNORMAL LOW (ref >60)
GLUCOSE: 140 mg/dL — AB (ref 70–99)
POTASSIUM: 3.9 mmol/L (ref 3.5–5.1)
Sodium: 149 mmol/L — ABNORMAL HIGH (ref 135–145)

## 2018-09-20 LAB — CULTURE, RESPIRATORY W GRAM STAIN: Culture: NORMAL

## 2018-09-20 LAB — CULTURE, RESPIRATORY

## 2018-09-20 LAB — VANCOMYCIN, TROUGH: Vancomycin Tr: 9 ug/mL — ABNORMAL LOW (ref 15–20)

## 2018-09-20 LAB — MAGNESIUM: Magnesium: 2 mg/dL (ref 1.7–2.4)

## 2018-09-20 LAB — PHOSPHORUS: PHOSPHORUS: 1.6 mg/dL — AB (ref 2.5–4.6)

## 2018-09-20 MED ORDER — POTASSIUM PHOSPHATES 15 MMOLE/5ML IV SOLN
30.0000 mmol | Freq: Once | INTRAVENOUS | Status: AC
  Administered 2018-09-20: 30 mmol via INTRAVENOUS
  Filled 2018-09-20: qty 10

## 2018-09-20 MED ORDER — VANCOMYCIN HCL 10 G IV SOLR
1500.0000 mg | Freq: Three times a day (TID) | INTRAVENOUS | Status: DC
  Administered 2018-09-20 – 2018-09-21 (×3): 1500 mg via INTRAVENOUS
  Filled 2018-09-20 (×5): qty 1500

## 2018-09-20 MED ORDER — RISPERIDONE 1 MG/ML PO SOLN
1.0000 mg | Freq: Three times a day (TID) | ORAL | Status: DC
  Administered 2018-09-20 – 2018-09-21 (×3): 1 mg
  Filled 2018-09-20 (×3): qty 1

## 2018-09-20 MED ORDER — FREE WATER
200.0000 mL | Status: DC
  Administered 2018-09-20 – 2018-09-23 (×18): 200 mL

## 2018-09-20 NOTE — Progress Notes (Signed)
Spoke with Magdalene Molly NP, with Dr. Johney Frame regarding pacemaker and MRI. Pt will need to wait 6 weeks before MRI.

## 2018-09-20 NOTE — Progress Notes (Signed)
Pharmacy Antibiotic Note  Billy Simon is a 62 y.o. male admitted on 09/14/2018 with CHB with perm pacer placed 10/17. Patient remains intubated and sedated but aggitated. Tmax today was 103.2 and WBC up to 14.7. Bcx 10/18 NGTD, but 10/19 resp cultures positive for mod GPR, mod GPCs in clusters, mod GNR, and few GN coccobacilli. Pharmacy has been consulted for Vancomycin and zosyn dosing.   Vancomycin trough drawn appropriately today was 9. Renal function has been stable.  Plan: -Continue zosyn 3.375gm IV q8h EI -Increase Vancomycin to 154m IV q8h -Obtain vancomycin trough at steady state -Monitor renal function, clinical improvement, length of therapy  Height: 6' (182.9 cm) Weight: 219 lb 2.2 oz (99.4 kg) IBW/kg (Calculated) : 77.6  Temp (24hrs), Avg:99.9 F (37.7 C), Min:97.7 F (36.5 C), Max:103.2 F (39.6 C)  Recent Labs  Lab 09/15/18 0251 09/15/18 0905 09/16/18 0418 09/16/18 0806 09/18/18 1229 09/19/18 0342 09/20/18 0355 09/20/18 1002  WBC  --  13.6*  --  16.2* 11.5* 12.5* 14.7*  --   CREATININE 1.54*  --  1.47*  --  1.04 1.15 1.27*  --   LATICACIDVEN  --   --   --   --  1.7  --   --   --   VANCOTROUGH  --   --   --   --   --   --   --  9*    Estimated Creatinine Clearance: 74.6 mL/min (A) (by C-G formula based on SCr of 1.27 mg/dL (H)).    No Known Allergies  Antimicrobials this admission: Vanc 10/19 > Zosyn 10/19 >  Dose adjustments this admission: Vancomycin 1250 IV q12h >> 1500 q8h  Microbiology results: 10/15 MRSA PCR: + 10/18 BCx: NGTD 10/19 resp cx: mod GPR, mod GPCs in clusters, mod GNR, few GN coccobacilli 10/19 Ucx: NGTD 10/20 BAL: no organisms seen  MJackson Latino PharmD PGY1 Pharmacy Resident Phone ([phone]/21/2019     2:54 PM

## 2018-09-20 NOTE — Progress Notes (Signed)
PULMONARY / CRITICAL CARE MEDICINE   NAME:  Billy Simon, MRN:  [id number], DOB:  05/11/1956, LOS: 6 ADMISSION DATE:  09/14/2018, CONSULTATION DATE: 09/14/2018  REFERRING MD: Cardiology, CHIEF COMPLAINT: Complete heart block  BRIEF HISTORY:    62 year old with h/o complete heart block required a cath and temporary femoral pacer insertion on 09/14/2018.. Intubated for respiratory failure with hypoxia, hypercarbia, bloody frothy sputum.  Briefly on pressors for hypotension.  SIGNIFICANT PAST MEDICAL HISTORY   Depression and anxiety  SIGNIFICANT EVENTS:  09/14/18 Admitted with complete heart block requiring temporary pacer insertion and intubation. 10/16 Extubated. Started on precedex for agitation 10/17 Reintubated for permanent pacemaker insertion.  Kept on vent due to agitation. 10/19 Ongoing agitation, head CT and EEG are unremarkable. Started on broad abx for persistent fevers, trach seceretions  STUDIES:    2D echo- 09/14/2018 Focal basal hypertrophy, LVEF 60-65%, paradoxical septal motion consistent with right ventricular pacing. CT head 09/16/2018- mild ventricular megaly, no acute intracranial findings, right frontal sinusitis. EEG 10/90/19-mild diffuse slowing, no seizures.    CULTURES:  MRSA PCR 10/15 - positive Blood culture 10/17 -ng Blood cultures 10/19 -ng Trach aspirate 10/19 - nml flora  resp 10/20 >> Urine culture 10/19-ng   ANTIBIOTICS:  Cefazolin 10/17- 10/18 Vanco 10/19 > Zosyn 10/19 >  LINES/TUBES:  09/14/2018 endotracheal tube>>10/16, retubed 10/17 >> 09/14/2018 right femoral pacing>> 10/17 Rt IJ CVL10/19 >>  CONSULTANTS:  09/14/18 pulmonary critical care  SUBJECTIVE:    Febrile Intermittent agitation, appears uncomfortable , tachypneic on vent  Critically ill, on 80%/ +5  CONSTITUTIONAL: BP (!) 94/56   Pulse 78   Temp (!) 101.6 F (38.7 C) (Axillary)   Resp (!) 30   Ht 6' (1.829 m)   Wt 99.4 kg   SpO2 94%   BMI 29.72 kg/m   I/O  last 3 completed shifts: In: 7199.5 [I.V.:2675.4; NG/GT:2920; IV Piggyback:1604.1] Out: 2115 [Urine:2115]   Vent Mode: PRVC FiO2 (%):  [60 %-100 %] 80 % Set Rate:  [18 bmp] 18 bmp Vt Set:  [409 mL] 620 mL PEEP:  [5 cmH20] 5 cmH20 Plateau Pressure:  [21 cmH20-24 cmH20] 24 cmH20  PHYSICAL EXAM: Gen:      No acute distress HEENT:  EOMI, sclera anicteric, ETT Neck:     No masses; no thyromegaly Lungs:    Clear to auscultation bilaterally; normal respiratory effort CV:         Regular rate and rhythm; no murmurs Abd:      + bowel sounds; soft, non-tender; no palpable masses, no distension Ext:    No edema; adequate peripheral perfusion Skin:      Warm and dry; no rash Neuro: Sedated, intubated.  Has rhythmic movements of the head and upper extremities when sedation is weaned.  RESOLVED PROBLEM LIST  Hypotension  ASSESSMENT AND PLAN   Acute respiratory failure in the setting of complete heart block complicated by hypoxia and acidosis. Now likely developed HCAP / aspiration pneumonia Vent settings reviewed & adjusted Increase PEEP to 10, low TV ventilation Follow cultures, continue Vanco, Zosyn.  Severe agitation, encephalopathy -   Doubt serotonin syndrome, NMS.  CK slight high History of anxiety, depression on psych meds x 30 yrs  Decrease risperdal to 1mg  tid Dc celexa & buspar, ct wellbutrin for now Precedex gtt with goal RASS -1 Family agreeable to int fentanyl - NO DRIP Use intermittent versed if needed MRI planned (can only do 5ds post PPM placement)    Complete heart block status post right  femoral pacing wire placed 09/14/2018 Permanent pacemaker 10/18 Tele monitoring  AKI > improving Post cardiac cath 09/14/2018 Follow urine output and creatinine  Hypophos- replete  GI Tube feeds via Cortrak  SUMMARY OF TODAY'S PLAN:  ARDS due to HCAP  & encephalopathy complicating course He does not have rigidity to go with NMS, also not classic for serotonin  syndrome Family discusion today x 30 mins answering all their questions    Best Practice / Goals of Care / Disposition.    GOALS OF CARE: Code full FAMILY DISCUSSIONS: Wife & son  updated at bedside DISPOSITION ICU.  LABS  Glucose Recent Labs  Lab 09/19/18 1203 09/19/18 1613 09/19/18 2038 09/20/18 0014 09/20/18 0517 09/20/18 0754  GLUCAP 102* 102* 126* 118* 137* 141*    BMET Recent Labs  Lab 09/18/18 1229 09/19/18 0342 09/20/18 0355  NA 143 146* 149*  K 3.2* 3.6 3.9  CL 112* 116* 120*  CO2 23 23 25   BUN 19 22 25*  CREATININE 1.04 1.15 1.27*  GLUCOSE 120* 116* 140*    Liver Enzymes Recent Labs  Lab 09/14/18 0741 09/18/18 1229 09/19/18 0342  AST 33 96* 81*  ALT 27 46* 45*  ALKPHOS 48 35* 36*  BILITOT 0.9 0.8 0.8  ALBUMIN 3.6 2.5* 2.3*    Electrolytes Recent Labs  Lab 09/16/18 0418 09/18/18 1229 09/19/18 0342 09/20/18 0355  CALCIUM 8.4* 7.4* 7.2* 6.7*  MG 1.8  --  1.9 2.0  PHOS 3.0  --  1.4* 1.6*    CBC Recent Labs  Lab 09/18/18 1229 09/19/18 0342 09/20/18 0355  WBC 11.5* 12.5* 14.7*  HGB 11.8* 11.2* 11.3*  HCT 36.8* 35.0* 34.9*  PLT 109* 95* 122*    ABG Recent Labs  Lab 09/16/18 1506 09/19/18 0432 09/20/18 0411  PHART 7.420 7.418 7.370  PCO2ART 36.5 30.0* 37.5  PO2ART 131.0* 74.0* 93.0    Coag's Recent Labs  Lab 09/14/18 0741  INR 1.17    Sepsis Markers Recent Labs  Lab 09/16/18 1706 09/17/18 0255 09/18/18 0243 09/18/18 1229  LATICACIDVEN  --   --   --  1.7  PROCALCITON 0.25 0.72 0.82  --     Cardiac Enzymes Recent Labs  Lab 09/14/18 1431 09/14/18 2214 09/18/18 1229  TROPONINI 0.20* 0.26* 0.07*   The patient is critically ill with multiple organ systems failure and requires high complexity decision making for assessment and support, frequent evaluation and titration of therapies, application of advanced monitoring technologies and extensive interpretation of multiple databases. Critical Care Time devoted to  patient care services described in this note independent of APP/resident  time is 40 minutes.   Comer Locket Vassie Loll MD 09/20/2018, 11:04 AM

## 2018-09-20 NOTE — Progress Notes (Signed)
Subjective: Patient is intubated and sedated.  He is on Versed and in addition he is on Precedex.  He is breathing over the vent.  With agitation he has mild jerking movements of his shoulders and his head.  I did see a video as Dr. Cheral Marker did before but he is not having any of those movements at this time.  He is on 80% oxygen to keep his levels up.  Exam: Vitals:   09/20/18 1548 09/20/18 1600  BP: 101/64 104/65  Pulse: 77 77  Resp: (!) 33 (!) 33  Temp:    SpO2: 99% 98%    Physical Exam   HEENT-  Normocephalic, no lesions, without obvious abnormality.  Normal external eye and conjunctiva.   Extremities-full, dry and intact Musculoskeletal-no joint tenderness, deformity or swelling Skin-cool and appears fluid overload    Neuro:  Mental Status: Intubated and sedated Cranial Nerves: II: No blink to threat III,IV, VI: Oculocephalic reflex intact.  Pupils 2 mm and reactive V,VII: Face symmetric, no response to sensation VIII: Response to verbal stimulation  Motor: Flaccid throughout Sensory: Spots to noxious stimuli Deep Tendon Reflexes: Reflexes throughout Plantars: Mute     Medications:  Scheduled: . benztropine mesylate  1 mg Intravenous Daily  . buPROPion  100 mg Oral TID  . chlorhexidine  60 mL Topical Once  . chlorhexidine  60 mL Topical Once  . chlorhexidine gluconate (MEDLINE KIT)  15 mL Mouth Rinse BID  . Chlorhexidine Gluconate Cloth  6 each Topical Q0600  . free water  200 mL Per Tube Q4H  . heparin  5,000 Units Subcutaneous Q8H  . mouth rinse  15 mL Mouth Rinse 10 times per day  . risperiDONE  1 mg Per Tube TID  . sodium chloride flush  10-40 mL Intracatheter Q12H   Continuous: . sodium chloride    . sodium chloride 10 mL/hr at 09/20/18 1600  . sodium chloride Stopped (09/20/18 1420)  . dexmedetomidine (PRECEDEX) IV infusion 1.8 mcg/kg/hr (09/20/18 1600)  . famotidine (PEPCID) IV Stopped (09/20/18 1057)  . feeding supplement (VITAL AF 1.2 CAL) 80  mL/hr at 09/20/18 1600  . piperacillin-tazobactam (ZOSYN)  IV 12.5 mL/hr at 09/20/18 1600  . potassium PHOSPHATE IVPB (in mmol) 83 mL/hr at 09/20/18 1600  . vancomycin      Pertinent Labs/Diagnostics:   Dg Chest Port 1 View  Result Date: 09/20/2018 CLINICAL DATA:  Respiratory failure EXAM: PORTABLE CHEST 1 VIEW COMPARISON:  09/19/2018 FINDINGS: Cardiac shadow is stable. Pacing device is again noted. Endotracheal tube and feeding catheter is well as a right jugular central line are again seen and stable. Diffuse increased density is noted throughout the right upper lobe significantly increased from the prior exam consistent with acute pneumonia. Diffuse airspace opacities are noted throughout the left lung stable from the prior exam. Improved aeration in the right lung base is noted. No bony abnormality is seen. IMPRESSION: Patchy infiltrates bilaterally. These are worsened in the right upper lobe with improved aeration in the right base. Electronically Signed   By: Inez Catalina M.D.   On: 09/20/2018 08:03   Dg Chest Port 1 View  Result Date: 09/19/2018 CLINICAL DATA:  Acute respiratory failure. EXAM: PORTABLE CHEST 1 VIEW COMPARISON:  09/18/2018 FINDINGS: There is a left chest wall pacer device with lead in the right atrial appendage and right ventricle. The ET tube tip is above the carina. Feeding tube tip is below the level of the GE junction. Diminished lung volumes with asymmetric elevation  of the left hemidiaphragm. Mild diffuse pulmonary edema. Retrocardiac opacity compatible with atelectasis and/or consolidation. Overall aeration to lungs is not significantly changed. IMPRESSION:: IMPRESSION: 1. No change in pulmonary edema and airspace opacities. 2. Stable support apparatus. Electronically Signed   By: Kerby Moors M.D.   On: 09/19/2018 14:34   Dg Chest Port 1 View  Result Date: 09/18/2018 CLINICAL DATA:  Intubated EXAM: PORTABLE CHEST 1 VIEW COMPARISON:  09/18/2018 FINDINGS:  Endotracheal tube is 4 cm above the carina. Left pacer remains in place with leads in the right atrium and right ventricle. Right internal jugular central line tip is in the SVC. Feeding tube enters the stomach. Patchy bilateral airspace opacities are again noted. Heart is borderline in size. No effusions or acute bony abnormality. IMPRESSION: Patchy bilateral airspace disease, not significantly changed. Borderline heart size. Endotracheal tube 4 cm above the carina. Electronically Signed   By: Rolm Baptise M.D.   On: 09/18/2018 17:47        Assessment: 62 year old male with altered mental status and agitation after admission for syncopal spells.  Patient was found at home with complete heart block requiring a pacemaker placement.  Patient currently vent dependent with respiratory failure complicated by hypoxic and acidosis.  He currently is needing 80% oxygen.  He is breathing over the vent at this time EEG has been obtained and did not show any seizures but showed slowing.  CT of head was obtained did not show any acute abnormalities only mildly enlarged ventricles and right frontal sinusitis.       Recommendations: -Continue to evaluate patient sedation decreases.  At this time MRI would be helpful when feasible. - We will reassess when patient is off sedation.  Etta Quill PA-C [facility] [phone]  09/20/2018, 4:15 PM

## 2018-09-20 NOTE — Progress Notes (Signed)
Speech Language Pathology Discharge Patient Details Name: Billy Simon MRN: [id number] DOB: [birth year] Today's Date: 09/20/2018 Time:  -     Patient discharged from SLP services secondary to medical decline - will need to re-order SLP to resume therapy services. Remains intubated.  Please see latest therapy progress note for current level of functioning and progress toward goals.    Progress and discharge plan discussed with patient and/or caregiver: Patient unable to participate in discharge planning and no caregivers available  GO     [name] 09/20/2018, 8:00 AM        [name].Ed Nurse, children's [phone] Office [phone]

## 2018-09-21 ENCOUNTER — Inpatient Hospital Stay (HOSPITAL_COMMUNITY): Payer: PRIVATE HEALTH INSURANCE

## 2018-09-21 DIAGNOSIS — G9340 Encephalopathy, unspecified: Secondary | ICD-10-CM

## 2018-09-21 LAB — GLUCOSE, CAPILLARY
GLUCOSE-CAPILLARY: 128 mg/dL — AB (ref 70–99)
GLUCOSE-CAPILLARY: 131 mg/dL — AB (ref 70–99)
GLUCOSE-CAPILLARY: 145 mg/dL — AB (ref 70–99)
Glucose-Capillary: 130 mg/dL — ABNORMAL HIGH (ref 70–99)
Glucose-Capillary: 130 mg/dL — ABNORMAL HIGH (ref 70–99)
Glucose-Capillary: 125 mg/dL — ABNORMAL HIGH (ref 70–99)

## 2018-09-21 LAB — BASIC METABOLIC PANEL
ANION GAP: 6 (ref 5–15)
BUN: 24 mg/dL — ABNORMAL HIGH (ref 8–23)
CHLORIDE: 120 mmol/L — AB (ref 98–111)
CO2: 22 mmol/L (ref 22–32)
Calcium: 6.9 mg/dL — ABNORMAL LOW (ref 8.9–10.3)
Creatinine, Ser: 1.22 mg/dL (ref 0.61–1.24)
GFR calc Af Amer: >60 mL/min (ref >60)
GFR calc non Af Amer: >60 mL/min (ref >60)
Glucose, Bld: 141 mg/dL — ABNORMAL HIGH (ref 70–99)
Potassium: 3.9 mmol/L (ref 3.5–5.1)
SODIUM: 148 mmol/L — AB (ref 135–145)

## 2018-09-21 LAB — CULTURE, BLOOD (ROUTINE X 2)
CULTURE: NO GROWTH
Culture: NO GROWTH
SPECIAL REQUESTS: ADEQUATE
SPECIAL REQUESTS: ADEQUATE

## 2018-09-21 LAB — CBC
HEMATOCRIT: 32.2 % — AB (ref 39.0–52.0)
HEMOGLOBIN: 10 g/dL — AB (ref 13.0–17.0)
MCH: 28.1 pg (ref 26.0–34.0)
MCHC: 31.1 g/dL (ref 30.0–36.0)
MCV: 90.4 fL (ref 80.0–100.0)
Platelets: 155 10*3/uL (ref 150–400)
RBC: 3.56 MIL/uL — ABNORMAL LOW (ref 4.22–5.81)
RDW: 15.5 % (ref 11.5–15.5)
WBC: 13.9 10*3/uL — ABNORMAL HIGH (ref 4.0–10.5)
nRBC: 0 % (ref 0.0–0.2)

## 2018-09-21 LAB — PHOSPHORUS: PHOSPHORUS: 1.6 mg/dL — AB (ref 2.5–4.6)

## 2018-09-21 LAB — MAGNESIUM: Magnesium: 2 mg/dL (ref 1.7–2.4)

## 2018-09-21 MED ORDER — MIDAZOLAM HCL 2 MG/2ML IJ SOLN
1.0000 mg | INTRAMUSCULAR | Status: DC | PRN
  Administered 2018-09-21 – 2018-09-22 (×8): 2 mg via INTRAVENOUS
  Filled 2018-09-21 (×8): qty 2

## 2018-09-21 MED ORDER — FUROSEMIDE 10 MG/ML IJ SOLN
40.0000 mg | Freq: Three times a day (TID) | INTRAMUSCULAR | Status: DC
  Administered 2018-09-21 – 2018-09-22 (×3): 40 mg via INTRAVENOUS
  Filled 2018-09-21 (×3): qty 4

## 2018-09-21 MED ORDER — LACTULOSE 10 GM/15ML PO SOLN
30.0000 g | Freq: Once | ORAL | Status: AC
  Administered 2018-09-21: 30 g
  Filled 2018-09-21: qty 45

## 2018-09-21 MED ORDER — DOCUSATE SODIUM 50 MG/5ML PO LIQD
100.0000 mg | Freq: Two times a day (BID) | ORAL | Status: DC
  Administered 2018-09-21 – 2018-09-25 (×10): 100 mg
  Filled 2018-09-21 (×10): qty 10

## 2018-09-21 MED ORDER — BISACODYL 10 MG RE SUPP
10.0000 mg | Freq: Every day | RECTAL | Status: DC | PRN
  Administered 2018-09-22: 10 mg via RECTAL
  Filled 2018-09-21: qty 1

## 2018-09-21 MED ORDER — RISPERIDONE 1 MG/ML PO SOLN
1.0000 mg | Freq: Every day | ORAL | Status: DC
  Administered 2018-09-22: 1 mg
  Filled 2018-09-21: qty 1

## 2018-09-21 NOTE — Progress Notes (Addendum)
PULMONARY / CRITICAL CARE MEDICINE   NAME:  Billy Simon, MRN:  [id number], DOB:  1956/06/19, LOS: 7 ADMISSION DATE:  09/14/2018, CONSULTATION DATE: 09/14/2018  REFERRING MD: Cardiology, CHIEF COMPLAINT: Complete heart block  BRIEF HISTORY:    62 year old with h/o complete heart block required a cath and temporary femoral pacer insertion on 09/14/2018.. Intubated for respiratory failure with hypoxia, hypercarbia, bloody frothy sputum.  Briefly on pressors for hypotension.  SIGNIFICANT PAST MEDICAL HISTORY   Depression and anxiety  SIGNIFICANT EVENTS:  09/14/18 Admitted with complete heart block requiring temporary pacer insertion and intubation. 10/16 Extubated. Started on precedex for agitation 10/17 Reintubated for permanent pacemaker insertion.  Kept on vent due to agitation. 10/19 Ongoing agitation, head CT and EEG are unremarkable. Started on broad abx for persistent fevers, trach seceretions  STUDIES:    2D echo- 09/14/2018 Focal basal hypertrophy, LVEF 60-65%, paradoxical septal motion consistent with right ventricular pacing. CT head 09/16/2018- mild ventricular megaly, no acute intracranial findings, right frontal sinusitis. EEG 10/90/19-mild diffuse slowing, no seizures.    CULTURES:  MRSA PCR 10/15 - positive Blood culture 10/17 -ng Blood cultures 10/19 -ng Trach aspirate 10/19 - nml flora  resp 10/20 >> Urine culture 10/19-ng   ANTIBIOTICS:  Cefazolin 10/17- 10/18 Vanco 10/19 >10/22 Zosyn 10/19 >  LINES/TUBES:  09/14/2018 endotracheal tube>>10/16, retubed 10/17 >> 09/14/2018 right femoral pacing>> 10/17 Rt IJ CVL10/19 >>  CONSULTANTS:  09/14/18 pulmonary critical care  SUBJECTIVE:   Remains febrile, critically ill. On 50%/PEEP of 10 Good urine output. Intermittent agitation per RN  CONSTITUTIONAL: BP (!) 118/55   Pulse (!) 109   Temp (!) 100.6 F (38.1 C) (Oral)   Resp (!) 35   Ht 6' (1.829 m)   Wt 104.6 kg   SpO2 92%   BMI 31.28 kg/m   I/O  last 3 completed shifts: In: 5612.7 [I.V.:2278.9; NG/GT:960; IV Piggyback:2373.8] Out: 2545 [Urine:2545]   Vent Mode: PRVC FiO2 (%):  [50 %-80 %] 50 % Set Rate:  [18 bmp] 18 bmp Vt Set:  [620 mL] 620 mL PEEP:  [10 cmH20] 10 cmH20 Plateau Pressure:  [25 cmH20-32 cmH20] 26 cmH20  PHYSICAL EXAM: Obese man, orally intubated, sedated on Precedex. Not following commands, pupils 3 mm bilaterally equally reactive to light, per family cocks eyes when his name is called S1-S2 tacky Decreased breath sounds bilateral, Obese distended abdomen, sluggish breath sounds 2+ bipedal edema  RESOLVED PROBLEM LIST  Hypotension  ASSESSMENT AND PLAN   Acute respiratory failure in the setting of complete heart block complicated by hypoxia and acidosis. Now likely developed HCAP / aspiration pneumonia Vent settings reviewed & adjusted Ct  PEEP to 10, low TV ventilation Drop FiO2 to 40% Follow cultures, dc Vanco, ct  Zosyn.  Severe agitation, encephalopathy -   Doubt serotonin syndrome, NMS.  CK slight high History of anxiety, depression on psych meds x 30 yrs  Decrease risperdal to 1mg  qhs from home dose of 6 mg/day Dc celexa & buspar &  wellbutrin for now Precedex gtt with goal RASS -1 Family agreeable to int fentanyl - NO DRIP Use intermittent versed if needed MRI cannot be done due to PPM for at least 6 weeks -do we have to consider normal pressure hydrocephalus here?    Complete heart block status post right femoral pacing wire placed 09/14/2018 Permanent pacemaker 10/18 Tele monitoring  AKI > improving Post cardiac cath 09/14/2018 Follow urine output and creatinine Lasix 40 every 8  Hypophos- replete  GI Tube feeds via  Cortrak at goal Constipation-use lactulose and if no bowel movement in 8 hours trial of Dulcolax. Also add Colace  SUMMARY OF TODAY'S PLAN:  ARDS due to HCAP  & encephalopathy complicating course He does not have rigidity to go with NMS, also not classic for  serotonin syndrome Updated family today, slightly more responsive which is encouraging.  Emphasized that this will take 5 to 7 days for pneumonia to improve and he will likely be severely deconditioned    Best Practice / Goals of Care / Disposition.    GOALS OF CARE: Code full FAMILY DISCUSSIONS: Wife & son  updated at bedside DISPOSITION ICU.  LABS  Glucose Recent Labs  Lab 09/20/18 1205 09/20/18 1721 09/20/18 2052 09/21/18 0003 09/21/18 0406 09/21/18 0742  GLUCAP 127* 141* 125* 131* 128* 130*    BMET Recent Labs  Lab 09/19/18 0342 09/20/18 0355 09/21/18 0418  NA 146* 149* 148*  K 3.6 3.9 3.9  CL 116* 120* 120*  CO2 23 25 22   BUN 22 25* 24*  CREATININE 1.15 1.27* 1.22  GLUCOSE 116* 140* 141*    Liver Enzymes Recent Labs  Lab 09/18/18 1229 09/19/18 0342  AST 96* 81*  ALT 46* 45*  ALKPHOS 35* 36*  BILITOT 0.8 0.8  ALBUMIN 2.5* 2.3*    Electrolytes Recent Labs  Lab 09/19/18 0342 09/20/18 0355 09/21/18 0418  CALCIUM 7.2* 6.7* 6.9*  MG 1.9 2.0 2.0  PHOS 1.4* 1.6* 1.6*    CBC Recent Labs  Lab 09/19/18 0342 09/20/18 0355 09/21/18 0418  WBC 12.5* 14.7* 13.9*  HGB 11.2* 11.3* 10.0*  HCT 35.0* 34.9* 32.2*  PLT 95* 122* 155    ABG Recent Labs  Lab 09/16/18 1506 09/19/18 0432 09/20/18 0411  PHART 7.420 7.418 7.370  PCO2ART 36.5 30.0* 37.5  PO2ART 131.0* 74.0* 93.0    Coag's No results for input(s): APTT, INR in the last 168 hours.  Sepsis Markers Recent Labs  Lab 09/16/18 1706 09/17/18 0255 09/18/18 0243 09/18/18 1229  LATICACIDVEN  --   --   --  1.7  PROCALCITON 0.25 0.72 0.82  --     Cardiac Enzymes Recent Labs  Lab 09/14/18 1431 09/14/18 2214 09/18/18 1229  TROPONINI 0.20* 0.26* 0.07*   My critical care time x 42m  Billy Mourning MD. FCCP. [facility] Pager [phone] If no response call [phone]    Billy Deem V. Vassie Loll MD 09/21/2018, 11:29 AM

## 2018-09-22 ENCOUNTER — Inpatient Hospital Stay (HOSPITAL_COMMUNITY): Payer: PRIVATE HEALTH INSURANCE

## 2018-09-22 LAB — CULTURE, BAL-QUANTITATIVE: SPECIAL REQUESTS: NORMAL

## 2018-09-22 LAB — GLUCOSE, CAPILLARY
Glucose-Capillary: 110 mg/dL — ABNORMAL HIGH (ref 70–99)
Glucose-Capillary: 122 mg/dL — ABNORMAL HIGH (ref 70–99)
Glucose-Capillary: 126 mg/dL — ABNORMAL HIGH (ref 70–99)
Glucose-Capillary: 126 mg/dL — ABNORMAL HIGH (ref 70–99)
Glucose-Capillary: 133 mg/dL — ABNORMAL HIGH (ref 70–99)
Glucose-Capillary: 155 mg/dL — ABNORMAL HIGH (ref 70–99)
Glucose-Capillary: 57 mg/dL — ABNORMAL LOW (ref 70–99)

## 2018-09-22 LAB — CULTURE, BAL-QUANTITATIVE W GRAM STAIN: Culture: 70000 — AB

## 2018-09-22 LAB — BASIC METABOLIC PANEL WITH GFR
Anion gap: 8 (ref 5–15)
BUN: 27 mg/dL — ABNORMAL HIGH (ref 8–23)
CO2: 28 mmol/L (ref 22–32)
Calcium: 7.1 mg/dL — ABNORMAL LOW (ref 8.9–10.3)
Chloride: 112 mmol/L — ABNORMAL HIGH (ref 98–111)
Creatinine, Ser: 1.21 mg/dL (ref 0.61–1.24)
GFR calc Af Amer: >60 mL/min
GFR calc non Af Amer: >60 mL/min
Glucose, Bld: 136 mg/dL — ABNORMAL HIGH (ref 70–99)
Potassium: 3.5 mmol/L (ref 3.5–5.1)
Sodium: 148 mmol/L — ABNORMAL HIGH (ref 135–145)

## 2018-09-22 LAB — CBC
HCT: 31.8 % — ABNORMAL LOW (ref 39.0–52.0)
Hemoglobin: 10.3 g/dL — ABNORMAL LOW (ref 13.0–17.0)
MCH: 28.6 pg (ref 26.0–34.0)
MCHC: 32.4 g/dL (ref 30.0–36.0)
MCV: 88.3 fL (ref 80.0–100.0)
PLATELETS: 201 10*3/uL (ref 150–400)
RBC: 3.6 MIL/uL — ABNORMAL LOW (ref 4.22–5.81)
RDW: 15.4 % (ref 11.5–15.5)
WBC: 15.7 10*3/uL — ABNORMAL HIGH (ref 4.0–10.5)
nRBC: 0.2 % (ref 0.0–0.2)

## 2018-09-22 LAB — MAGNESIUM: Magnesium: 1.9 mg/dL (ref 1.7–2.4)

## 2018-09-22 LAB — LACTIC ACID, PLASMA: Lactic Acid, Venous: 2.2 mmol/L (ref 0.5–1.9)

## 2018-09-22 LAB — CK TOTAL AND CKMB (NOT AT ARMC)
CK TOTAL: 359 U/L (ref 49–397)
CK, MB: 1.9 ng/mL (ref 0.5–5.0)
Relative Index: 0.5 (ref 0.0–2.5)

## 2018-09-22 LAB — TRIGLYCERIDES: Triglycerides: 213 mg/dL — ABNORMAL HIGH

## 2018-09-22 LAB — PHOSPHORUS: Phosphorus: 1.6 mg/dL — ABNORMAL LOW (ref 2.5–4.6)

## 2018-09-22 LAB — PROCALCITONIN: PROCALCITONIN: 1.49 ng/mL

## 2018-09-22 LAB — BRAIN NATRIURETIC PEPTIDE: B NATRIURETIC PEPTIDE 5: 120 pg/mL — AB (ref 0.0–100.0)

## 2018-09-22 MED ORDER — VITAL AF 1.2 CAL PO LIQD
1000.0000 mL | ORAL | Status: DC
  Administered 2018-09-23: 14:00:00
  Administered 2018-09-26: 1500 mL

## 2018-09-22 MED ORDER — VANCOMYCIN HCL 10 G IV SOLR
1500.0000 mg | Freq: Three times a day (TID) | INTRAVENOUS | Status: DC
  Administered 2018-09-23 (×3): 1500 mg via INTRAVENOUS
  Filled 2018-09-22 (×5): qty 1500

## 2018-09-22 MED ORDER — PRO-STAT SUGAR FREE PO LIQD
30.0000 mL | Freq: Every day | ORAL | Status: DC
  Administered 2018-09-22 – 2018-09-27 (×23): 30 mL
  Filled 2018-09-22 (×23): qty 30

## 2018-09-22 MED ORDER — FUROSEMIDE 10 MG/ML IJ SOLN
60.0000 mg | Freq: Three times a day (TID) | INTRAMUSCULAR | Status: DC
  Administered 2018-09-22 – 2018-09-24 (×8): 60 mg via INTRAVENOUS
  Filled 2018-09-22 (×8): qty 6

## 2018-09-22 MED ORDER — FENTANYL CITRATE (PF) 100 MCG/2ML IJ SOLN
50.0000 ug | INTRAMUSCULAR | Status: AC | PRN
  Administered 2018-09-23 (×3): 50 ug via INTRAVENOUS
  Filled 2018-09-22 (×3): qty 2

## 2018-09-22 MED ORDER — VANCOMYCIN HCL 10 G IV SOLR: 1500.0000 mg | Freq: Three times a day (TID) | INTRAVENOUS | Status: DC

## 2018-09-22 MED ORDER — POTASSIUM PHOSPHATES 15 MMOLE/5ML IV SOLN
30.0000 mmol | Freq: Once | INTRAVENOUS | Status: AC
  Administered 2018-09-22: 30 mmol via INTRAVENOUS
  Filled 2018-09-22: qty 10

## 2018-09-22 MED ORDER — FENTANYL CITRATE (PF) 100 MCG/2ML IJ SOLN
50.0000 ug | INTRAMUSCULAR | Status: DC | PRN
  Administered 2018-09-23 (×2): 50 ug via INTRAVENOUS
  Filled 2018-09-22 (×2): qty 2

## 2018-09-22 MED ORDER — SENNOSIDES 8.8 MG/5ML PO SYRP
5.0000 mL | ORAL_SOLUTION | Freq: Two times a day (BID) | ORAL | Status: DC
  Administered 2018-09-22 – 2018-09-25 (×7): 5 mL via ORAL
  Filled 2018-09-22 (×7): qty 5

## 2018-09-22 MED ORDER — POTASSIUM CHLORIDE 20 MEQ/15ML (10%) PO SOLN
40.0000 meq | Freq: Two times a day (BID) | ORAL | Status: DC
  Administered 2018-09-22 – 2018-09-28 (×13): 40 meq
  Filled 2018-09-22 (×13): qty 30

## 2018-09-22 MED ORDER — MAGNESIUM SULFATE 2 GM/50ML IV SOLN
2.0000 g | Freq: Once | INTRAVENOUS | Status: AC
  Administered 2018-09-22: 2 g via INTRAVENOUS
  Filled 2018-09-22: qty 50

## 2018-09-22 MED ORDER — VANCOMYCIN HCL 10 G IV SOLR
2000.0000 mg | Freq: Once | INTRAVENOUS | Status: AC
  Administered 2018-09-22: 2000 mg via INTRAVENOUS
  Filled 2018-09-22: qty 2000

## 2018-09-22 MED ORDER — PROPOFOL 1000 MG/100ML IV EMUL
0.0000 ug/kg/min | INTRAVENOUS | Status: DC
  Administered 2018-09-22: 40 ug/kg/min via INTRAVENOUS
  Administered 2018-09-22: 5 ug/kg/min via INTRAVENOUS
  Administered 2018-09-22: 40 ug/kg/min via INTRAVENOUS
  Administered 2018-09-23 (×2): 50 ug/kg/min via INTRAVENOUS
  Administered 2018-09-23: 5 ug/kg/min via INTRAVENOUS
  Administered 2018-09-23 (×3): 50 ug/kg/min via INTRAVENOUS
  Administered 2018-09-24 (×2): 25 ug/kg/min via INTRAVENOUS
  Administered 2018-09-24 – 2018-09-25 (×3): 20 ug/kg/min via INTRAVENOUS
  Administered 2018-09-25 – 2018-09-26 (×2): 25 ug/kg/min via INTRAVENOUS
  Administered 2018-09-26: 15 ug/kg/min via INTRAVENOUS
  Filled 2018-09-22 (×20): qty 100

## 2018-09-22 NOTE — Progress Notes (Addendum)
eLink Physician-Brief Progress Note Patient Name: Billy Simon DOB: 07/23/1956 MRN: [id number]   Date of Service  09/22/2018  HPI/Events of Note  Severe agitation despite max dose dexmedetomidine  eICU Interventions  Transition to PAD protocol with propofol infusion and intermittent fentanyl     Intervention Category Major Interventions: Delirium, psychosis, severe agitation - evaluation and management  Merwyn Katos 09/22/2018, 6:02 AM

## 2018-09-22 NOTE — Progress Notes (Signed)
Pharmacy Antibiotic Note  Billy Simon is a 62 y.o. male admitted on 09/14/2018 with CHB with perm pacer placed 10/17. Patient remains intubated and sedated but agitated and continues to fever. Tmax today was 101 and WBC up to 15.7. Bcx 10/18 NGTD, and 10/19 resp cultures and 10/20 BAL growing normal respiratory flora. Pharmacy has been consulted for Vancomycin and zosyn dosing for presumed HCAP/aspriation pneumonia.   Vancomycin stopped yesterday due to mild clinical improvement, but patient continues to have fevers, so vancomycin is being restarted. Renal function has been stable, so will restart vancomycin on most recent dose.  Plan: -Continue zosyn 3.375gm IV q8h EI -Vancomycin 2000 mg IV x1, then 1500 mg IV q8h -Obtain vancomycin trough at steady state -Monitor renal function, clinical improvement, length of therapy  Height: 6' (182.9 cm) Weight: 221 lb 9 oz (100.5 kg) IBW/kg (Calculated) : 77.6  Temp (24hrs), Avg:100.1 F (37.8 C), Min:99.5 F (37.5 C), Max:101 F (38.3 C)  Recent Labs  Lab 09/18/18 1229 09/19/18 0342 09/20/18 0355 09/20/18 1002 09/21/18 0418 09/22/18 0352  WBC 11.5* 12.5* 14.7*  --  13.9* 15.7*  CREATININE 1.04 1.15 1.27*  --  1.22 1.21  LATICACIDVEN 1.7  --   --   --   --   --   VANCOTROUGH  --   --   --  9*  --   --     Estimated Creatinine Clearance: 78.7 mL/min (by C-G formula based on SCr of 1.21 mg/dL).    No Known Allergies  Antimicrobials this admission: Vanc 10/19 > 10/22, restarted 10/23 (missed 3 doses) Zosyn 10/19 >  Dose adjustments this admission: Vancomycin 1250 IV q12h >> 1500 q8h  Microbiology results: 10/15 MRSA PCR: + 10/18 BCx: NGTD 10/19 resp cx: normal respiratory flora 10/19 Ucx: NGTD 10/20 BAL: normal respiratory flora  Jackson Latino, PharmD PGY1 Pharmacy Resident Phone [phone] 09/22/2018     12:42 PM

## 2018-09-22 NOTE — Progress Notes (Signed)
He is still intubated and on sedation with propofol and Precedex. Patient is having head shaking movements along with right shoulder jerks.  Not following commands, withdrawing in all 4 extremities.  No increased tone/hyperreflexia making serotonin syndrome unlikely.  ICU team restarted patient's risperidone and Wellbutrin.  Plan Consider repeating CT head as unable to obtain MRI brain.  Cures to see if he has any hypodensity in the basal ganglia due to hypoxia.  Also wean sedation as possible - will reassess tomorrow morning.

## 2018-09-22 NOTE — Progress Notes (Signed)
Nutrition Follow Up  DOCUMENTATION CODES:   Not applicable  INTERVENTION:   Given Propofol use:   Decrease Vital AF 1.2 to goal rate of 45 ml/h (1080 ml per day)    Add Prostat liquid protein 30 ml 5 times daily  TF + Propofol provides 2432 kcals, 147 gm protein, 902 ml free water daily  NUTRITION DIAGNOSIS:   Inadequate oral intake related to inability to eat as evidenced by NPO status, ongoing  GOAL:   Patient will meet greater than or equal to 90% of their needs, met  MONITOR:   TF tolerance, Vent status, Labs, Skin, Weight trends, I & O's  ASSESSMENT:   62 yo Male with h/o complete heart block required a cath and temporary femoral pacer insertion on 09/14/2018.    10/18 Cortrak placed, tip in region of Ligament of Treitz  Patient remains intubated on ventilator support MV: 15.0 L/min Temp (24hrs), Avg:99.9 F (37.7 C), Min:98.5 F (36.9 C), Max:101 F (38.3 C)  Propofol: 24.1 ml/hr >> 636 fat kcals  Vital AF 1.2 formula infusing at goal rate of 80 ml/hr via Cortrak tube. Tolerating TF well. CCM note reviewed. Pt continues with agitation/delirium.  Precedex changed to Propofol for sedation. Being diuresed.   Free water flushes at 200 ml every 4 hours added 10/21. Labs & medications reviewed. Na 148 (H). P 1.6 (L). CBG's [id number].  Diet Order:   Diet Order            Diet NPO time specified  Diet effective now             EDUCATION NEEDS:   Not appropriate for education at this time  Skin:  Skin Assessment: Reviewed RN Assessment  Last BM:  10/18   Intake/Output Summary (Last 24 hours) at 09/22/2018 1715 Last data filed at 09/22/2018 1600 Gross per 24 hour  Intake 6251.33 ml  Output 5175 ml  Net 1076.33 ml   Height:   Ht Readings from Last 1 Encounters:  09/19/18 6' (1.829 m)   Weight:   Wt Readings from Last 1 Encounters:  09/22/18 100.5 kg   Ideal Body Weight:  81 kg  BMI:  Body mass index is 30.05 kg/m. >> highly skewed    Estimated Nutritional Needs:   Kcal:  2400  Protein:  135-150 gm  Fluid:  per MD  Arthur Holms, RD, LDN Pager #: [phone] After-Hours Pager #: [phone]

## 2018-09-22 NOTE — Progress Notes (Signed)
PULMONARY / CRITICAL CARE MEDICINE   NAME:  Billy Simon, MRN:  [id number], DOB:  [birth year], LOS: 8 ADMISSION DATE:  09/14/2018, CONSULTATION DATE: 09/14/2018  REFERRING MD: Cardiology, CHIEF COMPLAINT: Complete heart block  BRIEF HISTORY:    62 year old with h/o complete heart block required a cath and temporary femoral pacer insertion on 09/14/2018.. Intubated for respiratory failure with hypoxia, hypercarbia, bloody frothy sputum.  Briefly on pressors for hypotension.  SIGNIFICANT PAST MEDICAL HISTORY   Depression and anxiety  SIGNIFICANT EVENTS:  09/14/18 Admitted with complete heart block requiring temporary pacer insertion and intubation. 10/16 Extubated. Started on precedex for agitation 10/17 Reintubated for permanent pacemaker insertion.  Kept on vent due to agitation. 10/19 Ongoing agitation, head CT and EEG are unremarkable. Started on broad abx for persistent fevers, trach seceretions  STUDIES:    2D echo- 09/14/2018 Focal basal hypertrophy, LVEF 60-65%, paradoxical septal motion consistent with right ventricular pacing. CT head 09/16/2018- mild ventricular megaly, no acute intracranial findings, right frontal sinusitis. EEG 10/90/19-mild diffuse slowing, no seizures.  CULTURES:  MRSA PCR 10/15 - positive Blood culture 10/17 -ng Blood cultures 10/19 -ng Trach aspirate 10/19 - nml flora  resp 10/20 > Urine culture 10/19-ng   ANTIBIOTICS:  Cefazolin 10/17- 10/18 Vanco 10/19 > Zosyn 10/19 >  LINES/TUBES:  09/14/2018 endotracheal tube>>10/16, retubed 10/17 >> 09/14/2018 right femoral pacing>> 10/17 Rt IJ CVL10/19 >>  CONSULTANTS:  09/14/18 pulmonary critical care  SUBJECTIVE:  No acute events. Has gurgling upper airway but cuff pressure is 30 and getting good volumes on vent.  CONSTITUTIONAL: BP 113/75   Pulse 99   Temp (!) 101 F (38.3 C) (Axillary)   Resp (!) 31   Ht 6' (1.829 m)   Wt 100.5 kg   SpO2 96%   BMI 30.05 kg/m   I/O last 3 completed  shifts: In: 8936.5 [I.V.:1260.8; NG/GT:6420; IV Piggyback:1255.6] Out: 6400 [Urine:5950; Emesis/NG output:450]   Vent Mode: PRVC FiO2 (%):  [50 %-80 %] 50 % Set Rate:  [18 bmp] 18 bmp Vt Set:  [409 mL] 620 mL PEEP:  [10 cmH20] 10 cmH20 Plateau Pressure:  [26 cmH20-29 cmH20] 29 cmH20  PHYSICAL EXAM: Gen:      Adult male, no acute distress HEENT:  EOMI, sclera anicteric, ETT Neck:     No masses; no thyromegaly Lungs:    Clear to auscultation bilaterally; normal respiratory effort CV:         Regular rate and rhythm; no murmurs Abd:      + bowel sounds; soft, non-tender; no palpable masses, no distension Ext:    No edema; adequate peripheral perfusion Skin:      Warm and dry; no rash Neuro:  Sedated, intubated.  Has rhythmic movements of the head and upper extremities when sedation is weaned.   ASSESSMENT AND PLAN    Acute respiratory failure in the setting of complete heart block complicated by hypoxia and acidosis. Now likely developed HCAP / aspiration pneumonia. - Continue full vent support. - Continue PEEP to 10. - Follow cultures, continue Vanco, Zosyn.  Severe agitation, encephalopathy -   Doubt serotonin syndrome, NMS.  CK slight high. History of anxiety, depression on psych meds x 30 yrs.  - Continue risperdal to 1mg  TID, wellbutrin. - Sedation: propofol gtt / fentanyl PRN (family against fentanyl gtt). - RASS goal: 0 to -1. - Use intermittent versed if needed. - MRI planned (can only do 5ds post PPM placement).  Complete heart block status post right femoral pacing wire placed 09/14/2018  with PPM placed 10/18. - Continue Tele monitoring.  AKI - resolved. - Follow urine output and creatinine.  Volume overload - +10.7L positive. - Increased furosemide from 40mg  q8hr to 60 q8hr.  Hypophos. - Kphos.  At risk malnutrition. - Tube feeds via Cortrak.  SUMMARY OF TODAY'S PLAN:  ARDS due to HCAP  & encephalopathy complicating course He does not have  rigidity to go with NMS, also not classic for serotonin syndrome Family with lots of questions.  Discussion for 30 mins with all questions answered.  Is 10.8 L positive.  Continue diuresis, increased from 40 q8hr to 60 q8hr.  Best Practice / Goals of Care / Disposition.    GOALS OF CARE: Code full FAMILY DISCUSSIONS: Wife & son  updated at bedside DISPOSITION ICU.  LABS  Glucose Recent Labs  Lab 09/21/18 1529 09/21/18 2202 09/21/18 2353 09/22/18 0410 09/22/18 0424 09/22/18 0807  GLUCAP 125* 145* 133* 57* 126* 122*    BMET Recent Labs  Lab 09/20/18 0355 09/21/18 0418 09/22/18 0352  NA 149* 148* 148*  K 3.9 3.9 3.5  CL 120* 120* 112*  CO2 25 22 28   BUN 25* 24* 27*  CREATININE 1.27* 1.22 1.21  GLUCOSE 140* 141* 136*    Liver Enzymes Recent Labs  Lab 09/18/18 1229 09/19/18 0342  AST 96* 81*  ALT 46* 45*  ALKPHOS 35* 36*  BILITOT 0.8 0.8  ALBUMIN 2.5* 2.3*    Electrolytes Recent Labs  Lab 09/20/18 0355 09/21/18 0418 09/22/18 0352  CALCIUM 6.7* 6.9* 7.1*  MG 2.0 2.0 1.9  PHOS 1.6* 1.6* 1.6*    CBC Recent Labs  Lab 09/20/18 0355 09/21/18 0418 09/22/18 0352  WBC 14.7* 13.9* 15.7*  HGB 11.3* 10.0* 10.3*  HCT 34.9* 32.2* 31.8*  PLT 122* 155 201    ABG Recent Labs  Lab 09/16/18 1506 09/19/18 0432 09/20/18 0411  PHART 7.420 7.418 7.370  PCO2ART 36.5 30.0* 37.5  PO2ART 131.0* 74.0* 93.0    Coag's No results for input(s): APTT, INR in the last 168 hours.  Sepsis Markers Recent Labs  Lab 09/16/18 1706 09/17/18 0255 09/18/18 0243 09/18/18 1229  LATICACIDVEN  --   --   --  1.7  PROCALCITON 0.25 0.72 0.82  --     Cardiac Enzymes Recent Labs  Lab 09/18/18 1229  TROPONINI 0.07*   CC time: 45 min.   Rutherford Guys, [place] - [facility] [place] Pager: [phone]  or [phone] 09/22/2018, 9:46 AM

## 2018-09-23 ENCOUNTER — Inpatient Hospital Stay (HOSPITAL_COMMUNITY): Payer: PRIVATE HEALTH INSURANCE

## 2018-09-23 DIAGNOSIS — G9340 Encephalopathy, unspecified: Secondary | ICD-10-CM

## 2018-09-23 LAB — PHOSPHORUS: PHOSPHORUS: 3 mg/dL (ref 2.5–4.6)

## 2018-09-23 LAB — POCT I-STAT 3, ART BLOOD GAS (G3+)
Acid-Base Excess: 8 mmol/L — ABNORMAL HIGH (ref 0.0–2.0)
BICARBONATE: 32.2 mmol/L — AB (ref 20.0–28.0)
O2 SAT: 99 %
PCO2 ART: 41.1 mmHg (ref 32.0–48.0)
Patient temperature: 97.8
TCO2: 33 mmol/L — AB (ref 22–32)
pH, Arterial: 7.5 — ABNORMAL HIGH (ref 7.350–7.450)
pO2, Arterial: 118 mmHg — ABNORMAL HIGH (ref 83.0–108.0)

## 2018-09-23 LAB — GLUCOSE, CAPILLARY
GLUCOSE-CAPILLARY: 121 mg/dL — AB (ref 70–99)
GLUCOSE-CAPILLARY: 132 mg/dL — AB (ref 70–99)
Glucose-Capillary: 133 mg/dL — ABNORMAL HIGH (ref 70–99)
Glucose-Capillary: 135 mg/dL — ABNORMAL HIGH (ref 70–99)
Glucose-Capillary: 136 mg/dL — ABNORMAL HIGH (ref 70–99)
Glucose-Capillary: 136 mg/dL — ABNORMAL HIGH (ref 70–99)

## 2018-09-23 LAB — CBC
HCT: 33.1 % — ABNORMAL LOW (ref 39.0–52.0)
HEMOGLOBIN: 10.5 g/dL — AB (ref 13.0–17.0)
MCH: 28.2 pg (ref 26.0–34.0)
MCHC: 31.7 g/dL (ref 30.0–36.0)
MCV: 89 fL (ref 80.0–100.0)
Platelets: 275 10*3/uL (ref 150–400)
RBC: 3.72 MIL/uL — AB (ref 4.22–5.81)
RDW: 15.7 % — ABNORMAL HIGH (ref 11.5–15.5)
WBC: 18.1 10*3/uL — AB (ref 4.0–10.5)
nRBC: 0.1 % (ref 0.0–0.2)

## 2018-09-23 LAB — BASIC METABOLIC PANEL
Anion gap: 9 (ref 5–15)
BUN: 28 mg/dL — ABNORMAL HIGH (ref 8–23)
CO2: 31 mmol/L (ref 22–32)
Calcium: 7.3 mg/dL — ABNORMAL LOW (ref 8.9–10.3)
Chloride: 108 mmol/L (ref 98–111)
Creatinine, Ser: 1.09 mg/dL (ref 0.61–1.24)
GFR calc Af Amer: >60 mL/min (ref >60)
Glucose, Bld: 126 mg/dL — ABNORMAL HIGH (ref 70–99)
POTASSIUM: 3.6 mmol/L (ref 3.5–5.1)
SODIUM: 148 mmol/L — AB (ref 135–145)

## 2018-09-23 LAB — CULTURE, BLOOD (ROUTINE X 2)
CULTURE: NO GROWTH
CULTURE: NO GROWTH
SPECIAL REQUESTS: ADEQUATE
SPECIAL REQUESTS: ADEQUATE

## 2018-09-23 LAB — VANCOMYCIN, TROUGH: VANCOMYCIN TR: 37 ug/mL — AB (ref 15–20)

## 2018-09-23 LAB — MAGNESIUM: MAGNESIUM: 2.2 mg/dL (ref 1.7–2.4)

## 2018-09-23 MED ORDER — RISPERIDONE 1 MG/ML PO SOLN
6.0000 mg | Freq: Every day | ORAL | Status: DC
  Administered 2018-09-24 – 2018-10-04 (×11): 6 mg
  Filled 2018-09-23 (×12): qty 6

## 2018-09-23 MED ORDER — POTASSIUM CHLORIDE 20 MEQ/15ML (10%) PO SOLN
20.0000 meq | ORAL | Status: DC
  Administered 2018-09-23: 20 meq
  Filled 2018-09-23: qty 15

## 2018-09-23 MED ORDER — BENZTROPINE MESYLATE 1 MG PO TABS
1.0000 mg | ORAL_TABLET | Freq: Every day | ORAL | Status: DC
  Administered 2018-09-23 – 2018-10-11 (×19): 1 mg via ORAL
  Filled 2018-09-23 (×20): qty 1

## 2018-09-23 MED ORDER — BENZTROPINE MESYLATE 1 MG PO TABS: 1.0000 mg | ORAL_TABLET | Freq: Every day | ORAL | Status: DC

## 2018-09-23 MED ORDER — DEXMEDETOMIDINE HCL IN NACL 400 MCG/100ML IV SOLN
0.4000 ug/kg/h | INTRAVENOUS | Status: DC
  Administered 2018-09-23: 1 ug/kg/h via INTRAVENOUS
  Administered 2018-09-23: 0.4 ug/kg/h via INTRAVENOUS
  Administered 2018-09-24: 0.6 ug/kg/h via INTRAVENOUS
  Administered 2018-09-24 – 2018-09-25 (×3): 0.7 ug/kg/h via INTRAVENOUS
  Administered 2018-09-25: 0.5 ug/kg/h via INTRAVENOUS
  Administered 2018-09-25: 0.6 ug/kg/h via INTRAVENOUS
  Administered 2018-09-25: 0.7 ug/kg/h via INTRAVENOUS
  Administered 2018-09-26 (×2): 1.2 ug/kg/h via INTRAVENOUS
  Administered 2018-09-26: 0.7 ug/kg/h via INTRAVENOUS
  Administered 2018-09-26 – 2018-09-28 (×11): 1.2 ug/kg/h via INTRAVENOUS
  Administered 2018-09-28: 1.1 ug/kg/h via INTRAVENOUS
  Administered 2018-09-28 – 2018-09-29 (×6): 1.2 ug/kg/h via INTRAVENOUS
  Administered 2018-09-29: 0.8 ug/kg/h via INTRAVENOUS
  Administered 2018-09-29 – 2018-09-30 (×4): 1.2 ug/kg/h via INTRAVENOUS
  Administered 2018-09-30 (×2): 0.8 ug/kg/h via INTRAVENOUS
  Administered 2018-09-30: 1 ug/kg/h via INTRAVENOUS
  Administered 2018-10-01: 0.4 ug/kg/h via INTRAVENOUS
  Filled 2018-09-23 (×43): qty 100

## 2018-09-23 MED ORDER — BUSPIRONE HCL 5 MG PO TABS
15.0000 mg | ORAL_TABLET | Freq: Two times a day (BID) | ORAL | Status: DC
  Administered 2018-09-23 – 2018-09-30 (×15): 15 mg via ORAL
  Filled 2018-09-23 (×15): qty 1

## 2018-09-23 MED ORDER — CITALOPRAM HYDROBROMIDE 20 MG PO TABS
40.0000 mg | ORAL_TABLET | Freq: Every day | ORAL | Status: DC
  Administered 2018-09-23 – 2018-10-11 (×19): 40 mg via ORAL
  Filled 2018-09-23 (×20): qty 2

## 2018-09-23 MED ORDER — RISPERIDONE 3 MG PO TABS
6.0000 mg | ORAL_TABLET | Freq: Every day | ORAL | Status: DC
  Administered 2018-09-23: 6 mg
  Filled 2018-09-23: qty 2

## 2018-09-23 MED ORDER — RISPERIDONE 3 MG PO TABS
6.0000 mg | ORAL_TABLET | Freq: Every day | ORAL | Status: DC
  Filled 2018-09-23: qty 2

## 2018-09-23 NOTE — Progress Notes (Signed)
Call from RN earlier about ongoing agitation and percedex not helping  Ct Head Wo Contrast  Result Date: 09/23/2018 CLINICAL DATA:  Mental status change. EXAM: CT HEAD WITHOUT CONTRAST TECHNIQUE: Contiguous axial images were obtained from the base of the skull through the vertex without intravenous contrast. COMPARISON:  09/16/2018 FINDINGS: Brain: Stable age advanced cerebral atrophy and ventriculomegaly. No extra-axial fluid collections are identified. No CT findings for acute hemispheric infarction or intracranial hemorrhage. No mass lesions. The brainstem and cerebellum are normal. Vascular: No hyperdense vessel or unexpected calcification. Skull: No skull fracture or bone lesion. Sinuses/Orbits: Right maxillary sinus disease. Mucoperiosteal thickening involving both halves of the sphenoid sinus. The right frontal sinus and ethmoid sinuses demonstrate improved aeration. Small bilateral mastoid effusions, new. Other: No scalp lesions or hematoma. IMPRESSION: 1. No acute intracranial findings. 2. Stable mild age advanced cerebral atrophy and ventriculomegaly. 3. Paranasal sinus disease and mastoid effusions. Electronically Signed   By: Rudie Meyer M.D.   On: 09/23/2018 14:37   Dg Chest Port 1 View  Result Date: 09/23/2018 CLINICAL DATA:  Respiratory failure. EXAM: PORTABLE CHEST 1 VIEW COMPARISON:  Radiograph of September 22, 2018. FINDINGS: Stable cardiomediastinal silhouette. Endotracheal and feeding tubes are unchanged in position. Right internal jugular catheter is unchanged in position. Stable central pulmonary vascular congestion is noted. Left-sided pacemaker is unchanged in position. No pneumothorax is noted. Stable left basilar atelectasis or infiltrate is noted with associated pleural effusion. Bony thorax is unremarkable. IMPRESSION: Stable support apparatus. Stable central pulmonary vascular congestion. Stable left basilar opacity as described above. Electronically Signed   By: Lupita Raider, M.D.   On: 09/23/2018 10:38   A Agitated encephalaopahty  Plan Ok to go back to dipirivan Dc precedex hjome psych meds restarted     SIGNATURE    Dr. Kalman Shan, M.D., F.C.C.P,  Pulmonary and Critical Care Medicine Staff Physician, [facility] Director - Interstitial Lung Disease  Program  Pulmonary Fibrosis [facility] at [facility] [place], [place], [address]  Pager: [phone], If no answer or between  15:00h - 7:00h: call [phone] Telephone: [phone]  5:49 PM 09/23/2018

## 2018-09-23 NOTE — Progress Notes (Addendum)
Brief History (per Dr Cheral Marker 09/19/2018) Billy Simon is an 62 y.o. male who was admitted on 10/15 for complete heart block requiring temporary pacemaker insertion and intubation. Son states that onset of symptoms was at home with back to back fainting spells between which the patient was conversant. No seizure like activity endorsed. After extubation on 10/16 he became agitated, requiring Precedx. He was reintubated on 10/17 for permanent pacemaker insertion and kept on the vent due to agitation. An EEG and head CT were obtained. CT without acute abnormality, but with mildly enlarged ventricles and right frontal sinusitis. EEG showed diffuse mild slowing without seizures. He had continued agitation and was started on broad spectrum antibiotics for persistent fevers yesterday.  He is on cefazolin, vancomycin and Zosyn.  Family denies EtOH use at home. He takes Xanax PRN anxiety; his wife controls this medication and states there is no chance that he is abusing it.  Current problem list: - Vent dependent respiratory failure in the setting of complete heart block complicated by hypoxia and acidosis - Severe agitation and encephalopathy - Mild renal insufficiency - Complete heart block, s/p permanent pacemaker placement on 10/18 The patient is on risperdal at home as well as serotonergic medications Celexa, Wellbutrin and buspar. Wife states that he has been taking these medications for many years and has not had any recent dosage changes or new additions to his regimen.    Subjective: The pt's wife and son were at the bedside. The pt. was off sedation since early AM but will be restarted secondary to tachycardia and tachypnea. Exam performed off sedation. Wife concerned pt has not had BM since admission.  Past Medical History Past Medical History:  Diagnosis Date  . CHB (complete heart block) ([place]) 09/14/2018  . Syncope 08/2018   Hospitalized at [facility]    Past Surgical History Past  Surgical History:  Procedure Laterality Date  . LEFT HEART CATH AND CORONARY ANGIOGRAPHY N/A 09/14/2018   Procedure: LEFT HEART CATH AND CORONARY ANGIOGRAPHY;  Surgeon: Martinique, Peter M, MD;  Location: [place] [facility];  Service: Cardiovascular;  Laterality: N/A;  . PACEMAKER IMPLANT N/A 09/16/2018   Procedure: PACEMAKER IMPLANT;  Surgeon: Constance Haw, MD;  Location: [place] CV LAB;  Service: Cardiovascular;  Laterality: N/A;  . TEMPORARY PACEMAKER N/A 09/14/2018   Procedure: TEMPORARY PACEMAKER;  Surgeon: Martinique, Peter M, MD;  Location: [place] [facility];  Service: Cardiovascular;  Laterality: N/A;    Allergies No Known Allergies  Home Medications Medications Prior to Admission  Medication Sig Dispense Refill  . ALPRAZolam (XANAX) 1 MG tablet Take 1 mg by mouth daily as needed for anxiety.    . benztropine (COGENTIN) 1 MG tablet Take 1 mg by mouth daily.    Marland Kitchen buPROPion (WELLBUTRIN XL) 300 MG 24 hr tablet Take 300 mg by mouth daily.    . busPIRone (BUSPAR) 15 MG tablet Take 15 mg by mouth 2 (two) times daily.    . citalopram (CELEXA) 40 MG tablet Take 40 mg by mouth daily.    . risperiDONE (RISPERDAL) 2 MG tablet Take 6 mg by mouth daily.       Hospital Medications . benztropine  1 mg Oral Daily  . buPROPion  100 mg Oral TID  . chlorhexidine  60 mL Topical Once  . chlorhexidine  60 mL Topical Once  . chlorhexidine gluconate (MEDLINE KIT)  15 mL Mouth Rinse BID  . Chlorhexidine Gluconate Cloth  6 each Topical Q0600  .  docusate  100 mg Per Tube BID  . feeding supplement (PRO-STAT SUGAR FREE 64)  30 mL Per Tube 5 X Daily  . free water  200 mL Per Tube Q4H  . furosemide  60 mg Intravenous Q8H  . heparin  5,000 Units Subcutaneous Q8H  . mouth rinse  15 mL Mouth Rinse 10 times per day  . potassium chloride  20 mEq Per Tube Q4H  . potassium chloride  40 mEq Per Tube BID  . risperiDONE  1 mg Per Tube QHS  . sennosides  5 mL Oral BID  . sodium chloride flush  10-40 mL  Intracatheter Q12H     Intake/Output from previous day: 10/23 0701 - 10/24 0700 In: 3856.1 [I.V.:537.7; NG/GT:1489; IV Piggyback:1829.3] Out: 6570 [Urine:6570] Intake/Output this shift: No intake/output data recorded.   Nutritional status:  Diet Order            Diet NPO time specified  Diet effective now               Physical Exam  Vitals:   09/23/18 0300 09/23/18 0400 09/23/18 0500 09/23/18 0600  BP: 109/68 109/70 115/70 120/73  Pulse: 94 94 93 98  Resp: (!) 23 (!) 25 (!) 26 (!) 24  Temp:  97.8 F (36.6 C)    TempSrc:  Oral    SpO2: 97% 97% 98% 98%  Weight:      Height:       General - 62 yo male intubated - awake - somewhat alert Heart - Regular rate and rhythm Lungs - Rhonchi throughout Abdomen - distended Extremities - Distal pulses weak to absent- 2+ pitting edema bilaterally Skin - Warm and dry  Neurologic Exam:  Minimal jerking movements of head noted several times. No jerking of extremities.  Mental Status:  The pt follows some commands inconsistantly. Cranial Nerves:  II- unable to assess  III/IV/VI-Pupils were equal and reacted. Extraocular movements could not be fully tested   V/VII- pt raised bot eyebrows to command VIII- mildly HOH X- intubated XII - was able to protrudre tongue slightly on cammand  Motor: Spontaneous movements RUE 3/5 Right : Upper extremity  Grip - 1/5   Left:     Upper extremity  Grip - 1/5  Lower extremity   1/5     Lower extremity   1/5 Tone and bulk:normal tone throughout; no atrophy noted Sensory: minimal reaction to pain all extremities Deep Tendon Reflexes: 2/4 throughout Plantars: Downgoing bilaterally but required extreme stimuli Cerebellar: Unable to test Gait: not tested    LABORATORY RESULTS:  Basic Metabolic Panel: Recent Labs  Lab 09/19/18 0342 09/20/18 0355 09/21/18 0418 09/22/18 0352 09/23/18 0458  NA 146* 149* 148* 148* 148*  K 3.6 3.9 3.9 3.5 3.6  CL 116* 120* 120* 112* 108  CO2 '23 25 22  28 31  '$ GLUCOSE 116* 140* 141* 136* 126*  BUN 22 25* 24* 27* 28*  CREATININE 1.15 1.27* 1.22 1.21 1.09  CALCIUM 7.2* 6.7* 6.9* 7.1* 7.3*  MG 1.9 2.0 2.0 1.9 2.2  PHOS 1.4* 1.6* 1.6* 1.6* 3.0    Liver Function Tests: Recent Labs  Lab 09/18/18 1229 09/19/18 0342  AST 96* 81*  ALT 46* 45*  ALKPHOS 35* 36*  BILITOT 0.8 0.8  PROT 5.3* 5.4*  ALBUMIN 2.5* 2.3*   No results for input(s): LIPASE, AMYLASE in the last 168 hours. No results for input(s): AMMONIA in the last 168 hours.  CBC: Recent Labs  Lab 09/18/18 1229 09/19/18  3794 09/20/18 0355 09/21/18 0418 09/22/18 0352 09/23/18 0458  WBC 11.5* 12.5* 14.7* 13.9* 15.7* 18.1*  NEUTROABS 9.8*  --   --   --   --   --   HGB 11.8* 11.2* 11.3* 10.0* 10.3* 10.5*  HCT 36.8* 35.0* 34.9* 32.2* 31.8* 33.1*  MCV 88.0 89.1 89.5 90.4 88.3 89.0  PLT 109* 95* 122* 155 201 275    Cardiac Enzymes: Recent Labs  Lab 09/18/18 1229 09/19/18 1001 09/22/18 1220  CKTOTAL  --  733* 359  CKMB  --   --  1.9  TROPONINI 0.07*  --   --     Lipid Panel: Recent Labs  Lab 09/22/18 0622  TRIG 213*    CBG: Recent Labs  Lab 09/22/18 0845 09/22/18 1207 09/22/18 1607 09/23/18 0005 09/23/18 0448  GLUCAP 110* 126* 155* 136* 136*     Miscellaneous Labs:  Lactic Acid -  2.2 (09/22/2018)    IMAGING RESULTS  Dg Chest Port 1 View 09/22/2018 IMPRESSION: Improved aeration at the upper aspect of the right lung, with persisting left-sided mixed interstitial and airspace opacity. Unchanged enteric tube, endotracheal tube, and right IJ central catheter     CT Head Wo Contrast  09/16/2018 IMPRESSION:  Mild ventriculomegaly, uncertain duration and significance.  No acute intracranial findings. Specifically no visible acute stroke or hemorrhage.  RIGHT frontal sinusitis, acute.   EEG 09/17/2018 IMPRESSION: This is an abnormal EEG secondary to general background slowing. This finding may be seen with a diffuse disturbance that is  etiologically nonspecific, but may include a metabolic encephalopathy, among other possibilities.  No epileptiform activity was noted.  Although patient with a prolonged episode of shaking there was no evidence of electrographic seizures.   Transthoracic Echocardiogram 09/14/2018 Study Conclusions  - Left ventricle: There was mild focal basal hypertrophy of the   septum. Systolic function was normal. The estimated ejection   fraction was in the range of 60% to 65%. The study is not   technically sufficient to allow evaluation of LV diastolic   function. Cannot exclude thrombus. - Ventricular septum: Septal motion showed paradox. These changes   are consistent with right ventricular pacing.   Mikey Bussing PA-C [facility] Pager [phone] 09/23/2018, 8:52 AM  ASSESSMENT AND PLAN  Abnormal choreiform movements  Altered mental status  Plan Repeat CT Head Extubated as tolerated Continue Benztropine   NEUROHOSPITALIST ADDENDUM Performed a face to face diagnostic evaluation.   I have reviewed the contents of history and physical exam as documented by PA/ARNP/Resident and agree with above documentation.  I have discussed and formulated the above plan as documented. Edits to the note have been made as needed.  Patient following commands, head shaking movements improved.  Likely movement disorder, continue home benztropine. CT Head when stable. Will examine when patient extubated if he still has these movements.     Karena Addison Javell Blackburn MD [facility] [phone]   If 7pm to 7am, please call on call as listed on AMION.

## 2018-09-23 NOTE — Progress Notes (Addendum)
PULMONARY / CRITICAL CARE MEDICINE   NAME:  Billy Simon, MRN:  [id number], DOB:  August 11, 1956, LOS: 9 ADMISSION DATE:  09/14/2018, CONSULTATION DATE: 09/14/2018  REFERRING MD: Cardiology, CHIEF COMPLAINT: Complete heart block  BRIEF HISTORY:    62 year old with h/o complete heart block required a cath and temporary femoral pacer insertion on 09/14/2018.. Intubated for respiratory failure with hypoxia, hypercarbia, bloody frothy sputum.  Briefly on pressors for hypotension.  SIGNIFICANT PAST MEDICAL HISTORY   Depression and anxiety  SIGNIFICANT EVENTS:  09/14/18 Admitted with complete heart block requiring temporary pacer insertion and intubation. 10/16 Extubated. Started on precedex for agitation 10/17 Reintubated for permanent pacemaker insertion.  Kept on vent due to agitation. 10/19 Ongoing agitation, head CT and EEG are unremarkable. Started on broad abx for persistent fevers, trach seceretions  STUDIES:    2D echo- 09/14/2018 Focal basal hypertrophy, LVEF 60-65%, paradoxical septal motion consistent with right ventricular pacing. CT head 09/16/2018- mild ventricular megaly, no acute intracranial findings, right frontal sinusitis. EEG 10/90/19-mild diffuse slowing, no seizures. CT head 10/24 >   CULTURES:  MRSA PCR 10/15 - positive Blood culture 10/17 -ng Blood cultures 10/19 -ng Trach aspirate 10/19 - nml flora  Resp 10/20 > Urine culture 10/19-ng   ANTIBIOTICS:  Cefazolin 10/17- 10/18 Vanco 10/19 > Zosyn 10/19 >  LINES/TUBES:  09/14/2018 endotracheal tube>>10/16, retubed 10/17 >> 09/14/2018 right femoral pacing>> 10/17 Rt IJ CVL10/19 >>  CONSULTANTS:  09/14/18 pulmonary critical care  SUBJECTIVE:  No acute events. Had great UOP after lasix increased from 40mg  q8hrs to 60mg  q8hrs. More awake this AM, following commands though delayed response.  CONSTITUTIONAL: BP (!) 156/88   Pulse (!) 106   Temp 97.8 F (36.6 C) (Axillary)   Resp (!) 29   Ht 6' (1.829 m)    Wt 100.5 kg   SpO2 99%   BMI 30.05 kg/m   I/O last 3 completed shifts: In: 8135.7 [I.V.:1032.9; NW/GN:5621.3; IV Piggyback:2144.6] Out: 9845 [Urine:9395; Emesis/NG output:450]   Vent Mode: PRVC FiO2 (%):  [50 %] 50 % Set Rate:  [18 bmp] 18 bmp Vt Set:  [086 mL] 620 mL PEEP:  [10 cmH20] 10 cmH20 Plateau Pressure:  [20 cmH20-29 cmH20] 29 cmH20  PHYSICAL EXAM:  Gen:      Adult male, no acute distress HEENT:  EOMI, sclera anicteric, ETT Neck:     No masses; no thyromegaly Lungs:    Coarse crackles bilaterally CV:         Regular rate and rhythm; no murmurs Abd:      + bowel sounds; soft, non-tender; no palpable masses, no distension Ext:    No edema; adequate peripheral perfusion Skin:      Warm and dry; no rash Neuro:   Sedated, intubated.  Has rhythmic movements of the head and upper extremities.  ASSESSMENT AND PLAN    Acute respiratory failure in the setting of complete heart block complicated by hypoxia and acidosis. Now likely developed HCAP / aspiration pneumonia. - Continue full vent support, weaned from 50% FiO2 to 40% and PEEP 10 to 5. - Follow cultures, continue Vanco, Zosyn.  Severe agitation, encephalopathy -   Doubt serotonin syndrome, NMS.  CK slight high. History of anxiety, depression on psych meds x 30 yrs.  - Continue risperdal to 1mg  TID, wellbutrin. - Sedation: propofol gtt / fentanyl PRN (family against fentanyl gtt).  Will attempt to switch propofol to precedex in hopes can keep pt comfortable but not oversedated. - RASS goal: 0 to -1. -  Use intermittent versed if needed. - Repeat CT head per neurology recs. - MRI planned when able (need to discuss timing after PPM).  Complete heart block status post right femoral pacing wire placed 09/14/2018 with PPM placed 10/18. - Continue Tele monitoring.  AKI - resolved.  Tolerating increased lasix dose well. Volume overload - 8L positive. - Continue furosemide 60mg  q8hr (increased 10/23 from 40mg ). - Follow  urine output and creatinine.  At risk malnutrition. - Tube feeds via Cortrak.  SUMMARY OF TODAY'S PLAN:  Continue abx, vent support.  Weaned PEEP and FiO2, suspect will continue to improve as he diuresis more. Repeat CT head per neuro recs. Try precedex instead of propofol for comfort while trying to not cause oversedation.   Best Practice / Goals of Care / Disposition.    GOALS OF CARE: Code full FAMILY DISCUSSIONS: Wife & son  updated at bedside DISPOSITION ICU.  LABS  Glucose Recent Labs  Lab 09/22/18 0807 09/22/18 0845 09/22/18 1207 09/22/18 1607 09/23/18 0005 09/23/18 0448  GLUCAP 122* 110* 126* 155* 136* 136*    BMET Recent Labs  Lab 09/21/18 0418 09/22/18 0352 09/23/18 0458  NA 148* 148* 148*  K 3.9 3.5 3.6  CL 120* 112* 108  CO2 22 28 31   BUN 24* 27* 28*  CREATININE 1.22 1.21 1.09  GLUCOSE 141* 136* 126*    Liver Enzymes Recent Labs  Lab 09/18/18 1229 09/19/18 0342  AST 96* 81*  ALT 46* 45*  ALKPHOS 35* 36*  BILITOT 0.8 0.8  ALBUMIN 2.5* 2.3*    Electrolytes Recent Labs  Lab 09/21/18 0418 09/22/18 0352 09/23/18 0458  CALCIUM 6.9* 7.1* 7.3*  MG 2.0 1.9 2.2  PHOS 1.6* 1.6* 3.0    CBC Recent Labs  Lab 09/21/18 0418 09/22/18 0352 09/23/18 0458  WBC 13.9* 15.7* 18.1*  HGB 10.0* 10.3* 10.5*  HCT 32.2* 31.8* 33.1*  PLT 155 201 275    ABG Recent Labs  Lab 09/19/18 0432 09/20/18 0411 09/23/18 0529  PHART 7.418 7.370 7.500*  PCO2ART 30.0* 37.5 41.1  PO2ART 74.0* 93.0 118.0*    Coag's No results for input(s): APTT, INR in the last 168 hours.  Sepsis Markers Recent Labs  Lab 09/17/18 0255 09/18/18 0243 09/18/18 1229 09/22/18 1220  LATICACIDVEN  --   --  1.7 2.2*  PROCALCITON 0.72 0.82  --  1.49    Cardiac Enzymes Recent Labs  Lab 09/18/18 1229  TROPONINI 0.07*   CC time: 30 min.   Rutherford Guys, [place] - [facility] [place] [facility] Pager: [phone]  or [phone] 09/23/2018, 8:26  AM

## 2018-09-23 NOTE — Progress Notes (Signed)
Patient transported to CT and back to 2H10 without any apparent complications.

## 2018-09-23 NOTE — Progress Notes (Signed)
[facility] ADULT ICU REPLACEMENT PROTOCOL FOR AM LAB REPLACEMENT ONLY  The patient does apply for the [facility] Adult ICU Electrolyte Replacment Protocol based on the criteria listed below:   1. Is GFR >/= 40 ml/min? Yes.    Patient's GFR today is >60 2. Is urine output >/= 0.5 ml/kg/hr for the last 6 hours? Yes.   Patient's UOP is 1.8 ml/kg/hr 3. Is BUN < 60 mg/dL? Yes.    Patient's BUN today is 28 4. Abnormal electrolyte(s): K-3.6 5. Ordered repletion with: per protocol 6. If a panic level lab has been reported, has the CCM MD in charge been notified? Yes.  .   Physician:  Dr. Loney Hering, Dixon Boos 09/23/2018 6:17 AM

## 2018-09-24 ENCOUNTER — Inpatient Hospital Stay (HOSPITAL_COMMUNITY): Payer: PRIVATE HEALTH INSURANCE

## 2018-09-24 LAB — PHOSPHORUS: Phosphorus: 3.3 mg/dL (ref 2.5–4.6)

## 2018-09-24 LAB — BASIC METABOLIC PANEL
Anion gap: 11 (ref 5–15)
BUN: 36 mg/dL — ABNORMAL HIGH (ref 8–23)
CALCIUM: 7.8 mg/dL — AB (ref 8.9–10.3)
CHLORIDE: 108 mmol/L (ref 98–111)
CO2: 30 mmol/L (ref 22–32)
Creatinine, Ser: 1.3 mg/dL — ABNORMAL HIGH (ref 0.61–1.24)
GFR calc Af Amer: >60 mL/min (ref >60)
GFR calc non Af Amer: 58 mL/min — ABNORMAL LOW (ref >60)
GLUCOSE: 117 mg/dL — AB (ref 70–99)
POTASSIUM: 3.5 mmol/L (ref 3.5–5.1)
Sodium: 149 mmol/L — ABNORMAL HIGH (ref 135–145)

## 2018-09-24 LAB — GLUCOSE, CAPILLARY
GLUCOSE-CAPILLARY: 112 mg/dL — AB (ref 70–99)
GLUCOSE-CAPILLARY: 117 mg/dL — AB (ref 70–99)
Glucose-Capillary: 134 mg/dL — ABNORMAL HIGH (ref 70–99)
Glucose-Capillary: 122 mg/dL — ABNORMAL HIGH (ref 70–99)
Glucose-Capillary: 130 mg/dL — ABNORMAL HIGH (ref 70–99)
Glucose-Capillary: 126 mg/dL — ABNORMAL HIGH (ref 70–99)

## 2018-09-24 LAB — POCT I-STAT 3, ART BLOOD GAS (G3+)
ACID-BASE EXCESS: 6 mmol/L — AB (ref 0.0–2.0)
Bicarbonate: 29.3 mmol/L — ABNORMAL HIGH (ref 20.0–28.0)
O2 Saturation: 97 %
Patient temperature: 99.1
TCO2: 30 mmol/L (ref 22–32)
pCO2 arterial: 37.1 mmHg (ref 32.0–48.0)
pH, Arterial: 7.507 — ABNORMAL HIGH (ref 7.350–7.450)
pO2, Arterial: 85 mmHg (ref 83.0–108.0)

## 2018-09-24 LAB — VANCOMYCIN, RANDOM: VANCOMYCIN RM: 15

## 2018-09-24 LAB — CBC
HEMATOCRIT: 34.1 % — AB (ref 39.0–52.0)
HEMOGLOBIN: 10.7 g/dL — AB (ref 13.0–17.0)
MCH: 28.3 pg (ref 26.0–34.0)
MCHC: 31.4 g/dL (ref 30.0–36.0)
MCV: 90.2 fL (ref 80.0–100.0)
Platelets: 320 10*3/uL (ref 150–400)
RBC: 3.78 MIL/uL — AB (ref 4.22–5.81)
RDW: 15.9 % — ABNORMAL HIGH (ref 11.5–15.5)
WBC: 18.5 10*3/uL — ABNORMAL HIGH (ref 4.0–10.5)
nRBC: 0.2 % (ref 0.0–0.2)

## 2018-09-24 LAB — MAGNESIUM: Magnesium: 2.2 mg/dL (ref 1.7–2.4)

## 2018-09-24 LAB — T4, FREE: Free T4: 0.62 ng/dL — ABNORMAL LOW (ref 0.82–1.77)

## 2018-09-24 MED ORDER — VANCOMYCIN HCL 10 G IV SOLR
1250.0000 mg | Freq: Two times a day (BID) | INTRAVENOUS | Status: DC
  Administered 2018-09-24 – 2018-09-25 (×2): 1250 mg via INTRAVENOUS
  Filled 2018-09-24 (×2): qty 1250

## 2018-09-24 NOTE — Progress Notes (Signed)
Pharmacy Antibiotic Note  Billy Simon is a 62 y.o. male admitted on 09/14/2018 with CHB with perm pacer placed 10/17. Patient remains intubated and sedated but agitated and continues to fever. Tmax today was 101 and WBC up to 15.7. Bcx 10/18 NGTD, and 10/19 resp cultures and 10/20 BAL growing normal respiratory flora. Pharmacy has been consulted for Vancomycin and zosyn dosing for presumed HCAP/aspriation pneumonia.   Vancomycin stopped yesterday due to mild clinical improvement, but patient continues to have fevers, so vancomycin is being restarted. Renal function has been stable, so will restart vancomycin on most recent dose.  10/25 update: vancomycin trough this am was elevated at 37, verified with RN that no vancomycin was running while lab was being drawn. Recheck level 12 hours later which resulted at 15.   Plan: Vancomycin 1229m IV q12 hours for expected trough of 17 Consider recheck this weekend if needed.    Height: 6' (182.9 cm) Weight: 209 lb 10.5 oz (95.1 kg) IBW/kg (Calculated) : 77.6  Temp (24hrs), Avg:98.4 F (36.9 C), Min:97.5 F (36.4 C), Max:99.1 F (37.3 C)  Recent Labs  Lab 09/18/18 1229  09/20/18 0355 09/20/18 1002 09/21/18 0418 09/22/18 0352 09/22/18 1220 09/23/18 0458 09/23/18 2303 09/24/18 0328 09/24/18 1324  WBC 11.5*   < > 14.7*  --  13.9* 15.7*  --  18.1*  --  18.5*  --   CREATININE 1.04   < > 1.27*  --  1.22 1.21  --  1.09  --  1.30*  --   LATICACIDVEN 1.7  --   --   --   --   --  2.2*  --   --   --   --   VANCOTROUGH  --   --   --  9*  --   --   --   --  37*  --   --   VANCORANDOM  --   --   --   --   --   --   --   --   --   --  15   < > = values in this interval not displayed.    Estimated Creatinine Clearance: 71.4 mL/min (A) (by C-G formula based on SCr of 1.3 mg/dL (H)).    No Known Allergies  FErin HearingPharmD., BCPS Clinical Pharmacist 09/24/2018 2:58 PM

## 2018-09-24 NOTE — Progress Notes (Signed)
Pharmacy Antibiotic Note  Billy Simon is a 62 y.o. male admitted on 09/14/2018 with CHB with perm pacer placed 10/17. Patient remains intubated and sedated but agitated and continues to fever. Tmax today was 101 and WBC up to 15.7. Bcx 10/18 NGTD, and 10/19 resp cultures and 10/20 BAL growing normal respiratory flora. Pharmacy has been consulted for Vancomycin and zosyn dosing for presumed HCAP/aspriation pneumonia.   Vancomycin stopped yesterday due to mild clinical improvement, but patient continues to have fevers, so vancomycin is being restarted. Renal function has been stable, so will restart vancomycin on most recent dose.  10/25 update: vancomycin trough tonight is elevated at 37, verified with RN that no vancomycin was running while lab was being drawn  Plan: -Hold vancomycin  -Re-check random vancomycin level in 12 hours  Height: 6' (182.9 cm) Weight: 221 lb 9 oz (100.5 kg) IBW/kg (Calculated) : 77.6  Temp (24hrs), Avg:98.1 F (36.7 C), Min:97.5 F (36.4 C), Max:99.1 F (37.3 C)  Recent Labs  Lab 09/18/18 1229 09/19/18 0342 09/20/18 0355 09/20/18 1002 09/21/18 0418 09/22/18 0352 09/22/18 1220 09/23/18 0458 09/23/18 2303  WBC 11.5* 12.5* 14.7*  --  13.9* 15.7*  --  18.1*  --   CREATININE 1.04 1.15 1.27*  --  1.22 1.21  --  1.09  --   LATICACIDVEN 1.7  --   --   --   --   --  2.2*  --   --   VANCOTROUGH  --   --   --  9*  --   --   --   --  37*    Estimated Creatinine Clearance: 87.4 mL/min (by C-G formula based on SCr of 1.09 mg/dL).    No Known Allergies  Narda Bonds, PharmD, BCPS Clinical Pharmacist Phone: [phone]

## 2018-09-24 NOTE — Progress Notes (Signed)
PULMONARY / CRITICAL CARE MEDICINE   NAME:  Billy Simon, MRN:  [id number], DOB:  1956-02-24, LOS: 38 ADMISSION DATE:  09/14/2018, CONSULTATION DATE: 09/14/2018  REFERRING MD: Cardiology, CHIEF COMPLAINT: Complete heart block  BRIEF HISTORY:    62 year old with h/o complete heart block required a cath and temporary femoral pacer insertion on 09/14/2018.. Intubated for respiratory failure with hypoxia, hypercarbia, bloody frothy sputum.  Briefly on pressors for hypotension.  SIGNIFICANT PAST MEDICAL HISTORY   Depression and anxiety  SIGNIFICANT EVENTS:  09/14/18 Admitted with complete heart block requiring temporary pacer insertion and intubation. 10/16 Extubated. Started on precedex for agitation 10/17 Reintubated for permanent pacemaker insertion.  Kept on vent due to agitation. 10/19 Ongoing agitation, head CT and EEG are unremarkable. Started on broad abx for persistent fevers, trach seceretions 09/19/18 - bronch BAL 0 95% polys 10/24  - sevee agitation, neuro called. Restarted home cogentin, buspar, celexa and increase risperdal to high home dose, failed pecedex, back on diprivan  STUDIES:    2D echo- 09/14/2018 Focal basal hypertrophy, LVEF 60-65%, paradoxical septal motion consistent with right ventricular pacing. CT head 09/16/2018- mild ventricular megaly, no acute intracranial findings, right frontal sinusitis. EEG 10/90/19-mild diffuse slowing, no seizures. CT head 10/24 > nil acute, mastoid effusion+, age advanced cerebral atrophy +  CULTURES:  MRSA PCR 10/15 - positive Blood culture 10/17 -ng Blood cultures 10/19 -ng Trach aspirate 10/19 - nml flora  Resp BAL 10/20 > 95% polys. NORMal flora Urine culture 10/19-ng   ANTIBIOTICS:  Cefazolin 10/17- 10/18 Vanco 10/19 > Zosyn 10/19 >  LINES/TUBES:  09/14/2018 endotracheal tube>>10/16, retubed 10/17 >> 09/14/2018 right femoral pacing>> 10/17 Rt IJ CVL10/19 >>  CONSULTANTS:  09/14/18 pulmonary critical  care   SUBJECTIVE/OVERNIGHT/INTERVAL HX    10/25 - afebrile since 09/22/18 . Creat slightly up; vanc level 37 and vanc on hold. Needing both precedex and diprivan for agitation control. Home psych meds restarted  Yesterday. This AM followed command. Wife reports improved following commnads. But failed SBT due to tachypnea. Being diuresed - 80m Lasix tid. Improved balance +7L was +10L   CONSTITUTIONAL: BP 135/78   Pulse (!) 114   Temp 98.7 F (37.1 C) (Oral)   Resp (!) 39   Ht 6' (1.829 m)   Wt 95.1 kg   SpO2 97%   BMI 28.43 kg/m   I/O last 3 completed shifts: In: 64268[I.V.:1088.5; Other:150; NG/GT:3220; IV Piggyback:2023.4] Out: 9540 [Urine:9540]   Vent Mode: PRVC FiO2 (%):  [40 %] 40 % Set Rate:  [18 bmp] 18 bmp Vt Set:  [[341mL] 620 mL PEEP:  [5 cmH20] 5 cmH20 Plateau Pressure:  [18 cDQQ22-97cmH20] 18 cmH20  General Appearance:  Looks criticall ill Head:  Normocephalic, without obvious abnormality, atraumatic Eyes:  PERRL - yes, conjunctiva/corneas - clear     Ears:  Normal external ear canals, both ears Nose:  G tube - no Throat:  ETT TUBE - yes , OG tube - yes Neck:  Supple,  No enlargement/tenderness/nodules Lungs: Clear to auscultation bilaterally, Ventilator   Synchrony - yes Heart:  S1 and S2 normal, no murmur, CVP - no.  Pressors - no Abdomen:  Soft, no masses, no organomegaly Genitalia / Rectal:  Not done Extremities:  Extremities- intact but with edema Skin:  ntact in exposed areas . Sacral area - intact Neurologic:  Sedation - precdex -> RASS - -3 . Moves all 4s - yes. CAM-ICU - positive but better per family .  LABS    PULMONARY Recent Labs  Lab 09/19/18 0432 09/20/18 0411 09/23/18 0529 09/24/18 0413  PHART 7.418 7.370 7.500* 7.507*  PCO2ART 30.0* 37.5 41.1 37.1  PO2ART 74.0* 93.0 118.0* 85.0  HCO3 19.4* 21.6 32.2* 29.3*  TCO2 20* 23 33* 30  O2SAT 95.0 97.0 99.0 97.0    CBC Recent Labs  Lab 09/22/18 0352 09/23/18 0458  09/24/18 0328  HGB 10.3* 10.5* 10.7*  HCT 31.8* 33.1* 34.1*  WBC 15.7* 18.1* 18.5*  PLT 201 275 320    COAGULATION No results for input(s): INR in the last 168 hours.  CARDIAC   Recent Labs  Lab 09/18/18 1229  TROPONINI 0.07*   No results for input(s): PROBNP in the last 168 hours.   CHEMISTRY Recent Labs  Lab 09/20/18 0355 09/21/18 0418 09/22/18 0352 09/23/18 0458 09/24/18 0328  NA 149* 148* 148* 148* 149*  K 3.9 3.9 3.5 3.6 3.5  CL 120* 120* 112* 108 108  CO2 _0 GLUCOSE 140* 141* 136* 126* 117*  BUN 25* 24* 27* 28* 36*  CREATININE 1.27* 1.22 1.21 1.09 1.30*  CALCIUM 6.7* 6.9* 7.1* 7.3* 7.8*  MG 2.0 2.0 1.9 2.2 2.2  PHOS 1.6* 1.6* 1.6* 3.0 3.3   Estimated Creatinine Clearance: 71.4 mL/min (A) (by C-G formula based on SCr of 1.3 mg/dL (H)).   LIVER Recent Labs  Lab 09/18/18 1229 09/19/18 0342  AST 96* 81*  ALT 46* 45*  ALKPHOS 35* 36*  BILITOT 0.8 0.8  PROT 5.3* 5.4*  ALBUMIN 2.5* 2.3*     INFECTIOUS Recent Labs  Lab 09/18/18 0243 09/18/18 1229 09/22/18 1220  LATICACIDVEN  --  1.7 2.2*  PROCALCITON 0.82  --  1.49     ENDOCRINE CBG (last 3)  Recent Labs    09/23/18 2353 09/24/18 0412 09/24/18 0806  GLUCAP 117* 134* 122*         IMAGING x48h  - image(s) personally visualized  -   highlighted in bold - cxr reported as worse but to me looks stable Ct Head Wo Contrast  Result Date: 09/23/2018 CLINICAL DATA:  Mental status change. EXAM: CT HEAD WITHOUT CONTRAST TECHNIQUE: Contiguous axial images were obtained from the base of the skull through the vertex without intravenous contrast. COMPARISON:  09/16/2018 FINDINGS: Brain: Stable age advanced cerebral atrophy and ventriculomegaly. No extra-axial fluid collections are identified. No CT findings for acute hemispheric infarction or intracranial hemorrhage. No mass lesions. The brainstem and cerebellum are normal. Vascular: No hyperdense vessel or unexpected calcification.  Skull: No skull fracture or bone lesion. Sinuses/Orbits: Right maxillary sinus disease. Mucoperiosteal thickening involving both halves of the sphenoid sinus. The right frontal sinus and ethmoid sinuses demonstrate improved aeration. Small bilateral mastoid effusions, new. Other: No scalp lesions or hematoma. IMPRESSION: 1. No acute intracranial findings. 2. Stable mild age advanced cerebral atrophy and ventriculomegaly. 3. Paranasal sinus disease and mastoid effusions. Electronically Signed   By: Marijo Sanes M.D.   On: 09/23/2018 14:37   Dg Chest Port 1 View  Result Date: 09/24/2018 CLINICAL DATA:  62 year old male with a history of respiratory failure. EXAM: PORTABLE CHEST 1 VIEW COMPARISON:  09/23/2018 FINDINGS: Cardiomediastinal silhouette unchanged in size and contour. Increasing interstitial and airspace opacities of the left lung. Unchanged cardiac pacing device with 2 leads in place. Enteric feeding tube projects over the mediastinum, terminating out of the field of view. Endotracheal tube terminates at the clavicular heads approximately 4.4 cm above the carina. Unchanged right IJ central  venous catheter. No pneumothorax.  Right lung remains relatively well aerated. IMPRESSION: Worsening right-sided mixed interstitial and airspace opacity, potentially combination of infection, edema, and/or atelectasis/consolidation. Unchanged endotracheal tube, enteric tube, right IJ central venous catheter. Electronically Signed   By: Corrie Mckusick D.O.   On: 09/24/2018 10:37   Dg Chest Port 1 View  Result Date: 09/23/2018 CLINICAL DATA:  Respiratory failure. EXAM: PORTABLE CHEST 1 VIEW COMPARISON:  Radiograph of September 22, 2018. FINDINGS: Stable cardiomediastinal silhouette. Endotracheal and feeding tubes are unchanged in position. Right internal jugular catheter is unchanged in position. Stable central pulmonary vascular congestion is noted. Left-sided pacemaker is unchanged in position. No pneumothorax is  noted. Stable left basilar atelectasis or infiltrate is noted with associated pleural effusion. Bony thorax is unremarkable. IMPRESSION: Stable support apparatus. Stable central pulmonary vascular congestion. Stable left basilar opacity as described above. Electronically Signed   By: Marijo Conception, M.D.   On: 09/23/2018 10:38     Recent Results (from the past 240 hour(s))  MRSA PCR Screening     Status: Abnormal   Collection Time: 09/14/18  9:18 PM  Result Value Ref Range Status   MRSA by PCR POSITIVE (A) NEGATIVE Final    Comment:        The GeneXpert MRSA Assay (FDA approved for NASAL specimens only), is one component of a comprehensive MRSA colonization surveillance program. It is not intended to diagnose MRSA infection nor to guide or monitor treatment for MRSA infections. RESULT CALLED TO, READ BACK BY AND VERIFIED WITH: Bea Graff RN [id number] 09/14/18 A BROWNING Performed at [facility], [place] [address], [place], [place] [address]   Culture, blood (Routine X 2) w Reflex to ID Panel     Status: None   Collection Time: 09/16/18  4:55 PM  Result Value Ref Range Status   Specimen Description BLOOD RIGHT HAND  Final   Special Requests   Final    BOTTLES DRAWN AEROBIC ONLY Blood Culture adequate volume   Culture   Final    NO GROWTH 5 DAYS Performed at [facility], [place] [address] [place], [place] [address]    Report Status 09/21/2018 FINAL  Final  Culture, blood (Routine X 2) w Reflex to ID Panel     Status: None   Collection Time: 09/16/18  5:20 PM  Result Value Ref Range Status   Specimen Description BLOOD RIGHT ANTECUBITAL  Final   Special Requests   Final    BOTTLES DRAWN AEROBIC AND ANAEROBIC Blood Culture adequate volume   Culture   Final    NO GROWTH 5 DAYS Performed at [facility], [place] [address] [place], [place] [address]    Report Status 09/21/2018 FINAL  Final  Culture, respiratory (non-expectorated)     Status: None   Collection Time:  09/18/18  8:13 AM  Result Value Ref Range Status   Specimen Description TRACHEAL ASPIRATE  Final   Special Requests NONE  Final   Gram Stain   Final    ABUNDANT WBC PRESENT, PREDOMINANTLY PMN MODERATE GRAM POSITIVE RODS MODERATE GRAM POSITIVE COCCI IN CLUSTERS MODERATE GRAM NEGATIVE RODS FEW GRAM NEGATIVE COCCOBACILLI FEW SQUAMOUS EPITHELIAL CELLS PRESENT    Culture   Final    ABUNDANT Consistent with normal respiratory flora. Performed at [facility], [place] [address] [place],  [address]    Report Status 09/20/2018 FINAL  Final  Urine Culture     Status: None   Collection  Time: 09/18/18  8:13 AM  Result Value Ref Range Status   Specimen Description URINE, RANDOM  Final   Special Requests NONE  Final   Culture   Final    NO GROWTH Performed at [facility], [address], [place], [place] [address]    Report Status 09/19/2018 FINAL  Final  Culture, blood (Routine X 2) w Reflex to ID Panel     Status: None   Collection Time: 09/18/18  1:45 PM  Result Value Ref Range Status   Specimen Description BLOOD SITE NOT SPECIFIED  Final   Special Requests   Final    BOTTLES DRAWN AEROBIC AND ANAEROBIC Blood Culture adequate volume   Culture   Final    NO GROWTH 5 DAYS Performed at [facility], [place] [address] [place], [place] [address]    Report Status 09/23/2018 FINAL  Final  Culture, blood (Routine X 2) w Reflex to ID Panel     Status: None   Collection Time: 09/18/18  1:45 PM  Result Value Ref Range Status   Specimen Description BLOOD SITE NOT SPECIFIED  Final   Special Requests   Final    BOTTLES DRAWN AEROBIC AND ANAEROBIC Blood Culture adequate volume   Culture   Final    NO GROWTH 5 DAYS Performed at [facility], [place] [address], [place], [place] [address]    Report Status 09/23/2018 FINAL  Final  Culture, bal-quantitative     Status: Abnormal   Collection Time: 09/19/18  4:03 PM  Result Value Ref Range Status   Specimen Description  BRONCHIAL ALVEOLAR LAVAGE  Final   Special Requests Normal  Final   Gram Stain   Final    RARE WBC PRESENT, PREDOMINANTLY PMN NO ORGANISMS SEEN    Culture (A)  Final    70,000 COLONIES/mL Consistent with normal respiratory flora. Performed at [facility], [place] [address] [place], [place] [address]    Report Status 09/22/2018 FINAL  Final  Acid Fast Smear (AFB)     Status: None   Collection Time: 09/19/18  4:03 PM  Result Value Ref Range Status   AFB Specimen Processing Concentration  Final   Acid Fast Smear Negative  Final    Comment: (NOTE) Performed At: [facility] [place], [place] [id number] Rush Farmer MD [id number]    Source (AFB) BRONCHIAL ALVEOLAR LAVAGE  Final  Fungus Culture With Stain     Status: None (Preliminary result)   Collection Time: 09/19/18  4:03 PM  Result Value Ref Range Status   Fungus Stain Final report  Final    Comment: (NOTE) Performed At: [facility] [place], [place] [id number] Rush Farmer MD [id number]    Fungus (Mycology) Culture PENDING  Incomplete   Fungal Source BRONCHIAL ALVEOLAR LAVAGE  Final  Fungus Culture Result     Status: None   Collection Time: 09/19/18  4:03 PM  Result Value Ref Range Status   Result 1 Comment  Final    Comment: (NOTE) KOH/Calcofluor preparation:  no fungus observed. Performed At: [facility] [place], [place] [id number] Rush Farmer MD [id number]      ASSESSMENT AND PLAN    Acute respiratory failure in the setting of complete heart block complicated by hypoxia and acidosis. Now likely developed HCAP / aspiration pneumonia.    - 09/24/2018 - > does not meet criteria for SBT/Extubation in setting of Acute Respiratory Failure due  to agitated encephalopathy (though improved) and resp muscle fatigue (deconditioning v volum overload v both)  PLAN  - - Continue full vent support - lasix,    Severe agitation,  encephalopathy -   Doubt serotonin syndrome, NMS.  CK slight high 09/19/18 but normal 09/22/18 History of anxiety, depression on psych meds x 30 yrs.   09/24/2018 -ongoingt  PLAN - MRI planned when able (need to discuss timing after PPM). - contninue home psych meds  - continue diprivaand and pecedex (has pacer and bp tolerating)   Complete heart block status post right femoral pacing wire placed 09/14/2018 with PPM placed 10/18. - Continue Tele monitoring.  AKI - resolved.  Tolerating increased lasix dose well. - Down to 7L positive on 09/24/2018 - Monitor BMET   At risk malnutrition. - Tube feeds via Cortrak.   SEpsis Syndrome - Pneumonia +/- Sinus Plan  - cotinue vanc and zosyn (might have told dc vanc if culture negative and creat rises)   SUMMARY OF TODAY'S PLAN:  Volume overload and decodnitioning and agitation barries to wean.   Best Practice / Goals of Care / Disposition.    GOALS OF CARE: Code full FAMILY DISCUSSIONS:  Wife updaetd in detail 09/24/2018 DISPOSITION ICU.  ATTESTATION & SIGNATURE   The patient is critically ill with multiple organ systems failure and requires high complexity decision making for assessment and support, frequent evaluation and titration of therapies, application of advanced monitoring technologies and extensive interpretation of multiple databases.   Critical Care Time devoted to patient care services described in this note is  30  Minutes. This time reflects time of care of this signee Dr Brand Males. This critical care time does not reflect procedure time, or teaching time or supervisory time of PA/NP/Med student/Med Resident etc but could involve care discussion time     Dr. Brand Males, M.D., [facility].C.P Pulmonary and Critical Care Medicine Staff Physician [facility] Pager: [phone], If no answer or between  15:00h - 7:00h: call [phone]  09/24/2018 11:35  AM

## 2018-09-25 ENCOUNTER — Inpatient Hospital Stay (HOSPITAL_COMMUNITY): Payer: PRIVATE HEALTH INSURANCE

## 2018-09-25 LAB — MAGNESIUM: MAGNESIUM: 2.3 mg/dL (ref 1.7–2.4)

## 2018-09-25 LAB — CBC WITH DIFFERENTIAL/PLATELET
Abs Immature Granulocytes: 1.04 10*3/uL — ABNORMAL HIGH (ref 0.00–0.07)
BASOS PCT: 0 %
Basophils Absolute: 0.1 10*3/uL (ref 0.0–0.1)
EOS PCT: 2 %
Eosinophils Absolute: 0.3 10*3/uL (ref 0.0–0.5)
HCT: 35.1 % — ABNORMAL LOW (ref 39.0–52.0)
Hemoglobin: 10.7 g/dL — ABNORMAL LOW (ref 13.0–17.0)
Immature Granulocytes: 5 %
Lymphocytes Relative: 7 %
Lymphs Abs: 1.4 10*3/uL (ref 0.7–4.0)
MCH: 27.8 pg (ref 26.0–34.0)
MCHC: 30.5 g/dL (ref 30.0–36.0)
MCV: 91.2 fL (ref 80.0–100.0)
MONO ABS: 0.9 10*3/uL (ref 0.1–1.0)
MONOS PCT: 4 %
Neutro Abs: 16.4 10*3/uL — ABNORMAL HIGH (ref 1.7–7.7)
Neutrophils Relative %: 82 %
Platelets: 351 10*3/uL (ref 150–400)
RBC: 3.85 MIL/uL — AB (ref 4.22–5.81)
RDW: 16 % — ABNORMAL HIGH (ref 11.5–15.5)
WBC: 20.1 10*3/uL — AB (ref 4.0–10.5)
nRBC: 0 % (ref 0.0–0.2)

## 2018-09-25 LAB — BASIC METABOLIC PANEL
Anion gap: 11 (ref 5–15)
Anion gap: 11 (ref 5–15)
BUN: 44 mg/dL — AB (ref 8–23)
BUN: 43 mg/dL — ABNORMAL HIGH (ref 8–23)
CALCIUM: 8 mg/dL — AB (ref 8.9–10.3)
CO2: 29 mmol/L (ref 22–32)
CO2: 26 mmol/L (ref 22–32)
CREATININE: 1.4 mg/dL — AB (ref 0.61–1.24)
Calcium: 8.2 mg/dL — ABNORMAL LOW (ref 8.9–10.3)
Chloride: 108 mmol/L (ref 98–111)
Chloride: 113 mmol/L — ABNORMAL HIGH (ref 98–111)
Creatinine, Ser: 1.38 mg/dL — ABNORMAL HIGH (ref 0.61–1.24)
GFR calc Af Amer: >60 mL/min (ref >60)
GFR calc Af Amer: >60 mL/min (ref >60)
GFR calc non Af Amer: 54 mL/min — ABNORMAL LOW (ref >60)
GFR, EST NON AFRICAN AMERICAN: 53 mL/min — AB (ref >60)
GLUCOSE: 136 mg/dL — AB (ref 70–99)
Glucose, Bld: 127 mg/dL — ABNORMAL HIGH (ref 70–99)
Potassium: 3.7 mmol/L (ref 3.5–5.1)
Potassium: 3.7 mmol/L (ref 3.5–5.1)
SODIUM: 148 mmol/L — AB (ref 135–145)
Sodium: 150 mmol/L — ABNORMAL HIGH (ref 135–145)

## 2018-09-25 LAB — CK TOTAL AND CKMB (NOT AT ARMC)
CK TOTAL: 746 U/L — AB (ref 49–397)
CK, MB: 2.3 ng/mL (ref 0.5–5.0)
Relative Index: 0.3 (ref 0.0–2.5)

## 2018-09-25 LAB — T3: T3 TOTAL: 82 ng/dL (ref 71–180)

## 2018-09-25 LAB — GLUCOSE, CAPILLARY
GLUCOSE-CAPILLARY: 120 mg/dL — AB (ref 70–99)
Glucose-Capillary: 120 mg/dL — ABNORMAL HIGH (ref 70–99)
Glucose-Capillary: 129 mg/dL — ABNORMAL HIGH (ref 70–99)
Glucose-Capillary: 132 mg/dL — ABNORMAL HIGH (ref 70–99)

## 2018-09-25 LAB — TRIGLYCERIDES: Triglycerides: 278 mg/dL — ABNORMAL HIGH (ref <150)

## 2018-09-25 LAB — PROCALCITONIN: PROCALCITONIN: 0.64 ng/mL

## 2018-09-25 LAB — LACTIC ACID, PLASMA: LACTIC ACID, VENOUS: 1.8 mmol/L (ref 0.5–1.9)

## 2018-09-25 LAB — PHOSPHORUS: Phosphorus: 4.4 mg/dL (ref 2.5–4.6)

## 2018-09-25 MED ORDER — SODIUM CHLORIDE 0.9 % IV BOLUS
1000.0000 mL | Freq: Once | INTRAVENOUS | Status: AC
  Administered 2018-09-25: 1000 mL via INTRAVENOUS

## 2018-09-25 MED ORDER — IBUPROFEN 100 MG/5ML PO SUSP
400.0000 mg | Freq: Four times a day (QID) | ORAL | Status: DC | PRN
  Filled 2018-09-25: qty 20

## 2018-09-25 MED ORDER — POTASSIUM CHLORIDE 20 MEQ/15ML (10%) PO SOLN
40.0000 meq | Freq: Once | ORAL | Status: AC
  Administered 2018-09-25: 40 meq
  Filled 2018-09-25: qty 30

## 2018-09-25 MED ORDER — FUROSEMIDE 10 MG/ML IJ SOLN
40.0000 mg | Freq: Two times a day (BID) | INTRAMUSCULAR | Status: DC
  Administered 2018-09-25: 40 mg via INTRAVENOUS
  Filled 2018-09-25: qty 4

## 2018-09-25 NOTE — Progress Notes (Signed)
eLink Physician-Brief Progress Note Patient Name: Billy Simon DOB: 1956/10/21 MRN: [id number]   Date of Service  09/25/2018  HPI/Events of Note  Multiple issues: 1. Fever to 101.8 F and 2. Hypotension - BP = 90/59 with MAP = 69. Patient is already on Zosyn. Vancomycin D/Ced today. AST and ALT both elevated, therefore, can't use Tylenol. Creatinine = 0.9. LVEF = 60-65%.  eICU Interventions  Will order: 1. Monitor CVP now and Q 4 hours.  2. Motrin suspension 400 mg per tube Q 6 hours PRN Temp > 101.0 F.     Intervention Category Major Interventions: Hypotension - evaluation and management;Other:  Lenell Antu 09/25/2018, 8:50 PM

## 2018-09-25 NOTE — Progress Notes (Signed)
PULMONARY / CRITICAL CARE MEDICINE   NAME:  Billy Simon, MRN:  [id number], DOB:  05/22/1956, LOS: 64 ADMISSION DATE:  09/14/2018, CONSULTATION DATE: 09/14/2018  REFERRING MD: Cardiology, CHIEF COMPLAINT: Complete heart block  BRIEF HISTORY:    62 year old with h/o complete heart block required a cath and temporary femoral pacer insertion on 09/14/2018.. Intubated for respiratory failure with hypoxia, hypercarbia, bloody frothy sputum.  Briefly on pressors for hypotension.  SIGNIFICANT PAST MEDICAL HISTORY   Depression and anxiety  SIGNIFICANT EVENTS:  09/14/18 Admitted with complete heart block requiring temporary pacer insertion and intubation. 10/16 Extubated. Started on precedex for agitation 10/17 Reintubated for permanent pacemaker insertion.  Kept on vent due to agitation. 10/19 Ongoing agitation, head CT and EEG are unremarkable. Started on broad abx for persistent fevers, trach seceretions 09/19/18 - bronch BAL 0 95% polys 10/24  - sevee agitation, neuro called. Restarted home cogentin, buspar, celexa and increase risperdal to high home dose, failed pecedex, back on diprivan 10/25 - - afebrile since 09/22/18 . Creat slightly up; vanc level 37 and vanc on hold. Needing both precedex and diprivan for agitation control. Home psych meds restarted  Yesterday. This AM followed command. Wife reports improved following commnads. But failed SBT due to tachypnea. Being diuresed - 21m Lasix tid. Improved balance +7L was +10L  STUDIES:    2D echo- 09/14/2018 Focal basal hypertrophy, LVEF 60-65%, paradoxical septal motion consistent with right ventricular pacing. CT head 09/16/2018- mild ventricular megaly, no acute intracranial findings, right frontal sinusitis. EEG 10/90/19-mild diffuse slowing, no seizures. CT head 10/24 > nil acute, mastoid effusion+, age advanced cerebral atrophy +  CULTURES:  MRSA PCR 10/15 - positive Blood culture 10/17 -ng Blood cultures 10/19 -ng Trach aspirate  10/19 - nml flora  Resp BAL 10/20 > 95% polys. NORMal flora Urine culture 10/19-ng   ANTIBIOTICS:  Cefazolin 10/17- 10/18 Vanco 10/19 >10/26 Zosyn 10/19 >  LINES/TUBES:  09/14/2018 endotracheal tube>>10/16, retubed 10/17 >> 09/14/2018 right femoral pacing>> 10/17 Rt IJ CVL10/19 >>  CONSULTANTS:  09/14/18 pulmonary critical care   SUBJECTIVE/OVERNIGHT/INTERVAL HX    10/26 - fluid balance down to +6.5L with diuresis. Aafebrfile. CReat rising again with lasix. WBC rising to 20K. Culture negative so far. RN reported agitation around 4am during sedation wean but this is not documented and son says he was at bedside and no agitation ever happened. Failed sBT within few minutes. Family (wife, son, and daughter at bedside) - seem overall frustrated by lack of progress.     CONSTITUTIONAL: BP 123/67   Pulse 73   Temp 98.3 F (36.8 C) (Oral)   Resp 18   Ht 6' (1.829 m)   Wt 93.9 kg   SpO2 97%   BMI 28.08 kg/m   I/O last 3 completed shifts: In: 3913.5 [I.V.:1323.9; NG/GT:1822.5; IV Piggyback:767.1] Out: 6950 [Urine:6950]   Vent Mode: PRVC FiO2 (%):  [40 %] 40 % Set Rate:  [18 bmp] 18 bmp Vt Set:  [[854mL] 620 mL PEEP:  [5 cmH20] 5 cmH20 Plateau Pressure:  [17 cmH20-22 cmH20] 17 cmH20    General Appearance:  Looks criticall illl Head:  Normocephalic, without obvious abnormality, atraumatic Eyes:  PERRL - yes3, conjunctiva/corneas - clear     Ears:  Normal external ear canals, both ears Nose:  G tube - no Throat:  ETT TUBE - yes , OG tube - yes Neck:  Supple,  No enlargement/tenderness/nodules Lungs: Clear to auscultation bilaterally, Ventilator   Synchrony - no, failed SBT Heart:  S1  and S2 normal, no murmur, CVP - no.  Pressors - no Abdomen:  Soft, no masses, no organomegaly Genitalia / Rectal:  Not done Extremities:  Extremities- intact Skin:  ntact in exposed areas .  Neurologic:  Sedation - low dose diprivan and prcedex gtt -> RASS - +2 an dneeding reassurance .  Moves all 4s - yes. CAM-ICU - positive for deliriuum but better than 48h ago . Orientation - recognized family and followed commans but restless         LABS    PULMONARY Recent Labs  Lab 09/19/18 0432 09/20/18 0411 09/23/18 0529 09/24/18 0413  PHART 7.418 7.370 7.500* 7.507*  PCO2ART 30.0* 37.5 41.1 37.1  PO2ART 74.0* 93.0 118.0* 85.0  HCO3 19.4* 21.6 32.2* 29.3*  TCO2 20* 23 33* 30  O2SAT 95.0 97.0 99.0 97.0    CBC Recent Labs  Lab 09/23/18 0458 09/24/18 0328 09/25/18 0436  HGB 10.5* 10.7* 10.7*  HCT 33.1* 34.1* 35.1*  WBC 18.1* 18.5* 20.1*  PLT 275 320 351    COAGULATION No results for input(s): INR in the last 168 hours.  CARDIAC   Recent Labs  Lab 09/18/18 1229  TROPONINI 0.07*   No results for input(s): PROBNP in the last 168 hours.   CHEMISTRY Recent Labs  Lab 09/21/18 0418 09/22/18 0352 09/23/18 0458 09/24/18 0328 09/25/18 0024 09/25/18 0436  NA 148* 148* 148* 149* 148*  --   K 3.9 3.5 3.6 3.5 3.7  --   CL 120* 112* 108 108 108  --   CO2 _0 --   GLUCOSE 141* 136* 126* 117* 127*  --   BUN 24* 27* 28* 36* 44*  --   CREATININE 1.22 1.21 1.09 1.30* 1.40*  --   CALCIUM 6.9* 7.1* 7.3* 7.8* 8.2*  --   MG 2.0 1.9 2.2 2.2  --  2.3  PHOS 1.6* 1.6* 3.0 3.3  --  4.4   Estimated Creatinine Clearance: 65.9 mL/min (A) (by C-G formula based on SCr of 1.4 mg/dL (H)).   LIVER Recent Labs  Lab 09/18/18 1229 09/19/18 0342  AST 96* 81*  ALT 46* 45*  ALKPHOS 35* 36*  BILITOT 0.8 0.8  PROT 5.3* 5.4*  ALBUMIN 2.5* 2.3*     INFECTIOUS Recent Labs  Lab 09/18/18 1229 09/22/18 1220 09/25/18 0024  LATICACIDVEN 1.7 2.2* 1.8  PROCALCITON  --  1.49  --      ENDOCRINE CBG (last 3)  Recent Labs    09/24/18 2000 09/25/18 0006 09/25/18 0804  GLUCAP 112* 132* 120*         IMAGING x48h  - image(s) personally visualized  -   highlighted in bold - cxr reported as worse but to me looks stable Ct Head Wo Contrast  Result  Date: 09/23/2018 CLINICAL DATA:  Mental status change. EXAM: CT HEAD WITHOUT CONTRAST TECHNIQUE: Contiguous axial images were obtained from the base of the skull through the vertex without intravenous contrast. COMPARISON:  09/16/2018 FINDINGS: Brain: Stable age advanced cerebral atrophy and ventriculomegaly. No extra-axial fluid collections are identified. No CT findings for acute hemispheric infarction or intracranial hemorrhage. No mass lesions. The brainstem and cerebellum are normal. Vascular: No hyperdense vessel or unexpected calcification. Skull: No skull fracture or bone lesion. Sinuses/Orbits: Right maxillary sinus disease. Mucoperiosteal thickening involving both halves of the sphenoid sinus. The right frontal sinus and ethmoid sinuses demonstrate improved aeration. Small bilateral mastoid effusions, new. Other: No scalp lesions or hematoma. IMPRESSION: 1. No  acute intracranial findings. 2. Stable mild age advanced cerebral atrophy and ventriculomegaly. 3. Paranasal sinus disease and mastoid effusions. Electronically Signed   By: Marijo Sanes M.D.   On: 09/23/2018 14:37   Dg Chest Port 1 View  Result Date: 09/25/2018 CLINICAL DATA:  62 year old male presented with complete heart block, respiratory failure. EXAM: PORTABLE CHEST 1 VIEW COMPARISON:  09/24/2018 and earlier. FINDINGS: Portable AP semi upright view at 0509 hours. Stable endotracheal tube tip at the level the clavicles. Enteric feeding tube courses to the abdomen, tip not included. Stable right IJ central line. Stable left chest dual lead cardiac pacemaker. Normal cardiac size and mediastinal contours. Patchy asymmetric left lung opacity persists but left basilar ventilation has mildly improved. No pneumothorax. No pleural effusion is evident. Right lung remains clear. IMPRESSION: 1.  Stable lines and tubes. 2. Persistent patchy left lung opacity with mildly improved basilar ventilation. This may reflect resolving infection or aspiration.  3. No new cardiopulmonary abnormality. Electronically Signed   By: Genevie Ann M.D.   On: 09/25/2018 07:46   Dg Chest Port 1 View  Result Date: 09/24/2018 CLINICAL DATA:  62 year old male with a history of respiratory failure. EXAM: PORTABLE CHEST 1 VIEW COMPARISON:  09/23/2018 FINDINGS: Cardiomediastinal silhouette unchanged in size and contour. Increasing interstitial and airspace opacities of the left lung. Unchanged cardiac pacing device with 2 leads in place. Enteric feeding tube projects over the mediastinum, terminating out of the field of view. Endotracheal tube terminates at the clavicular heads approximately 4.4 cm above the carina. Unchanged right IJ central venous catheter. No pneumothorax.  Right lung remains relatively well aerated. IMPRESSION: Worsening right-sided mixed interstitial and airspace opacity, potentially combination of infection, edema, and/or atelectasis/consolidation. Unchanged endotracheal tube, enteric tube, right IJ central venous catheter. Electronically Signed   By: Corrie Mckusick D.O.   On: 09/24/2018 10:37     Recent Results (from the past 240 hour(s))  Culture, blood (Routine X 2) w Reflex to ID Panel     Status: None   Collection Time: 09/16/18  4:55 PM  Result Value Ref Range Status   Specimen Description BLOOD RIGHT HAND  Final   Special Requests   Final    BOTTLES DRAWN AEROBIC ONLY Blood Culture adequate volume   Culture   Final    NO GROWTH 5 DAYS Performed at [facility], [address] [place], [place] [address]    Report Status 09/21/2018 FINAL  Final  Culture, blood (Routine X 2) w Reflex to ID Panel     Status: None   Collection Time: 09/16/18  5:20 PM  Result Value Ref Range Status   Specimen Description BLOOD RIGHT ANTECUBITAL  Final   Special Requests   Final    BOTTLES DRAWN AEROBIC AND ANAEROBIC Blood Culture adequate volume   Culture   Final    NO GROWTH 5 DAYS Performed at [facility], [place] [address], [place], [place]  [address]    Report Status 09/21/2018 FINAL  Final  Culture, respiratory (non-expectorated)     Status: None   Collection Time: 09/18/18  8:13 AM  Result Value Ref Range Status   Specimen Description TRACHEAL ASPIRATE  Final   Special Requests NONE  Final   Gram Stain   Final    ABUNDANT WBC PRESENT, PREDOMINANTLY PMN MODERATE GRAM POSITIVE RODS MODERATE GRAM POSITIVE COCCI IN CLUSTERS MODERATE GRAM NEGATIVE RODS FEW GRAM NEGATIVE COCCOBACILLI FEW SQUAMOUS EPITHELIAL CELLS PRESENT    Culture  Final    ABUNDANT Consistent with normal respiratory flora. Performed at [facility], [place] [address], [place], [place] [id number]    Report Status 09/20/2018 FINAL  Final  Urine Culture     Status: None   Collection Time: 09/18/18  8:13 AM  Result Value Ref Range Status   Specimen Description URINE, RANDOM  Final   Special Requests NONE  Final   Culture   Final    NO GROWTH Performed at [facility], [place] [address] [place], [place] [address]    Report Status 09/19/2018 FINAL  Final  Culture, blood (Routine X 2) w Reflex to ID Panel     Status: None   Collection Time: 09/18/18  1:45 PM  Result Value Ref Range Status   Specimen Description BLOOD SITE NOT SPECIFIED  Final   Special Requests   Final    BOTTLES DRAWN AEROBIC AND ANAEROBIC Blood Culture adequate volume   Culture   Final    NO GROWTH 5 DAYS Performed at [facility], [place] [address] [place], [place] [address]    Report Status 09/23/2018 FINAL  Final  Culture, blood (Routine X 2) w Reflex to ID Panel     Status: None   Collection Time: 09/18/18  1:45 PM  Result Value Ref Range Status   Specimen Description BLOOD SITE NOT SPECIFIED  Final   Special Requests   Final    BOTTLES DRAWN AEROBIC AND ANAEROBIC Blood Culture adequate volume   Culture   Final    NO GROWTH 5 DAYS Performed at [facility], [place] [address], [place], [place] [address]    Report Status 09/23/2018 FINAL  Final  Culture,  bal-quantitative     Status: Abnormal   Collection Time: 09/19/18  4:03 PM  Result Value Ref Range Status   Specimen Description BRONCHIAL ALVEOLAR LAVAGE  Final   Special Requests Normal  Final   Gram Stain   Final    RARE WBC PRESENT, PREDOMINANTLY PMN NO ORGANISMS SEEN    Culture (A)  Final    70,000 COLONIES/mL Consistent with normal respiratory flora. Performed at [facility], [place] [address] [place], [place] [address]    Report Status 09/22/2018 FINAL  Final  Acid Fast Smear (AFB)     Status: None   Collection Time: 09/19/18  4:03 PM  Result Value Ref Range Status   AFB Specimen Processing Concentration  Final   Acid Fast Smear Negative  Final    Comment: (NOTE) Performed At: [facility] [place], [place] [id number] Rush Farmer MD [id number]    Source (AFB) BRONCHIAL ALVEOLAR LAVAGE  Final  Fungus Culture With Stain     Status: None (Preliminary result)   Collection Time: 09/19/18  4:03 PM  Result Value Ref Range Status   Fungus Stain Final report  Final    Comment: (NOTE) Performed At: [facility] [place], [place] [id number] Rush Farmer MD [id number]    Fungus (Mycology) Culture PENDING  Incomplete   Fungal Source BRONCHIAL ALVEOLAR LAVAGE  Final  Fungus Culture Result     Status: None   Collection Time: 09/19/18  4:03 PM  Result Value Ref Range Status   Result 1 Comment  Final    Comment: (NOTE) KOH/Calcofluor preparation:  no fungus observed. Performed At: [facility] [address] [place], [place] [id number] Rush Farmer MD [id number]      [place]  Acute respiratory failure in the setting of complete heart block complicated by hypoxia and acidosis. Now likely developed HCAP / aspiration pneumonia.    - 09/25/2018 - > failed SBT  PLAN  - PRVC - Reduce lasix (aKI is coming back) -heading towards trach   Severe agitation, encephalopathy -   Doubt  serotonin syndrome, NMS.  CK slight high 09/19/18 but normal 09/22/18 History of anxiety, depression on psych meds x 30 yrs.   09/25/2018 - needing diprivan gtt and precedex gtt + home pysch meds for agitation controle  PLAN - MRI planned when able (need to discuss timing after PPM). - contninue home psych meds  - continue diprivaand and pecedex (has pacer and bp tolerating)   Complete heart block status post right femoral pacing wire placed 09/14/2018 with PPM placed 10/18. - being paced - Continue Tele monitoring.  AKI - resolved 09/18/18  Then on lasix for volume overload - Down to 6.5L positive on 09/25/2018 but AKI appears to have recurred - reduce lasix from 60 tid to 40 bid - Monitor BMET   At risk malnutrition. - Tube feeds via Cortrak.   SEpsis Syndrome - Pneumonia +/- Sinus On abx since 09/18/18 On 09/25/18 - wbc rising steadily to 20K but afebrile since 09/22/18  Plan  -DC vanc (high levels 10/25 and creat rising) - Check PCT and if ok dc zosyn (son asked questions about abx complications + on since 09/18/18)   SUMMARY OF TODAY'S PLAN:  10/26- supporitve ICU care. Check PCT to decide on abx/wbc   Best Practice / Goals of Care / Disposition.    GOALS OF CARE: Code full FAMILY DISCUSSIONS:   Wife, son and daughter updated in detail at bedside. Explained about LTAC/.trach, chronic critical illness, barriers to wean. They seem distressed about lack of fast recovery in patient. Explained recovery could take weeks DISPOSITION ICU.   ATTESTATION & SIGNATURE   The patient Billy Simon is critically ill with multiple organ systems failure and requires high complexity decision making for assessment and support, frequent evaluation and titration of therapies, application of advanced monitoring technologies and extensive interpretation of multiple databases.   Critical Care Time devoted to patient care services described in this note is  30  Minutes. This time reflects  time of care of this signee Dr Brand Males. This critical care time does not reflect procedure time, or teaching time or supervisory time of PA/NP/Med student/Med Resident etc but could involve care discussion time     Dr. Brand Males, M.D., [facility].C.P Pulmonary and Critical Care Medicine Staff Physician [place] [facility] Pager: [phone], If no answer or between  15:00h - 7:00h: call [phone]  09/25/2018 10:05 AM

## 2018-09-25 NOTE — Progress Notes (Signed)
eLink Physician-Brief Progress Note Patient Name: Billy Simon DOB: [birth year] MRN: [id number]   Date of Service  09/25/2018  HPI/Events of Note  Hypotension - CVP = 4. Presently on Lasix IV.   eICU Interventions  Will order: 1. Bolus with 0.9 NaCl 1 liter IV over 1 hour now.  2. Hold Lasix until patient re-evaluated by rounding team in AM.      Intervention Category Major Interventions: Hypotension - evaluation and management  Sommer,Steven Eugene 09/25/2018, 9:11 PM

## 2018-09-26 ENCOUNTER — Inpatient Hospital Stay (HOSPITAL_COMMUNITY): Payer: PRIVATE HEALTH INSURANCE

## 2018-09-26 LAB — BASIC METABOLIC PANEL
Anion gap: 10 (ref 5–15)
BUN: 42 mg/dL — AB (ref 8–23)
CALCIUM: 8.4 mg/dL — AB (ref 8.9–10.3)
CO2: 25 mmol/L (ref 22–32)
Chloride: 117 mmol/L — ABNORMAL HIGH (ref 98–111)
Creatinine, Ser: 1.35 mg/dL — ABNORMAL HIGH (ref 0.61–1.24)
GFR calc Af Amer: >60 mL/min (ref >60)
GFR, EST NON AFRICAN AMERICAN: 55 mL/min — AB (ref >60)
GLUCOSE: 130 mg/dL — AB (ref 70–99)
Potassium: 3.8 mmol/L (ref 3.5–5.1)
Sodium: 152 mmol/L — ABNORMAL HIGH (ref 135–145)

## 2018-09-26 LAB — GLUCOSE, CAPILLARY
GLUCOSE-CAPILLARY: 120 mg/dL — AB (ref 70–99)
GLUCOSE-CAPILLARY: 124 mg/dL — AB (ref 70–99)
GLUCOSE-CAPILLARY: 130 mg/dL — AB (ref 70–99)
Glucose-Capillary: 125 mg/dL — ABNORMAL HIGH (ref 70–99)
Glucose-Capillary: 142 mg/dL — ABNORMAL HIGH (ref 70–99)
Glucose-Capillary: 135 mg/dL — ABNORMAL HIGH (ref 70–99)
Glucose-Capillary: 155 mg/dL — ABNORMAL HIGH (ref 70–99)
Glucose-Capillary: 107 mg/dL — ABNORMAL HIGH (ref 70–99)

## 2018-09-26 LAB — CBC WITH DIFFERENTIAL/PLATELET
Abs Immature Granulocytes: 0.81 10*3/uL — ABNORMAL HIGH (ref 0.00–0.07)
BASOS ABS: 0.1 10*3/uL (ref 0.0–0.1)
Basophils Relative: 0 %
EOS ABS: 0.3 10*3/uL (ref 0.0–0.5)
Eosinophils Relative: 2 %
HEMATOCRIT: 35 % — AB (ref 39.0–52.0)
HEMOGLOBIN: 11 g/dL — AB (ref 13.0–17.0)
Immature Granulocytes: 4 %
LYMPHS ABS: 1.4 10*3/uL (ref 0.7–4.0)
LYMPHS PCT: 7 %
MCH: 28.8 pg (ref 26.0–34.0)
MCHC: 31.4 g/dL (ref 30.0–36.0)
MCV: 91.6 fL (ref 80.0–100.0)
MONOS PCT: 5 %
Monocytes Absolute: 1.1 10*3/uL — ABNORMAL HIGH (ref 0.1–1.0)
Neutro Abs: 17.8 10*3/uL — ABNORMAL HIGH (ref 1.7–7.7)
Neutrophils Relative %: 82 %
Platelets: 413 10*3/uL — ABNORMAL HIGH (ref 150–400)
RBC: 3.82 MIL/uL — ABNORMAL LOW (ref 4.22–5.81)
RDW: 16.3 % — AB (ref 11.5–15.5)
WBC: 21.5 10*3/uL — ABNORMAL HIGH (ref 4.0–10.5)
nRBC: 0 % (ref 0.0–0.2)

## 2018-09-26 LAB — HEPATIC FUNCTION PANEL
ALT: 163 U/L — ABNORMAL HIGH (ref 0–44)
AST: 163 U/L — AB (ref 15–41)
Albumin: 2.2 g/dL — ABNORMAL LOW (ref 3.5–5.0)
Alkaline Phosphatase: 55 U/L (ref 38–126)
BILIRUBIN INDIRECT: 0.3 mg/dL (ref 0.3–0.9)
BILIRUBIN TOTAL: 0.6 mg/dL (ref 0.3–1.2)
Bilirubin, Direct: 0.3 mg/dL — ABNORMAL HIGH (ref 0.0–0.2)
Total Protein: 6.3 g/dL — ABNORMAL LOW (ref 6.5–8.1)

## 2018-09-26 LAB — MAGNESIUM: Magnesium: 2.5 mg/dL — ABNORMAL HIGH (ref 1.7–2.4)

## 2018-09-26 LAB — PHOSPHORUS: PHOSPHORUS: 3.5 mg/dL (ref 2.5–4.6)

## 2018-09-26 LAB — LACTIC ACID, PLASMA
LACTIC ACID, VENOUS: 1.2 mmol/L (ref 0.5–1.9)
Lactic Acid, Venous: 1.4 mmol/L (ref 0.5–1.9)

## 2018-09-26 LAB — PROCALCITONIN: PROCALCITONIN: 0.55 ng/mL

## 2018-09-26 LAB — PROTIME-INR
INR: 1.2
PROTHROMBIN TIME: 15.1 s (ref 11.4–15.2)

## 2018-09-26 MED ORDER — MIDAZOLAM HCL 2 MG/2ML IJ SOLN
1.0000 mg | INTRAMUSCULAR | Status: DC | PRN
  Administered 2018-09-26 – 2018-09-27 (×3): 4 mg via INTRAVENOUS
  Administered 2018-09-27: 2 mg via INTRAVENOUS
  Administered 2018-09-27: 4 mg via INTRAVENOUS
  Administered 2018-09-27 (×2): 2 mg via INTRAVENOUS
  Administered 2018-09-27 (×2): 4 mg via INTRAVENOUS
  Administered 2018-09-28: 2 mg via INTRAVENOUS
  Administered 2018-09-28 – 2018-09-29 (×3): 4 mg via INTRAVENOUS
  Filled 2018-09-26 (×2): qty 2
  Filled 2018-09-26 (×4): qty 4
  Filled 2018-09-26: qty 2
  Filled 2018-09-26 (×5): qty 4
  Filled 2018-09-26: qty 2

## 2018-09-26 MED ORDER — FENTANYL CITRATE (PF) 100 MCG/2ML IJ SOLN
50.0000 ug | INTRAMUSCULAR | Status: DC | PRN
  Administered 2018-09-26 (×3): 200 ug via INTRAVENOUS
  Administered 2018-09-26: 100 ug via INTRAVENOUS
  Administered 2018-09-27: 200 ug via INTRAVENOUS
  Administered 2018-09-27 – 2018-09-28 (×7): 100 ug via INTRAVENOUS
  Filled 2018-09-26: qty 4
  Filled 2018-09-26 (×3): qty 2
  Filled 2018-09-26: qty 4
  Filled 2018-09-26 (×2): qty 2
  Filled 2018-09-26: qty 4
  Filled 2018-09-26 (×2): qty 2
  Filled 2018-09-26: qty 4
  Filled 2018-09-26: qty 2

## 2018-09-26 MED ORDER — SODIUM CHLORIDE 0.9 % IV SOLN
2.0000 g | Freq: Three times a day (TID) | INTRAVENOUS | Status: AC
  Administered 2018-09-26 – 2018-10-02 (×20): 2 g via INTRAVENOUS
  Filled 2018-09-26 (×20): qty 2

## 2018-09-26 NOTE — Progress Notes (Signed)
PULMONARY / CRITICAL CARE MEDICINE   NAME:  Billy Simon, MRN:  [id number], DOB:  11-Dec-1955, LOS: 12 ADMISSION DATE:  09/14/2018, CONSULTATION DATE: 09/14/2018  REFERRING MD: Cardiology, CHIEF COMPLAINT: Complete heart block  BRIEF HISTORY:    62 year old with h/o complete heart block required a cath and temporary femoral pacer insertion on 09/14/2018.. Intubated for respiratory failure with hypoxia, hypercarbia, bloody frothy sputum.  Briefly on pressors for hypotension.  SIGNIFICANT PAST MEDICAL HISTORY   Depression and anxiety  SIGNIFICANT EVENTS:  09/14/18 Admitted with complete heart block requiring temporary pacer insertion and intubation. 10/16 Extubated. Started on precedex for agitation 10/17 Reintubated for permanent pacemaker insertion.  Kept on vent due to agitation. 10/19 Ongoing agitation, head CT and EEG are unremarkable. Started on broad abx for persistent fevers, trach seceretions 09/19/18 - bronch BAL 0 95% polys 10/24  - sevee agitation, neuro called. Restarted home cogentin, buspar, celexa and increase risperdal to high home dose, failed pecedex, back on diprivan 10/25 - - afebrile since 09/22/18 . Creat slightly up; vanc level 37 and vanc on hold. Needing both precedex and diprivan for agitation control. Home psych meds restarted  Yesterday. This AM followed command. Wife reports improved following commnads. But failed SBT due to tachypnea. Being diuresed - 58m Lasix tid. Improved balance +7L was +10L  10/26 - fluid balance down to +6.5L with diuresis. Aafebrfile. CReat rising again with lasix. WBC rising to 20K. Culture negative so far. RN reported agitation around 4am during sedation wean but this is not documented and son says he was at bedside and no agitation ever happened. Failed sBT within few minutes. Family (wife, son, and daughter at bedside) - seem overall frustrated by lack of progress.     STUDIES:    2D echo- 09/14/2018 Focal basal hypertrophy, LVEF  60-65%, paradoxical septal motion consistent with right ventricular pacing. CT head 09/16/2018- mild ventricular megaly, no acute intracranial findings, right frontal sinusitis. EEG 10/90/19-mild diffuse slowing, no seizures. CT head 10/24 > nil acute, mastoid effusion+, age advanced cerebral atrophy +  CULTURES:  HIV 10/15 - neg MRSA PCR 10/15 - positive Blood culture 10/17 -ng Blood cultures 10/19 -ng Trach aspirate 10/19 - nml flora  Resp BAL 10/20 > 95% polys. NORMal flora Urine culture 10/19-ng ..............Marland Kitchen      ANTIBIOTICS:  Cefazolin 10/17- 10/18 Vanco 10/19 >10/26 Zosyn 10/19 >    LINES/TUBES:  09/14/2018 endotracheal tube>>10/16, retubed 10/17 >> 09/14/2018 right femoral pacing>> 10/17 Rt IJ CVL10/19 >>  CONSULTANTS:  09/14/18 pulmonary critical care   SUBJECTIVE/OVERNIGHT/INTERVAL HX   09/26/18 - lasix reduced and now on hold following hypotension. +6.3L . Creat better after lasix hold and fluid bolus ,  WUA: per RN -> off precedex/diprivan -> 20 min and following commands and then got tachypneic. HAs had days of rising WBC. Now febrile Tmax 101.72F but PCT down to 0.5 and lactate normal. , CXR  Unchanged. Had diarrhea but on laxatives. Per nursing notes - pacer site clean.   CK up 746 (was 359 on 09/22/18 and 733 on 09/19/18)  On dipriva. - family does not want fent gtt. Ok with fent prn    CONSTITUTIONAL: BP 122/70   Pulse 77   Temp 99.6 F (37.6 C) (Axillary)   Resp (!) 28   Ht 6' (1.829 m)   Wt 92.3 kg   SpO2 97%   BMI 27.60 kg/m   I/O last 3 completed shifts: In: 4496.6 [I.V.:1170.3; NG/GT:1990; IV Piggyback:1336.3] Out: 47425[Urine:4725] CVP:  [  2 mmHg-4 mmHg] 2 mmHg Vent Mode: CPAP;PSV FiO2 (%):  [40 %] 40 % Set Rate:  [18 bmp] 18 bmp Vt Set:  [825 mL] 620 mL PEEP:  [5 cmH20] 5 cmH20 Pressure Support:  [18 cmH20] 18 cmH20 Plateau Pressure:  [17 cmH20-22 cmH20] 20 cmH20   General Appearance:  Looks criticall ill and very  deconditoned Head:  Normocephalic, without obvious abnormality, atraumatic Eyes:  PERRL - yes, conjunctiva/corneas - clear     Ears:  Normal external ear canals, both ears Nose:  G tube - no Throat:  ETT TUBE - yes , OG tube - yes Neck:  Supple,  No enlargement/tenderness/nodules. R IJ site clean Lungs: Clear to auscultation bilaterally, Ventilator   Synchrony - no failing SBT, tachypneic RR 30 Heart:  S1 and S2 normal, no murmur, CVP - x.  Pressors - no. Left pacer site - clean Abdomen:  Soft, no masses, no organomegaly Genitalia / Rectal:  Not done Extremities:  Extremities- intact but has edema though improved Skin:  ntact in exposed areas . Sacral area - x Neurologic:  Sedation - prcedex gtt -> RASS - +1 . Moves all 4s - yes. CAM-ICU - positive for delirum though follows some commands for family . Orientation - per family he is aware. Unable to assess by MD         ASSESSMENT AND PLAN    #Acute respiratory failure in the setting of complete heart block complicated by hypoxia and acidosis. Now likely developed HCAP / aspiration pneumonia.    - 09/26/2018 - >failing SBT due to chronic critical illness, fever, encephalopathy  PLAN - PRVC - vAP bundle - likely needs trach    #SEvere schizoaffective disorder - multiple drugs # Current - Severe agitation, encephalopathy -   Doubt serotonin syndrome, NMS.  CK slight high 09/19/18 but normal 09/22/18 History of anxiety, depression on psych meds x 30 yrs. Home pysch meds (many)  09/26/2018 - delirium improved somewhat. High CK again with diprivan and home psych meds.   PLAN - MRI planned when able (need to discuss timing after PPM). - contninue home psych meds  -dc diprivan (track CK) - continue pecedex gtt (has pacer and bp tolerating)   #Complete heart block status post right femoral pacing wire placed 09/14/2018 with PPM placed 10/18.   - 09/26/18 - EKG a paced Plan - being paced - Continue Tele monitoring.  #AKI  - resolved 09/18/18  Then on lasix for volume overload - Down to 6.3L positive on 09/26/2018 but has hit limits with AKI and hypotension. AKI better after fluids overnight - DC lasix  #At risk malnutrition. - Tube feeds via Cortrak.  #Diarrhea - new 09/26/18 - stop laxatives - check C diff given family high concern about antibiotic complications   #Sepsis Syndrome - Pneumonia +/- Sinus - On abx since 09/18/18. Vanc stopped 09/25/18 - On 09/26/18  -rising wbc for days + new fever overnight  Plan  -culture blood, urine, trach aspirate - c diff stool - recheck LFT - start cefepime if C diff negative     SUMMARY OF TODAY'S PLAN:  10/26- supporitve ICU care. Check PCT to decide on abx/wbc   Best Practice / Goals of Care / Disposition.    GOALS OF CARE: Code full FAMILY DISCUSSIONS:    10/26- Wife, son and daughter updated in detail at bedside. Explained about LTAC/.trach, chronic critical illness, barriers to wean. They seem distressed about lack of fast recovery in patient. Explained recovery could  take weeks  10/27  - updaed wife, sister, son and daughter at bedside. Explained all of above including likely need for LTAC/Trach   DISPOSITION ICU.    ATTESTATION & SIGNATURE   The patient Billy Simon is critically ill with multiple organ systems failure and requires high complexity decision making for assessment and support, frequent evaluation and titration of therapies, application of advanced monitoring technologies and extensive interpretation of multiple databases.   Critical Care Time devoted to patient care services described in this note is  45  Minutes. This time reflects time of care of this signee Dr Brand Males. This critical care time does not reflect procedure time, or teaching time or supervisory time of PA/NP/Med student/Med Resident etc but could involve care discussion time     Dr. Brand Males, M.D., [facility].C.P Pulmonary and Critical Care  Medicine Staff Physician [place] [facility] Pager: [phone], If no answer or between  15:00h - 7:00h: call [phone]  09/26/2018 10:08 AM   LABS    PULMONARY Recent Labs  Lab 09/20/18 0411 09/23/18 0529 09/24/18 0413  PHART 7.370 7.500* 7.507*  PCO2ART 37.5 41.1 37.1  PO2ART 93.0 118.0* 85.0  HCO3 21.6 32.2* 29.3*  TCO2 23 33* 30  O2SAT 97.0 99.0 97.0    CBC Recent Labs  Lab 09/24/18 0328 09/25/18 0436 09/26/18 0500  HGB 10.7* 10.7* 11.0*  HCT 34.1* 35.1* 35.0*  WBC 18.5* 20.1* 21.5*  PLT 320 351 413*    COAGULATION Recent Labs  Lab 09/26/18 0500  INR 1.20    CARDIAC  No results for input(s): TROPONINI in the last 168 hours. No results for input(s): PROBNP in the last 168 hours.   CHEMISTRY Recent Labs  Lab 09/22/18 0352 09/23/18 0458 09/24/18 0328 09/25/18 0024 09/25/18 0436 09/25/18 1800 09/26/18 0500  NA 148* 148* 149* 148*  --  150* 152*  K 3.5 3.6 3.5 3.7  --  3.7 3.8  CL 112* 108 108 108  --  113* 117*  CO2 _0 --  26 25  GLUCOSE 136* 126* 117* 127*  --  136* 130*  BUN 27* 28* 36* 44*  --  43* 42*  CREATININE 1.21 1.09 1.30* 1.40*  --  1.38* 1.35*  CALCIUM 7.1* 7.3* 7.8* 8.2*  --  8.0* 8.4*  MG 1.9 2.2 2.2  --  2.3  --  2.5*  PHOS 1.6* 3.0 3.3  --  4.4  --  3.5   Estimated Creatinine Clearance: 63.1 mL/min (A) (by C-G formula based on SCr of 1.35 mg/dL (H)).   LIVER Recent Labs  Lab 09/26/18 0500  INR 1.20   Recent Labs  Lab 09/26/18 0500  INR 1.20     INFECTIOUS Recent Labs  Lab 09/22/18 1220 09/25/18 0024 09/25/18 1054 09/26/18 0500  LATICACIDVEN 2.2* 1.8  --  1.4  PROCALCITON 1.49  --  0.64 0.55     ENDOCRINE CBG (last 3)  Recent Labs    09/26/18 0018 09/26/18 0410 09/26/18 0737  GLUCAP 130* 135* 155*         IMAGING x48h  - image(s) personally visualized  -   highlighted in bold Dg Chest Port 1 View  Result Date: 09/26/2018 CLINICAL  DATA:  Intubated patient.  Follow-up exam. EXAM: PORTABLE CHEST 1 VIEW COMPARISON:  09/25/2018 and multiple prior exams. FINDINGS: Endotracheal tube, enteric feeding tube, right internal jugular central venous line and left anterior chest  wall sequential pacemaker are stable in well positioned. There is persistent hazy opacity in the left mid to lower lung consistent with a combination of layering pleural fluid with associated atelectasis or infection/pneumonitis. Right lung is clear. No pneumothorax. IMPRESSION: 1. No significant change from the most recent prior exam. 2. Stable support apparatus. 3. No change in the left mid to lower lung opacity likely combination of pleural fluid and atelectasis or infection/inflammation. Electronically Signed   By: Lajean Manes M.D.   On: 09/26/2018 10:05   Dg Chest Port 1 View  Result Date: 09/25/2018 CLINICAL DATA:  62 year old male presented with complete heart block, respiratory failure. EXAM: PORTABLE CHEST 1 VIEW COMPARISON:  09/24/2018 and earlier. FINDINGS: Portable AP semi upright view at 0509 hours. Stable endotracheal tube tip at the level the clavicles. Enteric feeding tube courses to the abdomen, tip not included. Stable right IJ central line. Stable left chest dual lead cardiac pacemaker. Normal cardiac size and mediastinal contours. Patchy asymmetric left lung opacity persists but left basilar ventilation has mildly improved. No pneumothorax. No pleural effusion is evident. Right lung remains clear. IMPRESSION: 1.  Stable lines and tubes. 2. Persistent patchy left lung opacity with mildly improved basilar ventilation. This may reflect resolving infection or aspiration. 3. No new cardiopulmonary abnormality. Electronically Signed   By: Genevie Ann M.D.   On: 09/25/2018 07:46

## 2018-09-26 NOTE — Progress Notes (Signed)
Patient has a small cuff leak, Dr. Marchelle Gearing was made aware of this and will assess the patient for tube change.

## 2018-09-26 NOTE — Progress Notes (Signed)
Pharmacy Antibiotic Note  Billy Simon is a 62 y.o. male admitted on 09/14/2018 with CHB with perm pacer placed 10/17. Patient remains intubated and sedated but agitated and continues to fever. Tmax today was 101 and WBC up to 15.7. Bcx 10/18 NGTD, and 10/19 resp cultures and 10/20 BAL growing normal respiratory flora. Pharmacy has been consulted to change zosyn to cefepime.   Tmax 101.8, wbc 21.5, scr 1.3.   Plan: Cefepime 2g q8 Follow up cx data   Height: 6' (182.9 cm) Weight: 203 lb 7.8 oz (92.3 kg) IBW/kg (Calculated) : 77.6  Temp (24hrs), Avg:100 F (37.8 C), Min:98.8 F (37.1 C), Max:101.8 F (38.8 C)  Recent Labs  Lab 09/20/18 1002  09/22/18 0352 09/22/18 1220 09/23/18 0458 09/23/18 2303 09/24/18 0328 09/24/18 1324 09/25/18 0024 09/25/18 0436 09/25/18 1800 09/26/18 0500  WBC  --    < > 15.7*  --  18.1*  --  18.5*  --   --  20.1*  --  21.5*  CREATININE  --    < > 1.21  --  1.09  --  1.30*  --  1.40*  --  1.38* 1.35*  LATICACIDVEN  --   --   --  2.2*  --   --   --   --  1.8  --   --  1.4  VANCOTROUGH 9*  --   --   --   --  37*  --   --   --   --   --   --   VANCORANDOM  --   --   --   --   --   --   --  15  --   --   --   --    < > = values in this interval not displayed.    Estimated Creatinine Clearance: 63.1 mL/min (A) (by C-G formula based on SCr of 1.35 mg/dL (H)).    No Known Allergies  Erin Hearing PharmD., BCPS Clinical Pharmacist 09/26/2018 10:50 AM

## 2018-09-26 NOTE — Progress Notes (Addendum)
Interim note   Seen by me on 10/24.  Patient was consulted for abnormal choreiform movements and persistent encephalopathy.     Last time we saw the patient he was following commands, would have jerking of the shoulder and head movements.  EEG was obtained which captured these movements was negative for seizures.  At one point, the neuroleptic malignant syndrome versus serotonin syndrome is considered, however patient on my examination was not hyperreflexic or or had increased tone. MRI was never performed as patient has recently placed pacemaker, will need to wait 6-weeks.    Since then, his psych medications have been restarted.  His abnormal movements of also improved-however he continues to be on ventilator for other reasons, likely from volume overload.  ICU working on this.    We had planned on reassessing the patient when patient was extubated- Still not extubated at the end of the week, however appears to be moving in the right direction.  Will be signing off.  PLEASE RECONSULT neurology if patient still altered, has abnormal choreiform movements following extubation.   PLEASE call back if you have any questions.     Georgiana Spinner Dannae Kato MD [facility] [phone]   If 7pm to 7am, please call on call as listed on AMION.

## 2018-09-27 ENCOUNTER — Inpatient Hospital Stay (HOSPITAL_COMMUNITY): Payer: PRIVATE HEALTH INSURANCE

## 2018-09-27 DIAGNOSIS — L8990 Pressure ulcer of unspecified site, unspecified stage: Secondary | ICD-10-CM

## 2018-09-27 LAB — GLUCOSE, CAPILLARY
GLUCOSE-CAPILLARY: 119 mg/dL — AB (ref 70–99)
GLUCOSE-CAPILLARY: 106 mg/dL — AB (ref 70–99)
Glucose-Capillary: 111 mg/dL — ABNORMAL HIGH (ref 70–99)
Glucose-Capillary: 114 mg/dL — ABNORMAL HIGH (ref 70–99)
Glucose-Capillary: 118 mg/dL — ABNORMAL HIGH (ref 70–99)
Glucose-Capillary: 119 mg/dL — ABNORMAL HIGH (ref 70–99)
Glucose-Capillary: 122 mg/dL — ABNORMAL HIGH (ref 70–99)

## 2018-09-27 LAB — BASIC METABOLIC PANEL
ANION GAP: 5 (ref 5–15)
ANION GAP: 8 (ref 5–15)
BUN: 38 mg/dL — AB (ref 8–23)
BUN: 35 mg/dL — ABNORMAL HIGH (ref 8–23)
CHLORIDE: 123 mmol/L — AB (ref 98–111)
CO2: 28 mmol/L (ref 22–32)
CO2: 24 mmol/L (ref 22–32)
Calcium: 7.9 mg/dL — ABNORMAL LOW (ref 8.9–10.3)
Calcium: 7.8 mg/dL — ABNORMAL LOW (ref 8.9–10.3)
Chloride: 121 mmol/L — ABNORMAL HIGH (ref 98–111)
Creatinine, Ser: 1.13 mg/dL (ref 0.61–1.24)
Creatinine, Ser: 1.09 mg/dL (ref 0.61–1.24)
GFR calc Af Amer: >60 mL/min (ref >60)
GFR calc Af Amer: >60 mL/min (ref >60)
GFR calc non Af Amer: >60 mL/min (ref >60)
Glucose, Bld: 119 mg/dL — ABNORMAL HIGH (ref 70–99)
Glucose, Bld: 141 mg/dL — ABNORMAL HIGH (ref 70–99)
POTASSIUM: 3.7 mmol/L (ref 3.5–5.1)
Potassium: 3.5 mmol/L (ref 3.5–5.1)
SODIUM: 156 mmol/L — AB (ref 135–145)
SODIUM: 153 mmol/L — AB (ref 135–145)

## 2018-09-27 LAB — CBC WITH DIFFERENTIAL/PLATELET
ABS IMMATURE GRANULOCYTES: 0.56 10*3/uL — AB (ref 0.00–0.07)
BASOS ABS: 0.1 10*3/uL (ref 0.0–0.1)
BASOS PCT: 0 %
Eosinophils Absolute: 0.3 10*3/uL (ref 0.0–0.5)
Eosinophils Relative: 2 %
HCT: 34.5 % — ABNORMAL LOW (ref 39.0–52.0)
HEMOGLOBIN: 10.4 g/dL — AB (ref 13.0–17.0)
Immature Granulocytes: 3 %
LYMPHS PCT: 8 %
Lymphs Abs: 1.6 10*3/uL (ref 0.7–4.0)
MCH: 28.9 pg (ref 26.0–34.0)
MCHC: 30.1 g/dL (ref 30.0–36.0)
MCV: 95.8 fL (ref 80.0–100.0)
Monocytes Absolute: 1.1 10*3/uL — ABNORMAL HIGH (ref 0.1–1.0)
Monocytes Relative: 6 %
NEUTROS PCT: 81 %
Neutro Abs: 15.3 10*3/uL — ABNORMAL HIGH (ref 1.7–7.7)
PLATELETS: 481 10*3/uL — AB (ref 150–400)
RBC: 3.6 MIL/uL — AB (ref 4.22–5.81)
RDW: 16.7 % — ABNORMAL HIGH (ref 11.5–15.5)
WBC: 18.8 10*3/uL — AB (ref 4.0–10.5)
nRBC: 0.1 % (ref 0.0–0.2)

## 2018-09-27 LAB — PHOSPHORUS: PHOSPHORUS: 3.2 mg/dL (ref 2.5–4.6)

## 2018-09-27 LAB — URINE CULTURE: Culture: NO GROWTH

## 2018-09-27 LAB — LACTIC ACID, PLASMA: Lactic Acid, Venous: 1 mmol/L (ref 0.5–1.9)

## 2018-09-27 LAB — CK TOTAL AND CKMB (NOT AT ARMC)
CK TOTAL: 365 U/L (ref 49–397)
CK, MB: 2.4 ng/mL (ref 0.5–5.0)
Relative Index: 0.7 (ref 0.0–2.5)

## 2018-09-27 LAB — MAGNESIUM: MAGNESIUM: 2.6 mg/dL — AB (ref 1.7–2.4)

## 2018-09-27 LAB — PROCALCITONIN: Procalcitonin: 0.4 ng/mL

## 2018-09-27 MED ORDER — VITAL AF 1.2 CAL PO LIQD
1000.0000 mL | ORAL | Status: DC
  Administered 2018-09-27 – 2018-09-28 (×2): 1000 mL

## 2018-09-27 MED ORDER — DEXTROSE 5 % IV SOLN
INTRAVENOUS | Status: DC
  Administered 2018-09-27 (×2): via INTRAVENOUS

## 2018-09-27 MED ORDER — SODIUM CHLORIDE 0.9 % IV SOLN
0.0000 mg/h | INTRAVENOUS | Status: DC
  Administered 2018-09-27: 2 mg/h via INTRAVENOUS
  Administered 2018-09-28: 3 mg/h via INTRAVENOUS
  Filled 2018-09-27 (×2): qty 10

## 2018-09-27 MED ORDER — FREE WATER
200.0000 mL | Status: DC
  Administered 2018-09-27 – 2018-10-04 (×41): 200 mL

## 2018-09-27 MED ORDER — FENTANYL 2500MCG IN NS 250ML (10MCG/ML) PREMIX INFUSION: 0.0000 ug/h | INTRAVENOUS | Status: DC

## 2018-09-27 NOTE — Progress Notes (Signed)
PULMONARY / CRITICAL CARE MEDICINE   NAME:  Billy Simon, MRN:  [id number], DOB:  07/17/1956, LOS: 7 ADMISSION DATE:  09/14/2018, CONSULTATION DATE: 09/14/2018  REFERRING MD: Cardiology, CHIEF COMPLAINT: Complete heart block  BRIEF HISTORY:    62 year old with h/o complete heart block required a cath and temporary femoral pacer insertion on 09/14/2018.. Intubated for respiratory failure with hypoxia, hypercarbia, bloody frothy sputum / HCAP extensive infx BL.  Briefly on pressors for hypotension.  SIGNIFICANT PAST MEDICAL HISTORY   Depression and anxiety  SIGNIFICANT EVENTS:  09/14/18 Admitted with complete heart block requiring temporary pacer insertion and intubation. 10/16 Extubated. Started on precedex for agitation 10/17 Reintubated for permanent pacemaker insertion.  Kept on vent due to agitation. 10/19 Ongoing agitation, head CT and EEG are unremarkable. Started on broad abx for persistent fevers, trach seceretions 09/19/18 - bronch BAL 0 95% polys 10/24  - sevee agitation, neuro called. Restarted home cogentin, buspar, celexa and increase risperdal to high home dose, failed pecedex, back on diprivan 10/25 - - afebrile since 09/22/18 . Creat slightly up; vanc level 37 and vanc on hold. Needing both precedex and diprivan for agitation control. Home psych meds restarted  Yesterday. This AM followed command. Wife reports improved following commnads. But failed SBT due to tachypnea. Being diuresed - 48m Lasix tid. Improved balance +7L was +10L  10/26 - fluid balance down to +6.5L with diuresis. Aafebrfile. CReat rising again with lasix. WBC rising to 20K.  Failed sBT within few minutes.   09/26/18 - lasix reduced and now on hold following hypotension. +6.3L . Creat better after lasix hold and fluid bolus ,   Had diarrhea but on laxatives  STUDIES:    2D echo- 09/14/2018 Focal basal hypertrophy, LVEF 60-65%, paradoxical septal motion consistent with right ventricular pacing. CT head  09/16/2018- mild ventricular megaly, no acute intracranial findings, right frontal sinusitis. EEG 10/90/19-mild diffuse slowing, no seizures. CT head 10/24 > nil acute, mastoid effusion+, age advanced cerebral atrophy +  CULTURES:  HIV 10/15 - neg MRSA PCR 10/15 - positive Blood culture 10/17 -ng Blood cultures 10/19 -ng Trach aspirate 10/19 - nml flora  Resp BAL 10/20 > 95% polys. NORMal flora Urine culture 10/19-ng ..............Marland Kitchen    ANTIBIOTICS:  Cefazolin 10/17- 10/18 Vanco 10/19 >10/26 Zosyn 10/19 > 10/27 Cefepime 10/27 >>    LINES/TUBES:  09/14/2018 endotracheal tube>>10/16, retubed 10/17 >> 09/14/2018 right femoral pacing>> 10/17 Rt IJ CVL10/19 >>  CONSULTANTS:  09/14/18 pulmonary critical care   SUBJECTIVE/OVERNIGHT/INTERVAL HX   Remains critically ill, orally intubated.  Intermittent agitation Febrile 101 Equal balance, off Lasix   CONSTITUTIONAL: BP 135/68 (BP Location: Left Arm)   Pulse 98   Temp 99.9 F (37.7 C) (Oral)   Resp (!) 35   Ht 6' (1.829 m)   Wt 93 kg   SpO2 100%   BMI 27.81 kg/m   I/O last 3 completed shifts: In: 4404.7 [I.V.:996.7; NG/GT:2350.5; IV Piggyback:1057.5] Out: 4090 [Urine:4090]   Vent Mode: PRVC FiO2 (%):  [40 %] 40 % Set Rate:  [18 bmp] 18 bmp Vt Set:  [620 mL] 620 mL PEEP:  [5 cmH20] 5 cmH20 Pressure Support:  [10 cmH20-15 cmH20] 10 cmH20 Plateau Pressure:  [18 cmH20-26 cmH20] 26 cmH20   Acutely ill, well-developed well-nourished, orally intubated No pallor/icterus Intermittent agitation, RA SS 0, follows commands Decreased breath sounds bilateral, no rhonchi, minimal secretions S1-S2 normal, no murmur Soft nontender abdomen 1+ edema  Chest x-ray personally reviewed which shows improved infiltrate on the  right and partial improved aeration on the left        ASSESSMENT AND PLAN    #Acute respiratory failure in the setting of complete heart block complicated by hypoxia and acidosis.  HCAP /  aspiration pneumonia.   PLAN -Spontaneous breathing trials with goal extubation   #SEvere schizoaffective disorder - multiple drugs # Severe agitation, Acute encephalopathy -   Doubt serotonin syndrome, NMS.  History of anxiety, depression on psych meds x 30 yrs.    PLAN - contninue home psych meds, Risperdal and Celexa - continue pecedex gtt (has pacer and bp tolerating) -Add sitter for safety   #Complete heart block status post right femoral pacing wire placed 09/14/2018 with PPM placed 10/18.  Plan - being paced - Continue Tele monitoring.  #AKI - resolved 09/18/18   -Hold Lasix for now  Hyponatremia-add free water 200 every 4 and D5W at 75/hour   #At risk malnutrition. - Tube feeds via Cortrak.  #Diarrhea - new 09/26/18 - stop laxatives    #Sepsis Syndrome - Pneumonia +/- Sinus - On abx since 09/18/18. Vanc stopped 09/25/18 - On 09/26/18  -rising wbc for days + new fever overnight Low procal reassuring  Plan  -await re culture data - c diff stool - ct cefepime      SUMMARY OF TODAY'S PLAN:   Making some progress with spontaneous breathing trial today so hopeful for trial of extubation rather than tracheostomy. Fevers concerning but low procalcitonin reassuring WBC count is trending back down again, concern for new bout of sepsis and if fevers persist will have to discontinue central line Encephalopathy still limiting weaning  Best Practice / Goals of Care / Disposition.    GOALS OF CARE: Code full FAMILY DISCUSSIONS:   Updated wife daily   DISPOSITION ICU.    ATTESTATION & SIGNATURE   The patient is critically ill with multiple organ systems failure and requires high complexity decision making for assessment and support, frequent evaluation and titration of therapies, application of advanced monitoring technologies and extensive interpretation of multiple databases. Critical Care Time devoted to patient care services described in this note  independent of APP/resident  time is 35 minutes.   Kara Mead MD. [facility] Flood. [place] [facility] Pager [phone] If no response call [phone]   09/27/2018

## 2018-09-27 NOTE — Progress Notes (Addendum)
eLink Physician-Brief Progress Note Patient Name: Billy Simon DOB: [birth year] MRN: [id number]   Date of Service  09/27/2018  HPI/Events of Note  Delirium - Currently on ceiling dose of Prededex IV infusion at ceiling. Family does not want a Fentanyl IV infusion per sign over note.   eICU Interventions  Will order: 1. Low dose Versed IV infusion. Titrate to RASS = 0 to -1.      Intervention Category Major Interventions: Delirium, psychosis, severe agitation - evaluation and management  Sommer,Steven Eugene 09/27/2018, 11:27 PM

## 2018-09-27 NOTE — Progress Notes (Addendum)
Nutrition Follow Up  DOCUMENTATION CODES:   Not applicable  INTERVENTION:   Now that Propofol discontinued:   Increase Vital AF 1.2 back to goal rate of 80 ml/hr (1920 ml per day)  Provides 2304 kcals, 144 gm protein, 1557 ml of free water daily   D/C Prostat   NUTRITION DIAGNOSIS:   Inadequate oral intake related to inability to eat as evidenced by NPO status, ongoing  GOAL:   Patient will meet greater than or equal to 90% of their needs, met  MONITOR:   TF tolerance, Vent status, Labs, Skin, Weight trends, I & O's  ASSESSMENT:   62 yo Male with h/o complete heart block required a cath and temporary femoral pacer insertion on 09/14/2018.    10/18 Cortrak placed, tip in region of Ligament of Treitz  Patient is currently intubated on ventilator support MV: 12.3 L/min Temp (24hrs), Avg:99.8 F (37.7 C), Min:98.6 F (37 C), Max:101.2 F (38.4 C)  Propofol discontinued   Current TF regimen: Vital AF 1.2 at 45 ml/hr with PS 5 times daily via Cortrak tube. CCM note reviewed. Pt's agitation and delirium better. Following commands. Spoke with Gerald Dexter, RN. Pt had loose stools last night. Laxatives D/C'd.   Free water flushes at 200 ml every 4 hours via tube. Labs reviewed. Mg 2.6 (H). Na 156 (H). CBG's 107-122-119.  Hx of schizoaffective disorder, anxiety & depression. Restarted on psychiatric medications. Lasix discontinued.  Diet Order:   Diet Order            Diet NPO time specified  Diet effective now             EDUCATION NEEDS:   Not appropriate for education at this time  Skin:  Skin Assessment: Reviewed RN Assessment  Last BM:  10/28   Intake/Output Summary (Last 24 hours) at 09/27/2018 1259 Last data filed at 09/27/2018 1124 Gross per 24 hour  Intake 2766.79 ml  Output 2265 ml  Net 501.79 ml   Height:   Ht Readings from Last 1 Encounters:  09/19/18 6' (1.829 m)   Weight:   Wt Readings from Last 1 Encounters:  09/27/18 93 kg    Ideal Body Weight:  81 kg  BMI:  Body mass index is 27.81 kg/m.   Estimated Nutritional Needs:   Kcal:  2282  Protein:  135-150 gm  Fluid:  per MD  Arthur Holms, RD, LDN Pager #: [phone] After-Hours Pager #: [phone]

## 2018-09-27 NOTE — Progress Notes (Signed)
Spoke to Dr. Dellie Catholic regarding patient agitation despite max dose of precedex and versed prn pushes. Will start low dose versed drip per orders.   Will continue to monitor patient.

## 2018-09-28 ENCOUNTER — Inpatient Hospital Stay (HOSPITAL_COMMUNITY): Payer: PRIVATE HEALTH INSURANCE

## 2018-09-28 LAB — GLUCOSE, CAPILLARY
GLUCOSE-CAPILLARY: 107 mg/dL — AB (ref 70–99)
GLUCOSE-CAPILLARY: 122 mg/dL — AB (ref 70–99)
GLUCOSE-CAPILLARY: 122 mg/dL — AB (ref 70–99)
Glucose-Capillary: 122 mg/dL — ABNORMAL HIGH (ref 70–99)
Glucose-Capillary: 124 mg/dL — ABNORMAL HIGH (ref 70–99)
Glucose-Capillary: 87 mg/dL (ref 70–99)
Glucose-Capillary: 93 mg/dL (ref 70–99)

## 2018-09-28 LAB — CBC WITH DIFFERENTIAL/PLATELET
Abs Immature Granulocytes: 0.32 10*3/uL — ABNORMAL HIGH (ref 0.00–0.07)
BASOS ABS: 0 10*3/uL (ref 0.0–0.1)
Basophils Relative: 0 %
EOS ABS: 0.4 10*3/uL (ref 0.0–0.5)
EOS PCT: 3 %
HEMATOCRIT: 31.8 % — AB (ref 39.0–52.0)
HEMOGLOBIN: 9.3 g/dL — AB (ref 13.0–17.0)
IMMATURE GRANULOCYTES: 2 %
LYMPHS PCT: 14 %
Lymphs Abs: 1.9 10*3/uL (ref 0.7–4.0)
MCH: 28.2 pg (ref 26.0–34.0)
MCHC: 29.2 g/dL — AB (ref 30.0–36.0)
MCV: 96.4 fL (ref 80.0–100.0)
Monocytes Absolute: 0.7 10*3/uL (ref 0.1–1.0)
Monocytes Relative: 5 %
NEUTROS ABS: 10.4 10*3/uL — AB (ref 1.7–7.7)
Neutrophils Relative %: 76 %
Platelets: 463 10*3/uL — ABNORMAL HIGH (ref 150–400)
RBC: 3.3 MIL/uL — AB (ref 4.22–5.81)
RDW: 16.6 % — AB (ref 11.5–15.5)
WBC: 13.7 10*3/uL — ABNORMAL HIGH (ref 4.0–10.5)
nRBC: 0.1 % (ref 0.0–0.2)

## 2018-09-28 LAB — PHOSPHORUS: Phosphorus: 3.3 mg/dL (ref 2.5–4.6)

## 2018-09-28 LAB — BASIC METABOLIC PANEL
Anion gap: 6 (ref 5–15)
BUN: 33 mg/dL — AB (ref 8–23)
CALCIUM: 8 mg/dL — AB (ref 8.9–10.3)
CO2: 25 mmol/L (ref 22–32)
Chloride: 119 mmol/L — ABNORMAL HIGH (ref 98–111)
Creatinine, Ser: 1.02 mg/dL (ref 0.61–1.24)
GFR calc Af Amer: >60 mL/min (ref >60)
GLUCOSE: 145 mg/dL — AB (ref 70–99)
Potassium: 3.4 mmol/L — ABNORMAL LOW (ref 3.5–5.1)
SODIUM: 150 mmol/L — AB (ref 135–145)

## 2018-09-28 LAB — CULTURE, RESPIRATORY W GRAM STAIN

## 2018-09-28 LAB — CULTURE, RESPIRATORY
CULTURE: NORMAL
SPECIAL REQUESTS: NORMAL

## 2018-09-28 LAB — MAGNESIUM: MAGNESIUM: 2.6 mg/dL — AB (ref 1.7–2.4)

## 2018-09-28 MED ORDER — PANTOPRAZOLE SODIUM 40 MG PO PACK
40.0000 mg | PACK | Freq: Every day | ORAL | Status: DC
  Administered 2018-09-28 – 2018-10-11 (×14): 40 mg
  Filled 2018-09-28 (×14): qty 20

## 2018-09-28 MED ORDER — HALOPERIDOL LACTATE 5 MG/ML IJ SOLN
INTRAMUSCULAR | Status: AC
  Filled 2018-09-28: qty 1

## 2018-09-28 MED ORDER — HALOPERIDOL LACTATE 5 MG/ML IJ SOLN
1.0000 mg | INTRAMUSCULAR | Status: DC | PRN
  Administered 2018-09-28: 4 mg via INTRAVENOUS
  Administered 2018-09-29: 2 mg via INTRAVENOUS
  Administered 2018-09-29: 4 mg via INTRAVENOUS
  Administered 2018-09-29: 2 mg via INTRAVENOUS
  Filled 2018-09-28 (×3): qty 1

## 2018-09-28 MED ORDER — POTASSIUM CHLORIDE 20 MEQ/15ML (10%) PO SOLN
40.0000 meq | Freq: Once | ORAL | Status: AC
  Administered 2018-09-28: 40 meq
  Filled 2018-09-28: qty 30

## 2018-09-28 NOTE — Progress Notes (Signed)
15 ml of Versed wasted at this time with Manus Gunning, RN. Full bag of 50ml Versed returned to pharmacy.

## 2018-09-28 NOTE — Procedures (Signed)
Extubation Procedure Note  Patient Details:   Name: Billy Simon DOB: [birth year] MRN: [id number]   Airway Documentation:    Vent end date: 09/28/18 Vent end time: 1348   Evaluation  O2 sats: stable throughout Complications: No apparent complications Patient did tolerate procedure well. Bilateral Breath Sounds: Coarse crackles, Diminished   Yes   Patient extubated per order to 4L [place] with no apparent complications. Positive cuff leak was noted prior to extubation. Patient is able to weakly speak though it is hard to understand what he is saying. Vitals are stable. RT will continue to monitor.   Zay Yeargan Lajuana Ripple 09/28/2018, 1:56 PM

## 2018-09-28 NOTE — Progress Notes (Signed)
Patient on Precedex and Versed drips, multiple attempts to wean sedation overnight. Unable to make progress due to patient agitation/impulsiveness.

## 2018-09-28 NOTE — Progress Notes (Signed)
PT Cancellation Note  Patient Details Name: Billy Simon MRN: [id number] DOB: July 17, 1956   Cancelled Treatment:    Reason Eval/Treat Not Completed: Medical issues which prohibited therapy(orders received with plan for extubation today. Will hold at this time)   [name] [name] 09/28/2018, 1:25 PM  [name], PT Acute Rehabilitation Services Pager: [phone] Office: [phone]

## 2018-09-28 NOTE — Progress Notes (Signed)
PULMONARY / CRITICAL CARE MEDICINE   NAME:  Billy Simon, MRN:  [id number], DOB:  11/11/1956, LOS: 2 ADMISSION DATE:  09/14/2018, CONSULTATION DATE: 09/14/2018  REFERRING MD: Cardiology, CHIEF COMPLAINT: Complete heart block  BRIEF HISTORY:    62 year old with h/o complete heart block required a cath and temporary femoral pacer insertion on 09/14/2018.. Intubated for respiratory failure with hypoxia, hypercarbia, bloody frothy sputum / HCAP extensive infx BL.  Briefly on pressors for hypotension.  SIGNIFICANT PAST MEDICAL HISTORY   Depression and anxiety  SIGNIFICANT EVENTS:  09/14/18 Admitted with complete heart block requiring temporary pacer insertion and intubation. 10/16 Extubated. Started on precedex for agitation 10/17 Reintubated for permanent pacemaker insertion.  Kept on vent due to agitation. 10/19 Ongoing agitation, head CT and EEG are unremarkable. Started on broad abx for persistent fevers, trach seceretions 09/19/18 - bronch BAL 0 95% polys 10/24  - sevee agitation, neuro called. Restarted home cogentin, buspar, celexa and increase risperdal to high home dose, failed pecedex, back on diprivan 10/25 - - afebrile since 09/22/18 . Creat slightly up; vanc level 37 and vanc on hold. Needing both precedex and diprivan for agitation control. Home psych meds restarted  Yesterday. This AM followed command. Wife reports improved following commnads. But failed SBT due to tachypnea. Being diuresed - 59m Lasix tid. Improved balance +7L was +10L  10/26 - fluid balance down to +6.5L with diuresis. Aafebrfile. CReat rising again with lasix. WBC rising to 20K.  Failed sBT within few minutes.   09/26/18 - lasix reduced and now on hold following hypotension. +6.3L . Creat better after lasix hold and fluid bolus ,   Had diarrhea but on laxatives  STUDIES:    2D echo- 09/14/2018 Focal basal hypertrophy, LVEF 60-65%, paradoxical septal motion consistent with right ventricular pacing. CT head  09/16/2018- mild ventricular megaly, no acute intracranial findings, right frontal sinusitis. EEG 10/90/19-mild diffuse slowing, no seizures. CT head 10/24 > nil acute, mastoid effusion+, age advanced cerebral atrophy +  CULTURES:  HIV 10/15 - neg MRSA PCR 10/15 - positive Blood culture 10/17 -ng Blood cultures 10/19 -ng Trach aspirate 10/19 - nml flora  Resp BAL 10/20 > 95% polys. NORMal flora Urine culture 10/19-ng resp 10/27 >> nml flora ..............Marland Kitchen    ANTIBIOTICS:  Cefazolin 10/17- 10/18 Vanco 10/19 >10/26 Zosyn 10/19 > 10/27 Cefepime 10/27 >>    LINES/TUBES:  09/14/2018 endotracheal tube>>10/16, retubed 10/17 >> 09/14/2018 right femoral pacing>> 10/17 Rt IJ CVL10/19 >>  CONSULTANTS:  09/14/18 pulmonary critical care   SUBJECTIVE/OVERNIGHT/INTERVAL HX   Remains critically ill, orally intubated. Severe agitation overnight requiring Versed drip in addition to Precedex. Low-grade febrile    CONSTITUTIONAL: BP (!) 113/58   Pulse 76   Temp 99.2 F (37.3 C) (Axillary)   Resp (!) 28   Ht 6' (1.829 m)   Wt 92.8 kg   SpO2 100%   BMI 27.75 kg/m   I/O last 3 completed shifts: In: 6251.9 [P.O.:100; I.V.:2560.4; NCB/JS:2831 IV Piggyback:730.5] Out: 3365 [Urine:3365]   Vent Mode: PSV;CPAP FiO2 (%):  [40 %] 40 % Set Rate:  [18 bmp] 18 bmp Vt Set:  [620 mL] 620 mL PEEP:  [5 cmH20] 5 cmH20 Pressure Support:  [10 cDVV61-60cmH20] 10 cmH20 Plateau Pressure:  [17 cmH20-23 cmH20] 18 cmH20   Acutely ill, sedated on Versed drip, orally intubated No pallor/icterus Intermittent agitation, RA SS -2, does not follow commands Decreased breath sounds bilateral, no rhonchi, minimal secretions S1-S2 normal, no murmur Soft nontender abdomen 1+ edema  Chest x-ray personally reviewed which shows dense consolidation left lung, hardware in position       ASSESSMENT AND PLAN    #Acute respiratory failure in the setting of complete heart block complicated by  hypoxia and acidosis.  HCAP / aspiration pneumonia.   PLAN -Spontaneous breathing trials with goal extubation -ET tube has a cuff leak so we will try extubation in the next 24 hours   #SEvere schizoaffective disorder - multiple drugs # Severe agitation, Acute encephalopathy -   Doubt serotonin syndrome, NMS.  History of anxiety, depression on psych meds x 30 yrs.    PLAN - contninue home psych meds, Risperdal and Celexa - continue pecedex gtt (has pacer and bp tolerating) -Minimize Versed drip, prefer off   #Complete heart block with PPM placed 10/18.  Plan - being paced - Continue Tele monitoring.  #AKI - resolved 09/18/18   -Hold Lasix for now -Hypokalemia will be repleted  Hypernatremia-ct  free water 200 every 4 and lower D5W to 50/hour   #At risk malnutrition. - Tube feeds via Cortrak.  #Diarrhea - new 09/26/18 - stop laxatives    #Sepsis Syndrome - Pneumonia +/- Sinus - On abx since 09/18/18. Vanc stopped 09/25/18 - On 09/26/18  -rising wbc for days + new fever overnight Low procal and decreasing WBC count reassuring  Plan  - - ct cefepime x 5-7 ds     SUMMARY OF TODAY'S PLAN:   Making some progress with spontaneous breathing trial so hopeful for trial of extubation rather than tracheostomy.  Also ET tube has a cuff leak so we will trial sooner rather than later Fevers concerning but low procalcitonin reassuring WBC count is trending back down again and effervescing- if fevers persist will have to discontinue central line Encephalopathy/agitation still limiting weaning  Best Practice / Goals of Care / Disposition.    GOALS OF CARE: Code full FAMILY DISCUSSIONS:   Updated wife daily   DISPOSITION ICU.    ATTESTATION & SIGNATURE   The patient is critically ill with multiple organ systems failure and requires high complexity decision making for assessment and support, frequent evaluation and titration of therapies, application of advanced  monitoring technologies and extensive interpretation of multiple databases. Critical Care Time devoted to patient care services described in this note independent of APP/resident  time is 34 minutes.   Kara Mead MD. Shade Flood. [facility] Pager [phone] If no response call [phone]    09/28/2018

## 2018-09-29 ENCOUNTER — Inpatient Hospital Stay (HOSPITAL_COMMUNITY): Payer: PRIVATE HEALTH INSURANCE

## 2018-09-29 ENCOUNTER — Ambulatory Visit: Payer: PRIVATE HEALTH INSURANCE

## 2018-09-29 LAB — BASIC METABOLIC PANEL
Anion gap: 7 (ref 5–15)
BUN: 29 mg/dL — ABNORMAL HIGH (ref 8–23)
CHLORIDE: 120 mmol/L — AB (ref 98–111)
CO2: 19 mmol/L — AB (ref 22–32)
CREATININE: 0.93 mg/dL (ref 0.61–1.24)
Calcium: 8.1 mg/dL — ABNORMAL LOW (ref 8.9–10.3)
GFR calc Af Amer: >60 mL/min (ref >60)
GFR calc non Af Amer: >60 mL/min (ref >60)
Glucose, Bld: 94 mg/dL (ref 70–99)
POTASSIUM: 5.4 mmol/L — AB (ref 3.5–5.1)
SODIUM: 146 mmol/L — AB (ref 135–145)

## 2018-09-29 LAB — CBC
HEMATOCRIT: 35.9 % — AB (ref 39.0–52.0)
HEMOGLOBIN: 10.9 g/dL — AB (ref 13.0–17.0)
MCH: 29 pg (ref 26.0–34.0)
MCHC: 30.4 g/dL (ref 30.0–36.0)
MCV: 95.5 fL (ref 80.0–100.0)
Platelets: 381 10*3/uL (ref 150–400)
RBC: 3.76 MIL/uL — ABNORMAL LOW (ref 4.22–5.81)
RDW: 16.4 % — ABNORMAL HIGH (ref 11.5–15.5)
WBC: 12.9 10*3/uL — AB (ref 4.0–10.5)
nRBC: 0 % (ref 0.0–0.2)

## 2018-09-29 LAB — GLUCOSE, CAPILLARY
GLUCOSE-CAPILLARY: 86 mg/dL (ref 70–99)
GLUCOSE-CAPILLARY: 94 mg/dL (ref 70–99)
GLUCOSE-CAPILLARY: 99 mg/dL (ref 70–99)
Glucose-Capillary: 82 mg/dL (ref 70–99)
Glucose-Capillary: 85 mg/dL (ref 70–99)
Glucose-Capillary: 87 mg/dL (ref 70–99)

## 2018-09-29 LAB — MAGNESIUM: Magnesium: 2.5 mg/dL — ABNORMAL HIGH (ref 1.7–2.4)

## 2018-09-29 LAB — PHOSPHORUS: PHOSPHORUS: 3.9 mg/dL (ref 2.5–4.6)

## 2018-09-29 MED ORDER — QUETIAPINE FUMARATE 100 MG PO TABS
100.0000 mg | ORAL_TABLET | Freq: Every day | ORAL | Status: DC
  Administered 2018-09-29: 100 mg via ORAL
  Filled 2018-09-29: qty 1

## 2018-09-29 MED ORDER — BISACODYL 5 MG PO TBEC: 5.0000 mg | DELAYED_RELEASE_TABLET | Freq: Every day | ORAL | Status: DC | PRN

## 2018-09-29 MED ORDER — VITAL AF 1.2 CAL PO LIQD
1000.0000 mL | ORAL | Status: DC
  Administered 2018-09-29 – 2018-10-04 (×5): 1000 mL
  Filled 2018-09-29 (×2): qty 1500
  Filled 2018-09-29: qty 1000
  Filled 2018-09-29: qty 1500

## 2018-09-29 MED ORDER — POLYETHYLENE GLYCOL 3350 17 G PO PACK
17.0000 g | PACK | Freq: Every day | ORAL | Status: DC
  Administered 2018-09-29 – 2018-10-11 (×6): 17 g via ORAL
  Filled 2018-09-29 (×8): qty 1

## 2018-09-29 MED ORDER — BISACODYL 10 MG RE SUPP
10.0000 mg | Freq: Every day | RECTAL | Status: DC | PRN
  Administered 2018-09-29: 10 mg via RECTAL

## 2018-09-29 NOTE — Progress Notes (Signed)
Pt straining. Bowel regime ordered. Failed swallow, order to restart tf placed. Family updated

## 2018-09-29 NOTE — Progress Notes (Addendum)
Patient agitation increasing. Dr. Violet Baldy contacted for orders. Message taken by Deloria Lair, RN.

## 2018-09-29 NOTE — Progress Notes (Signed)
Patient agitation increasing. Patient raising hands and attempting to hit sitter. Dr. Violet Baldy contacted. Orders received. See orders/MAR.

## 2018-09-29 NOTE — Progress Notes (Signed)
eLink Physician-Brief Progress Note Patient Name: Billy Simon DOB: Mar 10, 1956 MRN: [id number]   Date of Service  09/29/2018  HPI/Events of Note  Notified of agitation despite Precedex, Haldol and Risperdal.  Patient has not been able to sleep for several days according to wife  eICU Interventions  Ordered Seroquel 100 mg q hs Bilateral soft wrist restraints ordered     Intervention Category Major Interventions: Delirium, psychosis, severe agitation - evaluation and management  Irving Burton T Damontay Alred 09/29/2018, 8:30 PM

## 2018-09-29 NOTE — Evaluation (Signed)
Physical Therapy Evaluation Patient Details Name: Billy Simon MRN: [id number] DOB: 1956-09-07 Today's Date: 09/29/2018   History of Present Illness  62 yo admitted with complete heart block 10/15 requiring temp pacer and intubation, extubated 10/16, reintubated 10/17 and permanent pacer placed, 10/24 severe agitation with encephalopathy. Extubated 10/29. PMHx: schizoaffective disorder  Clinical Impression  Pt admitted with above diagnosis. Pt currently with functional limitations due to the deficits listed below (see PT Problem List). Prior to admission, patient was independent with mobility/ADL's and works as an Chiropodist. On PT evaluation, patient alert and oriented to self only, but following simple commands > 75% of the time. Currently requiring min assist to progress to sitting edge of bed and maintain static seated balance for > 2 minutes. Pt perseverating on "I'm tired," and intermittently leaning forward with eyes closed. BP assessed once returned to supine 93/55, 69 HR. Recommending CIR to maximize functional independence and decrease caregiver burden. Suspect patient will progress well based on motivation, PLOF, and great family support. Pt will benefit from skilled PT to increase their independence and safety with mobility to allow discharge to the venue listed below.       Follow Up Recommendations CIR;Supervision/Assistance - 24 hour    Equipment Recommendations  Other (comment)(TBD)    Recommendations for Other Services Rehab consult;OT consult     Precautions / Restrictions Precautions Precautions: Fall;ICD/Pacemaker Precaution Comments: NG tube, restraints Restrictions Weight Bearing Restrictions: No      Mobility  Bed Mobility Overal bed mobility: Needs Assistance Bed Mobility: Supine to Sit;Sit to Supine     Supine to sit: Min assist Sit to supine: Min assist   General bed mobility comments: Min assist + 1 for progression from supine <> sit    Transfers                 General transfer comment: Deferred as patient was repetitively stating "I'm tired," and requested to lie back down  Ambulation/Gait                Stairs            Wheelchair Mobility    Modified Rankin (Stroke Patients Only)       Balance Overall balance assessment: Needs assistance Sitting-balance support: Feet unsupported;No upper extremity supported Sitting balance-Leahy Scale: Fair Sitting balance - Comments: Min guard for safety. Cues for hand support                                     Pertinent Vitals/Pain Pain Assessment: Faces Faces Pain Scale: Hurts little more Pain Location: generalized with movement Pain Descriptors / Indicators: Grimacing Pain Intervention(s): Monitored during session    Home Living Family/patient expects to be discharged to:: Private residence Living Arrangements: Spouse/significant other;Children(daughter) Available Help at Discharge: Family Type of Home: House Home Access: Stairs to enter Entrance Stairs-Rails: Left Entrance Stairs-Number of Steps: 4 Home Layout: One level Home Equipment: Shower seat - built in      Prior Function Level of Independence: Independent         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     Higher education careers adviser        Extremity/Trunk Assessment   Upper Extremity Assessment Upper Extremity Assessment: Overall WFL for tasks assessed    Lower Extremity Assessment Lower Extremity Assessment: Overall WFL for tasks assessed       Communication  Communication: No difficulties  Cognition Arousal/Alertness: Awake/alert Behavior During Therapy: Flat affect Overall Cognitive Status: Impaired/Different from baseline Area of Impairment: Orientation;Attention;Memory;Following commands;Safety/judgement;Awareness                 Orientation Level: Disoriented to;Place;Time;Situation Current Attention Level:  Sustained Memory: Decreased short-term memory Following Commands: Follows one step commands inconsistently Safety/Judgement: Decreased awareness of safety;Decreased awareness of deficits Awareness: Intellectual   General Comments: Pt oriented to self only, perseverating on statements.       General Comments General comments (skin integrity, edema, etc.): Session on 4L O2    Exercises     Assessment/Plan    PT Assessment Patient needs continued PT services  PT Problem List Decreased strength;Decreased activity tolerance;Decreased balance;Decreased mobility;Decreased cognition;Decreased safety awareness       PT Treatment Interventions DME instruction;Gait training;Stair training;Functional mobility training;Therapeutic activities;Therapeutic exercise;Balance training;Patient/family education    PT Goals (Current goals can be found in the Care Plan section)  Acute Rehab PT Goals Patient Stated Goal: "get water." PT Goal Formulation: With patient Time For Goal Achievement: 10/13/18 Potential to Achieve Goals: Good    Frequency Min 3X/week   Barriers to discharge        Co-evaluation               AM-PAC PT "6 Clicks" Daily Activity  Outcome Measure Difficulty turning over in bed (including adjusting bedclothes, sheets and blankets)?: Unable Difficulty moving from lying on back to sitting on the side of the bed? : Unable Difficulty sitting down on and standing up from a chair with arms (e.g., wheelchair, bedside commode, etc,.)?: A Little Help needed moving to and from a bed to chair (including a wheelchair)?: A Lot Help needed walking in hospital room?: A Lot Help needed climbing 3-5 steps with a railing? : Total 6 Click Score: 10    End of Session Equipment Utilized During Treatment: Oxygen Activity Tolerance: Patient limited by fatigue Patient left: in bed;with call bell/phone within reach;with family/visitor present Nurse Communication: Mobility status PT  Visit Diagnosis: Other abnormalities of gait and mobility (R26.89)    Time: 0981-1914 PT Time Calculation (min) (ACUTE ONLY): 28 min   Charges:   PT Evaluation $PT Eval Moderate Complexity: 1 Mod PT Treatments $Therapeutic Activity: 8-22 mins        Laurina Bustle, PT, DPT Acute Rehabilitation Services Pager [phone] Office [phone]   Vanetta Mulders 09/29/2018, 11:23 AM

## 2018-09-29 NOTE — Progress Notes (Signed)
Modified Barium Swallow Progress Note  Patient Details  Name: Billy Simon MRN: [id number] Date of Birth: 1956/10/28  Today's Date: 09/29/2018  Modified Barium Swallow completed.  Full report located under Chart Review in the Imaging Section.  Brief recommendations include the following:  Clinical Impression  Pt exhibits severe pharyngeal, mild oral dysphagia, evidenced by gross aspiration as well as instances of deep penetration of thickened liquids due to decreased airway closure/protection. Large nectar thick bolus was aspirated during the swallow with strong reflexive cough and additional trial penetrated to the vocal folds without sensation due to markedly decreased epiglottic inversion. Honey and puree were also penetrated to the vocal folds without sensation with honey thick silently aspirated after the swallow. In addition, honey thick liquids produced mild lingual, mild-mod vallecular, and pyriform sinsus residue. Pt's pharyngeal edema (from prolonged intubation) and reduced airway closure due to insufficient epiglottic inversion currenly pose a severe aspiration risk for POs of all textures. Thus, recommend pt continue NPO with alternative means for nutrition, and ST will continue to follow to provide treatment (pharyngeal exercises, respiratory muscle strength trainng) with PO trials and repeat MBSS without Cortrak placed when determined ready.   Swallow Evaluation Recommendations       SLP Diet Recommendations: NPO;Alternative means - temporary                       Oral Care Recommendations: Oral care QID        [name] 09/29/2018,2:09 PM   Breck Coons [place].Ed Nurse, children's [phone] Office [phone]

## 2018-09-29 NOTE — Progress Notes (Signed)
Continued delirium, ao self only. Haldol prn, dex gtts infusing. Safety mitts, belt in place. SR vs ST to 117. SBP stable, map >65. LS dim, coarse lowers. 2L, dips to 88 when weaned. Incision WNL. New dressing to sacrum. Bowel regime initiated. no BM. External catheter replaced, + void without issue. Family @ bedside. Sitter ordered for tonight for safety.

## 2018-09-29 NOTE — Progress Notes (Signed)
Pharmacy Antibiotic Note  Billy Simon is a 62 y.o. male admitted on 09/14/2018 with CHB with perm pacer placed 10/17. Patient was extubated on 10/29 but remains agitated. Pharmacy has been consulted to change zosyn to cefepime for HCAP.    Today is day 4 of cefepime. Patient is afebrile, WBC down to 13, and renal function is stable. All cultures are negative.  Plan: -Continue Cefepime 2g IV q8h -Follow up length of therapy   Height: 6' (182.9 cm) Weight: 203 lb 4.2 oz (92.2 kg) IBW/kg (Calculated) : 77.6  Temp (24hrs), Avg:97.7 F (36.5 C), Min:97.3 F (36.3 C), Max:98.1 F (36.7 C)  Recent Labs  Lab 09/23/18 2303  09/24/18 1324 09/25/18 0024 09/25/18 0436  09/26/18 0500 09/26/18 1149 09/27/18 0533 09/27/18 1737 09/28/18 0428 09/29/18 0732  WBC  --    < >  --   --  20.1*  --  21.5*  --  18.8*  --  13.7* 12.9*  CREATININE  --    < >  --  1.40*  --    < > 1.35*  --  1.13 1.09 1.02 0.93  LATICACIDVEN  --   --   --  1.8  --   --  1.4 1.2 1.0  --   --   --   VANCOTROUGH 37*  --   --   --   --   --   --   --   --   --   --   --   VANCORANDOM  --   --  15  --   --   --   --   --   --   --   --   --    < > = values in this interval not displayed.    Estimated Creatinine Clearance: 91.6 mL/min (by C-G formula based on SCr of 0.93 mg/dL).    No Known Allergies   Antimicrobials this Admission: 10/19 vanc>>10/22, restarted 10/23 >>10/26 10/19 zosyn>>10/27 10/27 cefepime>>   Arvilla Market, PharmD PGY1 Pharmacy Resident Phone [phone] 09/29/2018     1:44 PM

## 2018-09-29 NOTE — Plan of Care (Signed)
  Problem: Clinical Measurements: Goal: Ability to maintain clinical measurements within normal limits will improve Outcome: Progressing Goal: Diagnostic test results will improve Outcome: Progressing Goal: Respiratory complications will improve Outcome: Progressing Goal: Cardiovascular complication will be avoided Outcome: Progressing   Problem: Nutrition: Goal: Adequate nutrition will be maintained Outcome: Progressing   Problem: Elimination: Goal: Will not experience complications related to bowel motility Outcome: Progressing Goal: Will not experience complications related to urinary retention Outcome: Progressing   Problem: Education: Goal: Knowledge of General Education information will improve Description Including pain rating scale, medication(s)/side effects and non-pharmacologic comfort measures Outcome: Not Progressing   Problem: Health Behavior/Discharge Planning: Goal: Ability to manage health-related needs will improve Outcome: Not Progressing   Problem: Coping: Goal: Level of anxiety will decrease Outcome: Not Progressing   Patient agitation increased. Physician contacted for orders. Orders received. See MAR.

## 2018-09-29 NOTE — Progress Notes (Signed)
Rehab Admissions Coordinator Note:  Patient was screened by Clois Dupes for appropriateness for an Inpatient Acute Rehab Consult per PT recommendation. SLP and OT evals pending. Once pt progresses further with therapy, I would recommend an int rehab consult. I will follow.    Ottie Glazier, RN, MSN Rehab Admissions Coordinator [phone] 09/29/2018 11:47 AM

## 2018-09-29 NOTE — Progress Notes (Signed)
PULMONARY / CRITICAL CARE MEDICINE   NAME:  Billy Simon, MRN:  [id number], DOB:  07-03-1956, LOS: 45 ADMISSION DATE:  09/14/2018, CONSULTATION DATE: 09/14/2018  REFERRING MD: Cardiology, CHIEF COMPLAINT: Complete heart block  BRIEF HISTORY:    62 year old with h/o complete heart block required a cath and temporary femoral pacer insertion on 09/14/2018.. Intubated for respiratory failure with hypoxia, hypercarbia, bloody frothy sputum / HCAP extensive infx BL.  Briefly on pressors for hypotension.  SIGNIFICANT PAST MEDICAL HISTORY   Depression and anxiety  SIGNIFICANT EVENTS:  09/14/18 Admitted with complete heart block requiring temporary pacer insertion and intubation. 10/16 Extubated. Started on precedex for agitation 10/17 Reintubated for permanent pacemaker insertion.  Kept on vent due to agitation. 10/19 Ongoing agitation, head CT and EEG are unremarkable. Started on broad abx for persistent fevers, trach seceretions 09/19/18 - bronch BAL 0 95% polys 10/24  - sevee agitation, neuro called. Restarted home cogentin, buspar, celexa and increase risperdal to high home dose, failed pecedex, back on diprivan 10/25 - - afebrile since 09/22/18 . Creat slightly up; vanc level 37 and vanc on hold. Needing both precedex and diprivan for agitation control. Home psych meds restarted  Yesterday. This AM followed command. Wife reports improved following commnads. But failed SBT due to tachypnea. Being diuresed - 51m Lasix tid. Improved balance +7L was +10L  10/26 - fluid balance down to +6.5L with diuresis. Aafebrfile. CReat rising again with lasix. WBC rising to 20K.  Failed sBT within few minutes.   09/26/18 - lasix reduced and now on hold following hypotension. +6.3L . Creat better after lasix hold and fluid bolus ,   Had diarrhea but on laxatives  09/28/2018 extubated  32,019 with agitation requiring copious sedation  STUDIES:    2D echo- 09/14/2018 Focal basal hypertrophy, LVEF 60-65%,  paradoxical septal motion consistent with right ventricular pacing. CT head 09/16/2018- mild ventricular megaly, no acute intracranial findings, right frontal sinusitis. EEG 10/90/19-mild diffuse slowing, no seizures. CT head 10/24 > nil acute, mastoid effusion+, age advanced cerebral atrophy +  CULTURES:  HIV 10/15 - neg MRSA PCR 10/15 - positive Blood culture 10/17 -ng Blood cultures 10/19 -ng Trach aspirate 10/19 - nml flora  Resp BAL 10/20 > 95% polys. NORMal flora Urine culture 10/19-ng resp 10/27 >> nml flora ..............Marland Kitchen    ANTIBIOTICS:  Cefazolin 10/17- 10/18 Vanco 10/19 >10/26 Zosyn 10/19 > 10/27 Cefepime 10/27 >>    LINES/TUBES:  09/14/2018 endotracheal tube>>10/16, retubed 10/17 >> extubated 09/28/2018 09/14/2018 right femoral pacing>> 10/17 Rt IJ CVL10/19 >>  CONSULTANTS:  09/14/18 pulmonary critical care   SUBJECTIVE/OVERNIGHT/INTERVAL HX  Remains extubated Requiring Versed and Precedex to keep him in the bed. Will call psychiatric consult He is scheduled for swallow eval If he passes swallow evaluation will start his home psychotropic drug    CONSTITUTIONAL: BP 103/61   Pulse 60   Temp 97.7 F (36.5 C) (Oral)   Resp 15   Ht 6' (1.829 m)   Wt 92.2 kg   SpO2 97%   BMI 27.57 kg/m   I/O last 3 completed shifts: In: 5195.3 [I.V.:2370.5; NG/GT:2224.7; IV Piggyback:600.2] Out: 3805 [Urine:3805]   Vent Mode: PSV;CPAP FiO2 (%):  [36 %-40 %] 36 % PEEP:  [5 cmH20] 5 cmH20 Pressure Support:  [5 cmH20-10 cmH20] 5 cmH20   General: 62year old male who is having periods of agitation requiring heavy doses of Versed and Precedex HEENT: No JVD or lymphadenopathy is appreciated Neuro: Appears sedated somewhat confused nurses report agitation and attempt  to get out of bed CV: Heart sounds are regular PULM: Creased breath sounds in the left GS:UPJS, non-tender, bsx4 active  Extremities: warm/dry, plus edema  Skin: no rashes or  lesions    09/29/2018 chest x-ray reviewed: Left airspace disease with left side less irrigated.  Tracheal tubes were removed.  Permanent pacemaker is noted       ASSESSMENT AND PLAN    #Acute respiratory failure in the setting of complete heart block complicated by hypoxia and acidosis.  HCAP / aspiration pneumonia. -Extubated 09/28/2018 -Pulmonary toilet -Mobilization    Severe schizoaffective disorder - multiple drugs  Severe agitation, Acute encephalopathy -   Doubt serotonin syndrome, NMS.  History of anxiety, depression on psych meds x 30 yrs.  -Requiring Precedex and Versed -Not currently getting psychiatric medications due to altered mental status he is normally on Wellbutrin BuSpar Celexa Xanax and Risperdal -Psych consult -Stop Versed and fentanyl -Use Haldol -Hopefully resume home medications once speech eval is completed   #Complete heart block with PPM placed 10/18. -Paced as needed -Cardiology is following  #AKI - resolved 09/18/18   Lab Results  Component Value Date   CREATININE 1.02 09/28/2018   CREATININE 1.09 09/27/2018   CREATININE 1.13 09/27/2018    -Hold Lasix  Hypernatremia Recent Labs  Lab 09/27/18 0533 09/27/18 1737 09/28/18 0428  NA 156* 153* 150*    - continue free water D5W sodium is gradually coming down -Monitor sodium   #At risk malnutrition. - Tube feeds via [employer].  #Diarrhea - new 09/26/18 -Stop accidents -Monitor    #Sepsis Syndrome - Pneumonia +/- Sinus -He is off vancomycin continue current cefepime -No positive culture data currently on cefepime     SUMMARY OF TODAY'S PLAN:   Is been extubated but is been agitated and requiring heavy doses sedation to remain  in the bed.  Acquire Precedex Versed and physical restraints.  He is managed to stay off require mechanical ventilatory support.  Best Practice / Goals of Care / Disposition.    GOALS OF CARE: Code full FAMILY DISCUSSIONS:   09/29/2018 updated  wife she appears upset with the fact is not getting better require copious sedation.   DISPOSITION ICU.    Billy Simon ACNP Maryanna Shape PCCM Pager [phone] till 1 pm If no answer page [phone] 09/29/2018, 9:20 AM

## 2018-09-29 NOTE — Progress Notes (Signed)
Sitter ordered for safety 2/2 ICU delirium. Family aware

## 2018-09-29 NOTE — Progress Notes (Signed)
  Speech Language Pathology Patient Details Name: Billy Simon MRN: [id number] DOB: 06-22-1956 Today's Date: 09/29/2018 Time:  -      MBS scheduled today at 12:45                [name] 09/29/2018, 9:55 AM   [name].Ed Nurse, children's [phone] Office [phone]

## 2018-09-30 ENCOUNTER — Inpatient Hospital Stay (HOSPITAL_COMMUNITY): Payer: PRIVATE HEALTH INSURANCE

## 2018-09-30 DIAGNOSIS — F05 Delirium due to known physiological condition: Secondary | ICD-10-CM

## 2018-09-30 LAB — CBC
HCT: 31.7 % — ABNORMAL LOW (ref 39.0–52.0)
Hemoglobin: 9.6 g/dL — ABNORMAL LOW (ref 13.0–17.0)
MCH: 27.9 pg (ref 26.0–34.0)
MCHC: 30.3 g/dL (ref 30.0–36.0)
MCV: 92.2 fL (ref 80.0–100.0)
NRBC: 0 % (ref 0.0–0.2)
Platelets: 510 10*3/uL — ABNORMAL HIGH (ref 150–400)
RBC: 3.44 MIL/uL — AB (ref 4.22–5.81)
RDW: 15.7 % — ABNORMAL HIGH (ref 11.5–15.5)
WBC: 15.5 10*3/uL — ABNORMAL HIGH (ref 4.0–10.5)

## 2018-09-30 LAB — MAGNESIUM: MAGNESIUM: 2.3 mg/dL (ref 1.7–2.4)

## 2018-09-30 LAB — BASIC METABOLIC PANEL
Anion gap: 8 (ref 5–15)
BUN: 26 mg/dL — AB (ref 8–23)
CO2: 21 mmol/L — ABNORMAL LOW (ref 22–32)
Calcium: 7.9 mg/dL — ABNORMAL LOW (ref 8.9–10.3)
Chloride: 117 mmol/L — ABNORMAL HIGH (ref 98–111)
Creatinine, Ser: 0.95 mg/dL (ref 0.61–1.24)
GFR calc Af Amer: >60 mL/min (ref >60)
GFR calc non Af Amer: >60 mL/min (ref >60)
GLUCOSE: 107 mg/dL — AB (ref 70–99)
Potassium: 3.1 mmol/L — ABNORMAL LOW (ref 3.5–5.1)
SODIUM: 146 mmol/L — AB (ref 135–145)

## 2018-09-30 LAB — GLUCOSE, CAPILLARY
GLUCOSE-CAPILLARY: 113 mg/dL — AB (ref 70–99)
GLUCOSE-CAPILLARY: 108 mg/dL — AB (ref 70–99)
Glucose-Capillary: 100 mg/dL — ABNORMAL HIGH (ref 70–99)
Glucose-Capillary: 105 mg/dL — ABNORMAL HIGH (ref 70–99)
Glucose-Capillary: 105 mg/dL — ABNORMAL HIGH (ref 70–99)
Glucose-Capillary: 96 mg/dL (ref 70–99)

## 2018-09-30 LAB — PHOSPHORUS: Phosphorus: 3.3 mg/dL (ref 2.5–4.6)

## 2018-09-30 MED ORDER — POTASSIUM CHLORIDE 20 MEQ/15ML (10%) PO SOLN
20.0000 meq | Freq: Once | ORAL | Status: AC
  Administered 2018-09-30: 20 meq
  Filled 2018-09-30: qty 15

## 2018-09-30 MED ORDER — BUSPIRONE HCL 5 MG PO TABS
15.0000 mg | ORAL_TABLET | Freq: Every day | ORAL | Status: DC
  Administered 2018-10-01 – 2018-10-11 (×11): 15 mg via ORAL
  Filled 2018-09-30: qty 3
  Filled 2018-09-30: qty 1
  Filled 2018-09-30: qty 3
  Filled 2018-09-30 (×2): qty 1
  Filled 2018-09-30 (×6): qty 3
  Filled 2018-09-30: qty 1

## 2018-09-30 MED ORDER — POTASSIUM CHLORIDE 20 MEQ/15ML (10%) PO SOLN
40.0000 meq | Freq: Once | ORAL | Status: AC
  Administered 2018-09-30: 40 meq
  Filled 2018-09-30: qty 30

## 2018-09-30 MED ORDER — ALPRAZOLAM 0.5 MG PO TABS
1.0000 mg | ORAL_TABLET | Freq: Once | ORAL | Status: AC | PRN
  Administered 2018-09-30: 1 mg via ORAL
  Filled 2018-09-30: qty 2

## 2018-09-30 MED ORDER — ALPRAZOLAM 0.5 MG PO TABS
0.5000 mg | ORAL_TABLET | Freq: Two times a day (BID) | ORAL | Status: DC | PRN
  Administered 2018-09-30 – 2018-10-07 (×4): 0.5 mg via ORAL
  Filled 2018-09-30 (×6): qty 1

## 2018-09-30 NOTE — Progress Notes (Signed)
  Speech Language Pathology Treatment: Dysphagia  Patient Details Name: Billy Simon MRN: [id number] DOB: Aug 21, 1956 Today's Date: 09/30/2018 Time: 4098-1191 SLP Time Calculation (min) (ACUTE ONLY): 30 min  Assessment / Plan / Recommendation Clinical Impression  Pt was alert and cooperative throughout dysphagia treatment session although required frequent cueing to sustain attention to task due to anxiety. Following oral care, pt accepted upgraded ice chip trials provided mod verbal cues without overt s/s aspiration. Given mod-max verbal/visual/tactile cues, pt also completed expiratory muscle strength training (EMST) exercises with Vear Clock device set at various pressures and determined 17 cm H2O with adequate but not excessive resistance/effort per pt. SLP educated pt and family regarding exercise program - to complete 10 reps with EMST device twice daily. Pharyngeal strengthening also facilitated via sustained "e" and exercises to increase vocal intensity. Recommend continue NPO except for 1-3 ice chips with FULL supervision/assist and only AFTER oral care. ST will continue to follow to provide treatment with pharyngeal strengthening exercises and determine readiness for repeat MBSS. Prognosis is good for return to po's possibly at a slightly slower rate given anxiety and cognitive deficits.    HPI HPI: 62 year old with a history of syncope who presented with complete heart block requiring intubation 10/15-10/16, reintubated 10/17-10/29 for pacemaker insertion. Pt hypoxic hypercarbic and acidotic in cath labl for right femoral temporary pacing wire placement. Exhibited severe schizoaffective disorder, severe agitation, acute encephalopathy. CT head 10/24 > nil acute, mastoid effusion+, age advanced cerebral atrophy +      SLP Plan  Continue with current plan of care       Recommendations  Diet recommendations: NPO;Other(comment)(1-2 ice chips with oral care before and full  supervision) Medication Administration: Via alternative means Postural Changes and/or Swallow Maneuvers: Seated upright 90 degrees                Oral Care Recommendations: Oral care QID;Oral care prior to ice chip/H20 Follow up Recommendations: Inpatient Rehab SLP Visit Diagnosis: Dysphagia, unspecified (R13.10) Plan: Continue with current plan of care       Suzzette Righter, Student SLP                Suzzette Righter 09/30/2018, 1:30 PM

## 2018-09-30 NOTE — Care Management Note (Signed)
Case Management Note  Patient Details  Name: Billy Simon MRN: [id number] Date of Birth: 02-12-1956  Subjective/Objective:  62yo male presented from home with CHB. PMH: syncope.    Action/Plan: Patient lived at home with spouse PTA. Patient remains critically ill; required a temporary pacer insertion and was intubated for respiratory failure on 10/15, then extubated on 10/29 and has been agitated with ongoing delirium requiring a sitter. PT recommends CIR with the Rehab Admission Coordinator following. Patient will need an order for IP Rehab Consult to further eval for appropriateness. CM will continue to follow.   Expected Discharge Date:                  Expected Discharge Plan:  IP Rehab Facility  In-House Referral:  NA  Discharge planning Services  CM Consult  Post Acute Care Choice:  NA Choice offered to:  NA  DME Arranged:  N/A DME Agency:  NA  HH Arranged:  NA HH Agency:  NA  Status of Service:  In process, will continue to follow  If discussed at Long Length of Stay Meetings, dates discussed:    Additional Comments:  Colleen Can RN, BSN, NCM-BC, ACM-RN [phone] 09/30/2018, 10:19 AM

## 2018-09-30 NOTE — Progress Notes (Signed)
Patient continues to show agitation. Dr. Violet Baldy called. Abby, RN relayed message. Waiting for orders.

## 2018-09-30 NOTE — Progress Notes (Signed)
62 year old with h/o complete heart block required a cath and temporary femoral pacer insertion on 09/14/2018.. Intubated for respiratory failure with hypoxia, hypercarbia, bloody frothy sputum / HCAP extensive infx BL.  Briefly on pressors for hypotension.  He was extubated 10/29 and has tolerated well but continues with ongoing delirium especially during the night, still requires Precedex drip.  On exam-weak and deconditioned, working with physical therapy, hoarse voice, decreased breath sounds bilateral, soft and obese abdomen, 1+ edema  Chest x-ray shows diffuse opacity in the left and improved right-sided aeration and infiltrates.  Labs show hypokalemia otherwise normal electrolytes, increasing leukocytosis.  impression/plan  Acute respiratory failure-resolving and hypoxia is improving  HCAP -continue cefepime total 10 to 14 days Monitor leukocytosis  Acute metabolic encephalopathy-continue Risperdal, Celexa and buspirone, taper Precedex drip Add Xanax 0.5 mg twice daily as needed  Expect to progress with physical therapy. Will get psych input  Dysphagia - Repeat swallow evaluation in 2-3 days  Hypernatremia has improved, off D5 drip now. Continue free water via tube.  The patient is critically ill with multiple organ systems failure and requires high complexity decision making for assessment and support, frequent evaluation and titration of therapies, application of advanced monitoring technologies and extensive interpretation of multiple databases. Critical Care Time devoted to patient care services described in this note independent of APP/resident  time is 31 minutes.   Comer Locket Vassie Loll MD

## 2018-09-30 NOTE — Progress Notes (Signed)
Physical Therapy Treatment Patient Details Name: Billy Simon MRN: [id number] DOB: [birth year] Today's Date: 09/30/2018    History of Present Illness 62 yo admitted with complete heart block 10/15 requiring temp pacer and intubation, extubated 10/16, reintubated 10/17 and permanent pacer placed, 10/24 severe agitation with encephalopathy. Extubated 10/29. PMHx: schizoaffective disorder    PT Comments    Patient progressing slowly and steadily towards his physical therapy goals. Requiring two person maximal assistance to transfer from bed to chair. Persistent restlessness and decreased safety awareness. Continue to recommend comprehensive inpatient rehab (CIR) for post-acute therapy needs.    Follow Up Recommendations  CIR;Supervision/Assistance - 24 hour     Equipment Recommendations  Other (comment)(TBD)    Recommendations for Other Services Rehab consult;OT consult     Precautions / Restrictions Precautions Precautions: Fall;ICD/Pacemaker Precaution Comments: NG tube, restraints Restrictions Weight Bearing Restrictions: No    Mobility  Bed Mobility Overal bed mobility: Needs Assistance Bed Mobility: Rolling;Sidelying to Sit Rolling: Min assist Sidelying to sit: Max assist       General bed mobility comments: Pt rolling towards right to perform peri care and change foam dressing, requiring minA + 1. Requiring maxA + 1 to progress from sidelying to sitting for trunk elevation  Transfers Overall transfer level: Needs assistance Equipment used: 2 person hand held assist Transfers: Squat Pivot Transfers     Squat pivot transfers: Max assist;+2 physical assistance     General transfer comment: Patient with good initiation, but difficulty with obtaining adequate hip extension. Requiring maxA + 2 for squat pivot towards right  Ambulation/Gait                 Stairs             Wheelchair Mobility    Modified Rankin (Stroke Patients Only)        Balance Overall balance assessment: Needs assistance Sitting-balance support: Feet unsupported;No upper extremity supported Sitting balance-Leahy Scale: Fair Sitting balance - Comments: Min guard for safety. Cues for hand support                                    Cognition Arousal/Alertness: Awake/alert Behavior During Therapy: Flat affect;Restless Overall Cognitive Status: Impaired/Different from baseline Area of Impairment: Orientation;Attention;Memory;Following commands;Safety/judgement;Awareness                 Orientation Level: Disoriented to;Place;Time;Situation Current Attention Level: Sustained Memory: Decreased short-term memory Following Commands: Follows one step commands inconsistently Safety/Judgement: Decreased awareness of safety;Decreased awareness of deficits Awareness: Intellectual   General Comments: Pt oriented to self only. More restless than yesterday      Exercises      General Comments General comments (skin integrity, edema, etc.): VSS      Pertinent Vitals/Pain Pain Assessment: Faces Faces Pain Scale: Hurts little more Pain Location: generalized with movement Pain Descriptors / Indicators: Grimacing Pain Intervention(s): Monitored during session    Home Living                      Prior Function            PT Goals (current goals can now be found in the care plan section) Acute Rehab PT Goals Patient Stated Goal: "get water." PT Goal Formulation: With patient Time For Goal Achievement: 10/13/18 Potential to Achieve Goals: Good Progress towards PT goals: Progressing toward goals    Frequency    Min  3X/week      PT Plan Current plan remains appropriate    Co-evaluation              AM-PAC PT "6 Clicks" Daily Activity  Outcome Measure  Difficulty turning over in bed (including adjusting bedclothes, sheets and blankets)?: Unable Difficulty moving from lying on back to sitting on the side  of the bed? : Unable Difficulty sitting down on and standing up from a chair with arms (e.g., wheelchair, bedside commode, etc,.)?: A Little Help needed moving to and from a bed to chair (including a wheelchair)?: A Lot Help needed walking in hospital room?: A Lot Help needed climbing 3-5 steps with a railing? : Total 6 Click Score: 10    End of Session Equipment Utilized During Treatment: Oxygen Activity Tolerance: Patient tolerated treatment well Patient left: with family/visitor present;in chair;with chair alarm set;with restraints reapplied Nurse Communication: Mobility status PT Visit Diagnosis: Other abnormalities of gait and mobility (R26.89)     Time: 1030-1109 PT Time Calculation (min) (ACUTE ONLY): 39 min  Charges:  $Therapeutic Activity: 38-52 mins                    Laurina Bustle, PT, DPT Acute Rehabilitation Services Pager [phone] Office [phone]   Vanetta Mulders 09/30/2018, 1:30 PM

## 2018-09-30 NOTE — Progress Notes (Addendum)
   Lab Value:  Potassium 3.1  Provider Notified: Mercy, RN took message for Dr. Violet Baldy.  Orders Received/Actions taken:Waiting for orders for replacement.

## 2018-09-30 NOTE — Plan of Care (Signed)
  Problem: Clinical Measurements: Goal: Ability to maintain clinical measurements within normal limits will improve Outcome: Progressing Goal: Diagnostic test results will improve Outcome: Progressing Goal: Respiratory complications will improve Outcome: Progressing Goal: Cardiovascular complication will be avoided Outcome: Progressing   Problem: Nutrition: Goal: Adequate nutrition will be maintained Outcome: Progressing   Problem: Elimination: Goal: Will not experience complications related to bowel motility Outcome: Progressing Goal: Will not experience complications related to urinary retention Outcome: Progressing   Problem: Health Behavior/Discharge Planning: Goal: Ability to manage health-related needs will improve Outcome: Not Progressing   Problem: Coping: Goal: Level of anxiety will decrease Outcome: Not Progressing  Patient continues to be agitated. Nurse has educated wife on providing a calm environment to allow rest for the patient. Nurse has had to reinforce this several times. Wife continues to turn on bright lights while patient is resting. Nurse will continue to educate wife on the importance of allowing for rest to decrease agitation.

## 2018-09-30 NOTE — Progress Notes (Signed)
eLink Physician-Brief Progress Note Patient Name: Billy Simon DOB: 21-Jul-1956 MRN: [id number]   Date of Service  09/30/2018  HPI/Events of Note  Patient continues to be agitated and unable to sleep despite the addition of Seroquel.  Wife claims he takes Xanax regularly at home.  eICU Interventions  Ordered a one time dose of Xanax 1 mg as needed for agitation and insomnia.     Intervention Category Major Interventions: Delirium, psychosis, severe agitation - evaluation and management  Darl Pikes 09/30/2018, 1:46 AM

## 2018-09-30 NOTE — Consult Note (Addendum)
[facility] Face-to-Face Psychiatry Consult   Reason for Consult:  "Severe anxiety, psychosis, post critical illness, for titration of meds" Referring Physician:  Dr. Chase Caller Patient Identification: Obed Samek MRN:  [id number] Principal Diagnosis: Delirium due to another medical condition Diagnosis:   Patient Active Problem List   Diagnosis Date Noted  . Pressure injury of skin [L89.90] 09/27/2018  . Encephalopathy acute [G93.40]   . CHB (complete heart block) ([place]) [I44.2] 09/14/2018  . Complete heart block ([place]) [I44.2] 09/14/2018    Total Time spent with patient: 1 hour  Subjective:   Aedan Geimer is a 62 y.o. male patient admitted with complete heart block.  HPI:   Per chart review, patient was admitted on 10/15 with complete heart block requiring a cath and temporary femoral pacer insertion on 10/15. He was intubated for respiratory failure with hypoxia, hypercarbia and HCAP with extensive bilateral infiltrates. He was extubated on 10/29. His hospital course has been complicated by delirium with agitation requiring Precedex drip. Home medications include Wellbutrin 100 mg TID, Buspar 15 mg daily, Celexa 40 mg daily and Risperdal 6 mg daily.  He was given Seroquel 100 mg overnight.   Of note, he was seen in 08/2014 at [place] in [facility]. He has a history of schizoaffective disorder with chronic poor focusing. He appeared stable during this follow up visit.   On interview, Mr. Correa is difficult to understand due to a low and raspy voice. He nods his head yes or no to most questions. He is only oriented to person and believes that he is in a church. He denies SI, HI or AVH. His wife and sister were at bedside and provided most of his history. He was doing well prior to hospitalization. He works as an Surveyor, quantity at a day program. He is medication compliant (although was not immediately restarted on his medications until his 4th day of hospitalization  according to family). He has not had medication changes for the past 4 years. Today, he has been endorsing delusional ideas and feels like he is dying. He had VH of rats in his room yesterday. They report that his mental status has been fluctuating but he appears to be gradually improving. His wife denies a history of manic symptoms (decreased need for sleep, increased energy, pressured speech or euphoria).   Past Psychiatric History: Schizoaffective disorder   Risk to Self:  None. Denies SI.  Risk to Others:  None. Denies HI.  Prior Inpatient Therapy:  He was hospitalized 30 years ago for psychosis. He received ECT 10 years ago.  Prior Outpatient Therapy:  His medications are managed by Dr. Celedonio Miyamoto.   Past Medical History:  Past Medical History:  Diagnosis Date  . CHB (complete heart block) ([place]) 09/14/2018  . Syncope 08/2018   Hospitalized at [facility]    Past Surgical History:  Procedure Laterality Date  . LEFT HEART CATH AND CORONARY ANGIOGRAPHY N/A 09/14/2018   Procedure: LEFT HEART CATH AND CORONARY ANGIOGRAPHY;  Surgeon: Martinique, Peter M, MD;  Location: [place] [facility];  Service: Cardiovascular;  Laterality: N/A;  . PACEMAKER IMPLANT N/A 09/16/2018   Procedure: PACEMAKER IMPLANT;  Surgeon: Constance Haw, MD;  Location: [place] [facility];  Service: Cardiovascular;  Laterality: N/A;  . TEMPORARY PACEMAKER N/A 09/14/2018   Procedure: TEMPORARY PACEMAKER;  Surgeon: Martinique, Peter M, MD;  Location: [place] [facility];  Service: Cardiovascular;  Laterality: N/A;   Family History: No family history on file. Family  Psychiatric  History: Denies  Social History:  Social History   Substance and Sexual Activity  Alcohol Use Not on file     Social History   Substance and Sexual Activity  Drug Use Not on file    Social History   Socioeconomic History  . Marital status: Married    Spouse name: Not on file  . Number of children: Not on file  . Years of  education: Not on file  . Highest education level: Not on file  Occupational History  . Not on file  Social Needs  . Financial resource strain: Not on file  . Food insecurity:    Worry: Not on file    Inability: Not on file  . Transportation needs:    Medical: Not on file    Non-medical: Not on file  Tobacco Use  . Smoking status: Never Smoker  . Smokeless tobacco: Never Used  Substance and Sexual Activity  . Alcohol use: Not on file  . Drug use: Not on file  . Sexual activity: Not on file  Lifestyle  . Physical activity:    Days per week: Not on file    Minutes per session: Not on file  . Stress: Not on file  Relationships  . Social connections:    Talks on phone: Not on file    Gets together: Not on file    Attends religious service: Not on file    Active member of club or organization: Not on file    Attends meetings of clubs or organizations: Not on file    Relationship status: Not on file  Other Topics Concern  . Not on file  Social History Narrative  . Not on file   Additional Social History: he lives at home with his wife and adult daughter. He also has a son who does not live at home. He does not use alcohol or illicit drugs.     Allergies:  No Known Allergies  Labs:  Results for orders placed or performed during the hospital encounter of 09/14/18 (from the past 48 hour(s))  Glucose, capillary     Status: None   Collection Time: 09/28/18  4:24 PM  Result Value Ref Range   Glucose-Capillary 87 70 - 99 mg/dL   Comment 1 Notify RN   Glucose, capillary     Status: None   Collection Time: 09/28/18  8:37 PM  Result Value Ref Range   Glucose-Capillary 93 70 - 99 mg/dL   Comment 1 Capillary Specimen   Glucose, capillary     Status: None   Collection Time: 09/29/18 12:21 AM  Result Value Ref Range   Glucose-Capillary 82 70 - 99 mg/dL   Comment 1 Capillary Specimen   Glucose, capillary     Status: None   Collection Time: 09/29/18  3:25 AM  Result Value Ref  Range   Glucose-Capillary 86 70 - 99 mg/dL   Comment 1 Capillary Specimen   Basic metabolic panel     Status: Abnormal   Collection Time: 09/29/18  7:32 AM  Result Value Ref Range   Sodium 146 (H) 135 - 145 mmol/L   Potassium 5.4 (H) 3.5 - 5.1 mmol/L    Comment: SPECIMEN HEMOLYZED. HEMOLYSIS MAY AFFECT INTEGRITY OF RESULTS.   Chloride 120 (H) 98 - 111 mmol/L   CO2 19 (L) 22 - 32 mmol/L   Glucose, Bld 94 70 - 99 mg/dL   BUN 29 (H) 8 - 23 mg/dL   Creatinine, Ser 0.93  0.61 - 1.24 mg/dL   Calcium 8.1 (L) 8.9 - 10.3 mg/dL   GFR calc non Af Amer >60 >60 mL/min   GFR calc Af Amer >60 >60 mL/min    Comment: (NOTE) The eGFR has been calculated using the CKD EPI equation. This calculation has not been validated in all clinical situations. eGFR's persistently <60 mL/min signify possible Chronic Kidney Disease.    Anion gap 7 5 - 15    Comment: Performed at [facility] [address] [place], [place] [address]  CBC     Status: Abnormal   Collection Time: 09/29/18  7:32 AM  Result Value Ref Range   WBC 12.9 (H) 4.0 - 10.5 K/uL   RBC 3.76 (L) 4.22 - 5.81 MIL/uL   Hemoglobin 10.9 (L) 13.0 - 17.0 g/dL   HCT 35.9 (L) 39.0 - 52.0 %   MCV 95.5 80.0 - 100.0 fL   MCH 29.0 26.0 - 34.0 pg   MCHC 30.4 30.0 - 36.0 g/dL   RDW 16.4 (H) 11.5 - 15.5 %   Platelets 381 150 - 400 K/uL   nRBC 0.0 0.0 - 0.2 %    Comment: Performed at [facility], [place] [address] [place], [place] [address]  Magnesium     Status: Abnormal   Collection Time: 09/29/18  7:32 AM  Result Value Ref Range   Magnesium 2.5 (H) 1.7 - 2.4 mg/dL    Comment: Performed at [facility] [address] [place], [place] [address]  Phosphorus     Status: None   Collection Time: 09/29/18  7:32 AM  Result Value Ref Range   Phosphorus 3.9 2.5 - 4.6 mg/dL    Comment: Performed at [facility] [address], [place], [place] [address]  Glucose, capillary     Status: None   Collection Time: 09/29/18  8:28 AM   Result Value Ref Range   Glucose-Capillary 94 70 - 99 mg/dL   Comment 1 Capillary Specimen    Comment 2 Notify RN   Glucose, capillary     Status: None   Collection Time: 09/29/18 11:37 AM  Result Value Ref Range   Glucose-Capillary 85 70 - 99 mg/dL  Glucose, capillary     Status: None   Collection Time: 09/29/18  4:21 PM  Result Value Ref Range   Glucose-Capillary 87 70 - 99 mg/dL   Comment 1 Capillary Specimen    Comment 2 Notify RN   Glucose, capillary     Status: None   Collection Time: 09/29/18  9:16 PM  Result Value Ref Range   Glucose-Capillary 99 70 - 99 mg/dL   Comment 1 Notify RN   Glucose, capillary     Status: Abnormal   Collection Time: 09/30/18 12:54 AM  Result Value Ref Range   Glucose-Capillary 100 (H) 70 - 99 mg/dL   Comment 1 Notify RN   Basic metabolic panel     Status: Abnormal   Collection Time: 09/30/18  2:39 AM  Result Value Ref Range   Sodium 146 (H) 135 - 145 mmol/L   Potassium 3.1 (L) 3.5 - 5.1 mmol/L    Comment: DELTA CHECK NOTED   Chloride 117 (H) 98 - 111 mmol/L   CO2 21 (L) 22 - 32 mmol/L   Glucose, Bld 107 (H) 70 - 99 mg/dL   BUN 26 (H) 8 - 23 mg/dL   Creatinine, Ser 0.95 0.61 - 1.24 mg/dL   Calcium 7.9 (L) 8.9 -  10.3 mg/dL   GFR calc non Af Amer >60 >60 mL/min   GFR calc Af Amer >60 >60 mL/min    Comment: (NOTE) The eGFR has been calculated using the CKD EPI equation. This calculation has not been validated in all clinical situations. eGFR's persistently <60 mL/min signify possible Chronic Kidney Disease.    Anion gap 8 5 - 15    Comment: Performed at [facility] [address] [place], [place] [address]  Magnesium     Status: None   Collection Time: 09/30/18  2:39 AM  Result Value Ref Range   Magnesium 2.3 1.7 - 2.4 mg/dL    Comment: Performed at [facility] [address], [place], [place] [address]  Phosphorus     Status: None   Collection Time: 09/30/18  2:39 AM  Result Value Ref Range   Phosphorus 3.3 2.5 -  4.6 mg/dL    Comment: Performed at [facility] [address], [place], [place] [address]  CBC     Status: Abnormal   Collection Time: 09/30/18  2:39 AM  Result Value Ref Range   WBC 15.5 (H) 4.0 - 10.5 K/uL   RBC 3.44 (L) 4.22 - 5.81 MIL/uL   Hemoglobin 9.6 (L) 13.0 - 17.0 g/dL   HCT 31.7 (L) 39.0 - 52.0 %   MCV 92.2 80.0 - 100.0 fL   MCH 27.9 26.0 - 34.0 pg   MCHC 30.3 30.0 - 36.0 g/dL   RDW 15.7 (H) 11.5 - 15.5 %   Platelets 510 (H) 150 - 400 K/uL   nRBC 0.0 0.0 - 0.2 %    Comment: Performed at [facility] [address], [place], [place] [address]  Glucose, capillary     Status: Abnormal   Collection Time: 09/30/18  4:03 AM  Result Value Ref Range   Glucose-Capillary 113 (H) 70 - 99 mg/dL   Comment 1 Capillary Specimen    Comment 2 Notify RN   Glucose, capillary     Status: Abnormal   Collection Time: 09/30/18  8:18 AM  Result Value Ref Range   Glucose-Capillary 105 (H) 70 - 99 mg/dL   Comment 1 Capillary Specimen   Glucose, capillary     Status: Abnormal   Collection Time: 09/30/18 11:08 AM  Result Value Ref Range   Glucose-Capillary 108 (H) 70 - 99 mg/dL   Comment 1 Capillary Specimen    Comment 2 Notify RN     Current Facility-Administered Medications  Medication Dose Route Frequency Provider Last Rate Last Dose  . 0.9 %  sodium chloride infusion  250 mL Intravenous PRN Martinique, Peter M, MD      . 0.9 %  sodium chloride infusion  250 mL Intravenous PRN Barrett, Rhonda G, PA-C 10 mL/hr at 09/30/18 0900    . ALPRAZolam Duanne Moron) tablet 0.5 mg  0.5 mg Oral BID PRN Rigoberto Noel, MD      . benztropine (COGENTIN) tablet 1 mg  1 mg Oral Daily Aroor, Lanice Schwab, MD   1 mg at 09/30/18 0918  . bisacodyl (DULCOLAX) EC tablet 5 mg  5 mg Oral Daily PRN Brand Males, MD      . bisacodyl (DULCOLAX) suppository 10 mg  10 mg Rectal Daily PRN Brand Males, MD   10 mg at 09/29/18 1840  . buPROPion [facility]) tablet 100 mg  100 mg Oral TID Martinique, Peter M, MD    100 mg at 09/30/18 0755  .  busPIRone (BUSPAR) tablet 15 mg  15 mg Oral BID Brand Males, MD   15 mg at 09/30/18 0918  . ceFEPIme (MAXIPIME) 2 g in sodium chloride 0.9 % 100 mL IVPB  2 g Intravenous Q8H Rigoberto Noel, MD   Stopped at 09/30/18 0551  . chlorhexidine gluconate (MEDLINE KIT) (PERIDEX) 0.12 % solution 15 mL  15 mL Mouth Rinse BID Martinique, Peter M, MD   15 mL at 09/30/18 0744  . citalopram (CELEXA) tablet 40 mg  40 mg Oral Daily Brand Males, MD   40 mg at 09/30/18 0918  . dexmedetomidine (PRECEDEX) 400 MCG/100ML (4 mcg/mL) infusion  0.4-1.2 mcg/kg/hr Intravenous Titrated Desai, Rahul P, PA-C 25.1 mL/hr at 09/30/18 0936 1 mcg/kg/hr at 09/30/18 0936  . feeding supplement (VITAL AF 1.2 CAL) liquid 1,000 mL  1,000 mL Per Tube Continuous Brand Males, MD 80 mL/hr at 09/29/18 2000    . free water 200 mL  200 mL Per Tube Q4H Kara Mead V, MD   200 mL at 09/30/18 0744  . haloperidol lactate (HALDOL) injection 1-4 mg  1-4 mg Intravenous Q3H PRN Rigoberto Noel, MD   4 mg at 09/29/18 2239  . heparin injection 5,000 Units  5,000 Units Subcutaneous Q8H Martinique, Peter M, MD   5,000 Units at 09/30/18 [phone]  . MEDLINE mouth rinse  15 mL Mouth Rinse 10 times per day Martinique, Peter M, MD   15 mL at 09/30/18 5638  . ondansetron (ZOFRAN) injection 4 mg  4 mg Intravenous Q6H PRN Barrett, Rhonda G, PA-C      . pantoprazole sodium (PROTONIX) 40 mg/20 mL oral suspension 40 mg  40 mg Per Tube Q1200 Rigoberto Noel, MD   40 mg at 09/29/18 1401  . polyethylene glycol (MIRALAX / GLYCOLAX) packet 17 g  17 g Oral Daily Minor, Grace Bushy, NP   17 g at 09/29/18 1401  . potassium chloride 20 MEQ/15ML (10%) solution 20 mEq  20 mEq Per Tube Once Rigoberto Noel, MD      . risperiDONE (RISPERDAL) 1 MG/ML oral solution 6 mg  6 mg Per Tube Daily Brand Males, MD   6 mg at 09/30/18 7564    Musculoskeletal: Strength & Muscle Tone: Generalized weakness. Gait & Station: unable to stand Patient leans:  N/A  Psychiatric Specialty Exam: Physical Exam  Nursing note and vitals reviewed. Constitutional: He appears well-developed and well-nourished.  HENT:  Head: Normocephalic and atraumatic.  Neck: Normal range of motion.  Respiratory: Effort normal.  Musculoskeletal: Normal range of motion.  Neurological: He is alert.  Only oriented to self.   Psychiatric: He has a normal mood and affect. His behavior is normal. Judgment and thought content normal. His speech is slurred. Cognition and memory are impaired.    Review of Systems  Psychiatric/Behavioral: Negative for hallucinations, substance abuse and suicidal ideas. The patient does not have insomnia.   All other systems reviewed and are negative.   Blood pressure 104/62, pulse 65, temperature 97.6 F (36.4 C), temperature source Oral, resp. rate (!) 21, height 6' (1.829 m), weight 92.2 kg, SpO2 99 %.Body mass index is 27.57 kg/m.  General Appearance: Fairly Groomed, middle aged, Caucasian male, wearing a hospital gown with a feeding tube and nasal cannula in place who is lying in bed. NAD.   Eye Contact:  Good  Speech:  Slow and Slurred and raspy.   Volume:  Decreased  Mood:  Euthymic  Affect:  Constricted  Thought Process:  Linear  and Descriptions of Associations: Intact  Orientation:  Other:  Only oriented to self.  Thought Content:  Logical  Suicidal Thoughts:  No  Homicidal Thoughts:  No  Memory:  Immediate;   Poor Recent;   Fair Remote;   Fair  Judgement:  Poor  Insight:  Shallow  Psychomotor Activity:  Decreased  Concentration:  Concentration: Fair and Attention Span: Fair  Recall:  Poor  Fund of Knowledge:  Fair  Language:  Good  Akathisia:  No  Handed:  Right  AIMS (if indicated):   N/A  Assets:  Financial Resources/Insurance Housing Intimacy Social Support  ADL's:  Impaired  Cognition:  Impaired with short term memory deficits.   Sleep:   Fair   Assessment:  Clara Smolen is a 62 y.o. male who was admitted  with complete heart block requiring a cath and temporary femoral pacer insertion on 10/15. He was intubated for respiratory failure with hypoxia, hypercarbia and HCAP with extensive bilateral infiltrates. He was extubated on 10/29. Family reports that he was doing well prior to hospitalization. The acute change in mental status with fluctuating periods of confusion is most consistent with delirium secondary to medical condition. He is only oriented to person today. He denies SI, HI or AVH at this time. Recommend continuing current medication regimen and use low dose Haldol as needed for worsening agitation.   Treatment Plan Summary: Continue home medications: Wellbutrin 100 mg TID for depression, Celexa 40 mg daily for depression and anxiety, Cogentin 1 mg daily for EPS prophylaxis and Risperdal 6 mg daily for psychosis.   -Reduce Buspar 15 mg BID to 15 mg daily (home dose per wife) for mood augmentation/anxiety.  -Continue Xanax 0.5 mg BID PRN for anxiety. -Continue Haldol 2-5 mg q 6 hours PRN for agitation.  -EKG reviewed and QTc 526 on 10/27. Please closely monitor when starting or increasing QTc prolonging agents.  -Patient should follow up with his outpatient provider for further medication management.  -Psychiatry will sign off on patient at this time. Please consult psychiatry again as needed.    Disposition: No evidence of imminent risk to self or others at present.   Patient does not meet criteria for psychiatric inpatient admission.  Faythe Dingwall, DO 09/30/2018 11:41 AM

## 2018-10-01 ENCOUNTER — Inpatient Hospital Stay (HOSPITAL_COMMUNITY): Payer: PRIVATE HEALTH INSURANCE

## 2018-10-01 DIAGNOSIS — F05 Delirium due to known physiological condition: Secondary | ICD-10-CM

## 2018-10-01 LAB — MAGNESIUM: Magnesium: 2.2 mg/dL (ref 1.7–2.4)

## 2018-10-01 LAB — GLUCOSE, CAPILLARY
GLUCOSE-CAPILLARY: 108 mg/dL — AB (ref 70–99)
GLUCOSE-CAPILLARY: 94 mg/dL (ref 70–99)
GLUCOSE-CAPILLARY: 97 mg/dL (ref 70–99)
GLUCOSE-CAPILLARY: 97 mg/dL (ref 70–99)
Glucose-Capillary: 96 mg/dL (ref 70–99)
Glucose-Capillary: 83 mg/dL (ref 70–99)

## 2018-10-01 LAB — CULTURE, BLOOD (ROUTINE X 2)
Culture: NO GROWTH
Culture: NO GROWTH
SPECIAL REQUESTS: ADEQUATE
Special Requests: ADEQUATE

## 2018-10-01 LAB — CBC
HEMATOCRIT: 33.1 % — AB (ref 39.0–52.0)
HEMOGLOBIN: 10.2 g/dL — AB (ref 13.0–17.0)
MCH: 28.3 pg (ref 26.0–34.0)
MCHC: 30.8 g/dL (ref 30.0–36.0)
MCV: 91.9 fL (ref 80.0–100.0)
NRBC: 0 % (ref 0.0–0.2)
Platelets: 450 10*3/uL — ABNORMAL HIGH (ref 150–400)
RBC: 3.6 MIL/uL — ABNORMAL LOW (ref 4.22–5.81)
RDW: 15.9 % — AB (ref 11.5–15.5)
WBC: 14.9 10*3/uL — AB (ref 4.0–10.5)

## 2018-10-01 LAB — BASIC METABOLIC PANEL
ANION GAP: 6 (ref 5–15)
BUN: 20 mg/dL (ref 8–23)
CALCIUM: 8.1 mg/dL — AB (ref 8.9–10.3)
CO2: 22 mmol/L (ref 22–32)
Chloride: 116 mmol/L — ABNORMAL HIGH (ref 98–111)
Creatinine, Ser: 0.9 mg/dL (ref 0.61–1.24)
GFR calc non Af Amer: >60 mL/min (ref >60)
GLUCOSE: 100 mg/dL — AB (ref 70–99)
Potassium: 3.7 mmol/L (ref 3.5–5.1)
Sodium: 144 mmol/L (ref 135–145)

## 2018-10-01 LAB — PHOSPHORUS: Phosphorus: 3.4 mg/dL (ref 2.5–4.6)

## 2018-10-01 NOTE — Progress Notes (Addendum)
Contacted [facility] due to discrepancy in Cortrak length. Left message for Dr. Kristen Cardinal.   2000 Order received to hold tube feedings until placement verified. KUB ordered. See orders.

## 2018-10-01 NOTE — Progress Notes (Signed)
Inpatient Rehabilitation-Admissions Coordinator    Met with patient and his wife at the bedside to discuss team's recommendation for inpatient rehabilitation. Shared booklets, expectations while in CIR, expected length of stay, and anticipated functional level at DC. Pt would defer questions to his wife during conversation and did express concern over intensity of the program; [facility] reassured pt he would be able to participate.   Pt's wife wanting to have more information about cost of CIR. AC returned later in the day to discuss the pt's insurance benefits. With current plan, his policy does not cover therapies; room and board may be covered depending on coding. After explaining benefits, pt's wife discussed possibility of self pay but wanted to talk this over with her family.  AC will follow up with billing on Monday to discuss uncertainties of coding as well as follow up with family for final decision regarding post acute rehab.   Please call if questions.   Jhonnie Garner, OTR/L  Rehab Admissions Coordinator  [phone] 10/01/2018 6:21 PM

## 2018-10-01 NOTE — Consult Note (Signed)
Physical Medicine and Rehabilitation Consult Reason for Consult: Decreased functional mobility Referring Physician: Dr. Vassie Loll   HPI: Billy Simon is a 62 y.o. right-handed male with history of schizophrenia, syncope and hospitalized recently at [facility] as well as history of complete heart block followed by cardiology services.  Per chart review patient lives with spouse.  Independent prior to admission one level home with 4 steps to entry.  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  Presented 09/14/2018 with noted syncopal spell several weeks ago.  He underwent 2-week event monitor echocardiogram which by report was unremarkable.  Patient found morning of admission difficult to arouse with agonal breathing.  EMS arrived patient noted heart block underwent transcutaneous pacing was transferred to [facility].  In the ED he was agitated and underwent intubation for airway protection.  Catheterization demonstrated no CAD and normal LVEF and a temporary wire was placed.  Cranial CT scan showed no acute changes 09/16/2018 and again repeated 09/23/2018 being negative.  Patient with persistent fevers maintain on broad-spectrum antibiotics with chest x-ray showing right-sided infiltrates.  EEG showed mild slowing without seizure.  He did require Precedex for agitation.  Patient extubated 09/28/2018.  Follow-up cardiology services a permanent pacemaker has since been placed.  Psychiatry services was consulted 09/30/2018 for delirium with noted history of schizophrenia and currently remains on Xanax, Cogentin, Wellbutrin, BuSpar and Celexa.  MRSA PCR screening positive with contact precautions.  Subcutaneous heparin for DVT prophylaxis.  Nasogastric tube remains in place for nutritional support.  Occupational therapy evaluation completed with recommendations of physical medicine rehab consult.   Review of Systems  Unable to perform ROS: Acuity of condition   Past  Medical History:  Diagnosis Date  . CHB (complete heart block) (HCC) 09/14/2018  . Syncope 08/2018   Hospitalized at [facility]   Past Surgical History:  Procedure Laterality Date  . LEFT HEART CATH AND CORONARY ANGIOGRAPHY N/A 09/14/2018   Procedure: LEFT HEART CATH AND CORONARY ANGIOGRAPHY;  Surgeon: Swaziland, Peter M, MD;  Location: [facility] INVASIVE CV LAB;  Service: Cardiovascular;  Laterality: N/A;  . PACEMAKER IMPLANT N/A 09/16/2018   Procedure: PACEMAKER IMPLANT;  Surgeon: Regan Lemming, MD;  Location: [facility] INVASIVE CV LAB;  Service: Cardiovascular;  Laterality: N/A;  . TEMPORARY PACEMAKER N/A 09/14/2018   Procedure: TEMPORARY PACEMAKER;  Surgeon: Swaziland, Peter M, MD;  Location: [facility] INVASIVE CV LAB;  Service: Cardiovascular;  Laterality: N/A;   No family history on file. Social History:  reports that he has never smoked. He has never used smokeless tobacco. His alcohol and drug histories are not on file. Allergies: No Known Allergies Medications Prior to Admission  Medication Sig Dispense Refill  . ALPRAZolam (XANAX) 1 MG tablet Take 1 mg by mouth daily as needed for anxiety.    . benztropine (COGENTIN) 1 MG tablet Take 1 mg by mouth daily.    Marland Kitchen buPROPion (WELLBUTRIN XL) 300 MG 24 hr tablet Take 300 mg by mouth daily.    . busPIRone (BUSPAR) 15 MG tablet Take 15 mg by mouth 2 (two) times daily.    . citalopram (CELEXA) 40 MG tablet Take 40 mg by mouth daily.    . risperiDONE (RISPERDAL) 2 MG tablet Take 6 mg by mouth daily.       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 Functional Status:  Mobility: Bed Mobility Overal bed mobility: Needs Assistance Bed Mobility: Supine to Sit Rolling: Min assist Sidelying to sit: Max assist Supine to sit: Min assist Sit to supine: Min assist General bed mobility comments: cues for technique, use of rail, min assist to raise trunk Transfers Overall transfer level: Needs assistance Equipment used: Ambulation equipment used Transfer via Lift Equipment: Stedy Transfers: Sit to/from Stand Sit to Stand: +2 physical assistance, Max assist, Mod assist Squat pivot transfers: Max assist, +2 physical assistance General transfer comment: assist to rise and tuck bottom, +2 mod from bed, +2 max from [facility]      ADL: ADL Overall ADL's : Needs assistance/impaired Eating/Feeding: NPO Grooming: Sitting, Total assistance, Brushing hair Upper Body Bathing: Maximal assistance, Sitting Lower Body Bathing: Total assistance, +2 for physical assistance, Sit to/from stand Upper Body Dressing : Maximal assistance, Sitting Lower Body Dressing: +2 for physical assistance, Total assistance, Sit to/from stand Toilet Transfer: Total assistance, +2 for physical assistance, BSC(with sara stedy) Toileting- Clothing Manipulation and Hygiene: +2 for physical assistance, Total assistance, Sit to/from stand Functional mobility during ADLs: (unable to ambulate)  Cognition: Cognition Overall Cognitive Status: Impaired/Different from baseline Orientation Level: Oriented to person, Disoriented to time, Disoriented to situation, Disoriented to place Cognition Arousal/Alertness: Awake/alert Behavior During Therapy: Flat affect, Anxious, Impulsive Overall Cognitive Status: Impaired/Different from baseline Area of Impairment: Orientation, Attention, Memory, Following commands, Safety/judgement, Awareness Orientation Level: Disoriented to, Place, Time,  Situation Current Attention Level: Sustained Memory: Decreased short-term memory Following Commands: Follows one step commands with increased time, Follows one step commands inconsistently Safety/Judgement: Decreased awareness of safety, Decreased awareness of deficits Awareness: Intellectual General Comments: Pt oriented to self only. More restless than yesterday  Blood pressure 116/64, pulse 79, temperature 98.1 F (36.7 C), temperature source Oral, resp. rate (!) 26, height 6' (1.829 m), weight 92.8 kg, SpO2 99 %. Physical Exam  HENT:  Head: Normocephalic.  Neck: Normal range of motion.  Cardiovascular: Normal rate.  GI: Soft.  Neurological:  Patient is lethargic.  He does make eye contact.  He was able to use yes no head nods and some simple words to communicate.  He did follow simple commands    Results for orders placed or performed during the hospital encounter of 09/14/18 (from the past 24 hour(s))  Glucose, capillary     Status: Abnormal   Collection Time: 09/30/18  4:12 PM  Result Value Ref Range   Glucose-Capillary 105 (H) 70 - 99 mg/dL  Glucose, capillary     Status: None   Collection Time: 09/30/18  8:49 PM  Result Value Ref Range   Glucose-Capillary 96 70 - 99 mg/dL   Comment 1 Notify RN   Glucose, capillary     Status: None   Collection Time: 10/01/18 12:04 AM  Result Value Ref Range   Glucose-Capillary 94 70 - 99 mg/dL   Comment 1 Capillary Specimen   CBC     Status: Abnormal   Collection Time: 10/01/18  2:53 AM  Result Value Ref Range   WBC 14.9 (H) 4.0 - 10.5 K/uL   RBC 3.60 (L) 4.22 - 5.81 MIL/uL   Hemoglobin 10.2 (L) 13.0 - 17.0 g/dL   HCT 16.1 (L) 09.6 -  52.0 %   MCV 91.9 80.0 - 100.0 fL   MCH 28.3 26.0 - 34.0 pg   MCHC 30.8 30.0 - 36.0 g/dL   RDW 16.1 (H) 09.6 - 04.5 %   Platelets 450 (H) 150 - 400 K/uL   nRBC 0.0 0.0 - 0.2 %  Basic metabolic panel     Status: Abnormal   Collection Time: 10/01/18  2:53 AM  Result Value Ref Range   Sodium 144  135 - 145 mmol/L   Potassium 3.7 3.5 - 5.1 mmol/L   Chloride 116 (H) 98 - 111 mmol/L   CO2 22 22 - 32 mmol/L   Glucose, Bld 100 (H) 70 - 99 mg/dL   BUN 20 8 - 23 mg/dL   Creatinine, Ser 4.09 0.61 - 1.24 mg/dL   Calcium 8.1 (L) 8.9 - 10.3 mg/dL   GFR calc non Af Amer >60 >60 mL/min   GFR calc Af Amer >60 >60 mL/min   Anion gap 6 5 - 15  Magnesium     Status: None   Collection Time: 10/01/18  2:53 AM  Result Value Ref Range   Magnesium 2.2 1.7 - 2.4 mg/dL  Phosphorus     Status: None   Collection Time: 10/01/18  2:53 AM  Result Value Ref Range   Phosphorus 3.4 2.5 - 4.6 mg/dL  Glucose, capillary     Status: None   Collection Time: 10/01/18  4:32 AM  Result Value Ref Range   Glucose-Capillary 96 70 - 99 mg/dL   Comment 1 Capillary Specimen   Glucose, capillary     Status: None   Collection Time: 10/01/18  9:09 AM  Result Value Ref Range   Glucose-Capillary 97 70 - 99 mg/dL   Comment 1 Capillary Specimen    Dg Chest Port 1 View  Result Date: 09/30/2018 CLINICAL DATA:  Respiratory failure EXAM: PORTABLE CHEST 1 VIEW COMPARISON:  09/29/2018 FINDINGS: Cardiac shadow is stable. Pacing device is again seen and stable. Feeding catheter is noted coursing into the stomach. Diffuse infiltrate is again identified throughout the left lung similar to that seen on the prior exam. Minimal changes on the right are seen mildly improved from the prior study. No bony abnormality is seen. IMPRESSION: Improved aeration on the right. Stable changes in the left lung are noted. Electronically Signed   By: Alcide Clever M.D.   On: 09/30/2018 08:41   Dg Swallowing Func-speech Pathology  Result Date: 09/29/2018 Objective Swallowing Evaluation: Type of Study: MBS-Modified Barium Swallow Study  Patient Details Name: Billy Simon MRN: [id number] Date of Birth: [birth year] Today's Date: 09/29/2018 Time: SLP Start Time (ACUTE ONLY): 1245 -SLP Stop Time (ACUTE ONLY): 1303 SLP Time Calculation (min) (ACUTE ONLY): 18  min Past Medical History: Past Medical History: Diagnosis Date . CHB (complete heart block) (HCC) 09/14/2018 . Syncope 08/2018  Hospitalized at [facility] Past Surgical History: Past Surgical History: Procedure Laterality Date . LEFT HEART CATH AND CORONARY ANGIOGRAPHY N/A 09/14/2018  Procedure: LEFT HEART CATH AND CORONARY ANGIOGRAPHY;  Surgeon: Swaziland, Peter M, MD;  Location: [facility] INVASIVE CV LAB;  Service: Cardiovascular;  Laterality: N/A; . PACEMAKER IMPLANT N/A 09/16/2018  Procedure: PACEMAKER IMPLANT;  Surgeon: Regan Lemming, MD;  Location: [facility] INVASIVE CV LAB;  Service: Cardiovascular;  Laterality: N/A; . TEMPORARY PACEMAKER N/A 09/14/2018  Procedure: TEMPORARY PACEMAKER;  Surgeon: Swaziland, Peter M, MD;  Location: [facility] INVASIVE CV LAB;  Service: Cardiovascular;  Laterality: N/A; HPI: 62 year old with a history of syncope who presented  with complete heart block requiring intubation 10/15-10/16, reintubated 10/17-10/29 for pacemaker insertion. Pt hypoxic hypercarbic and acidotic in cath labl for right femoral temporary pacing wire placement. Exhibited severe schizoaffective disorder, severe agitation, acute encephalopathy. CT head 10/24 > nil acute, mastoid effusion+, age advanced cerebral atrophy +  No data recorded Assessment / Plan / Recommendation CHL IP CLINICAL IMPRESSIONS 09/29/2018 Clinical Impression Pt exhibits severe pharyngeal, mild oral dysphagia, evidenced by gross aspiration as well as instances of deep penetration of thickened liquids due to decreased airway closure/protection. Large nectar thick bolus was aspirated during the swallow with strong reflexive cough and additional trial penetrated to the vocal folds without sensation due to markedly decreased epiglottic inversion. Honey and puree were also penetrated to the vocal folds without sensation with honey thick silently aspirated after the swallow. In addition, honey thick liquids produced mild lingual, mild-mod vallecular, and  pyriform sinsus residue. Pt's pharyngeal edema (from prolonged intubation) and reduced airway closure due to insufficient epiglottic inversion currenly pose a severe aspiration risk for POs of all textures. Thus, recommend pt continue NPO with alternative means for nutrition, and ST will continue to follow to provide treatment (pharyngeal exercises, respiratory muscle strength trainng) with PO trials and repeat MBSS without Cortrak placed when determined ready. SLP Visit Diagnosis Dysphagia, oropharyngeal phase (R13.12) Attention and concentration deficit following -- Frontal lobe and executive function deficit following -- Impact on safety and function Severe aspiration risk   CHL IP TREATMENT RECOMMENDATION 09/29/2018 Treatment Recommendations Therapy as outlined in treatment plan below   Prognosis 09/29/2018 Prognosis for Safe Diet Advancement Good Barriers to Reach Goals Behavior;Severity of deficits Barriers/Prognosis Comment -- CHL IP DIET RECOMMENDATION 09/29/2018 SLP Diet Recommendations NPO;Alternative means - temporary Liquid Administration via -- Medication Administration -- Compensations -- Postural Changes --   CHL IP OTHER RECOMMENDATIONS 09/29/2018 Recommended Consults -- Oral Care Recommendations Oral care QID Other Recommendations --   CHL IP FOLLOW UP RECOMMENDATIONS 09/29/2018 Follow up Recommendations Inpatient Rehab   CHL IP FREQUENCY AND DURATION 09/29/2018 Speech Therapy Frequency (ACUTE ONLY) min 2x/week Treatment Duration 2 weeks      CHL IP ORAL PHASE 09/29/2018 Oral Phase Impaired Oral - Pudding Teaspoon -- Oral - Pudding Cup -- Oral - Honey Teaspoon -- Oral - Honey Cup Lingual pumping;Lingual/palatal residue Oral - Nectar Teaspoon -- Oral - Nectar Cup Lingual/palatal residue;Lingual pumping Oral - Nectar Straw -- Oral - Thin Teaspoon -- Oral - Thin Cup -- Oral - Thin Straw -- Oral - Puree Lingual pumping Oral - Mech Soft -- Oral - Regular -- Oral - Multi-Consistency -- Oral - Pill -- Oral  Phase - Comment --  CHL IP PHARYNGEAL PHASE 09/29/2018 Pharyngeal Phase Impaired Pharyngeal- Pudding Teaspoon -- Pharyngeal -- Pharyngeal- Pudding Cup -- Pharyngeal -- Pharyngeal- Honey Teaspoon -- Pharyngeal -- Pharyngeal- Honey Cup Significant aspiration (Amount);Penetration/Aspiration during swallow;Penetration/Apiration after swallow;Reduced epiglottic inversion;Reduced airway/laryngeal closure;Pharyngeal residue - valleculae;Pharyngeal residue - pyriform Pharyngeal Material enters airway, passes BELOW cords without attempt by patient to eject out (silent aspiration);Material enters airway, passes BELOW cords and not ejected out despite cough attempt by patient;Material enters airway, remains ABOVE vocal cords then ejected out Pharyngeal- Nectar Teaspoon -- Pharyngeal -- Pharyngeal- Nectar Cup Penetration/Aspiration during swallow;Reduced airway/laryngeal closure;Reduced epiglottic inversion Pharyngeal Material enters airway, CONTACTS cords and not ejected out Pharyngeal- Nectar Straw -- Pharyngeal -- Pharyngeal- Thin Teaspoon -- Pharyngeal -- Pharyngeal- Thin Cup -- Pharyngeal -- Pharyngeal- Thin Straw -- Pharyngeal -- Pharyngeal- Puree Reduced epiglottic inversion;Reduced airway/laryngeal closure;Penetration/Aspiration during swallow Pharyngeal Material enters  airway, CONTACTS cords and not ejected out Pharyngeal- Mechanical Soft -- Pharyngeal -- Pharyngeal- Regular -- Pharyngeal -- Pharyngeal- Multi-consistency -- Pharyngeal -- Pharyngeal- Pill -- Pharyngeal -- Pharyngeal Comment --  CHL IP CERVICAL ESOPHAGEAL PHASE 09/29/2018 Cervical Esophageal Phase WFL Pudding Teaspoon -- Pudding Cup -- Honey Teaspoon -- Honey Cup -- Nectar Teaspoon -- Nectar Cup -- Nectar Straw -- Thin Teaspoon -- Thin Cup -- Thin Straw -- Puree -- Mechanical Soft -- Regular -- Multi-consistency -- Pill -- Cervical Esophageal Comment -- [name] 09/29/2018, 2:07 PM Breck Coons Litaker M.Ed Sports administrator  Pager [phone] Office [phone]                Assessment/Plan: Diagnosis: Debility/encephalopathy related to multiple medical conditions 1. Does the need for close, 24 hr/day medical supervision in concert with the patient's rehab needs make it unreasonable for this patient to be served in a less intensive setting? Yes 2. Co-Morbidities requiring supervision/potential complications: pneumonia/respiratory failure, schizophrenia 3. Due to bladder management, bowel management, safety, skin/wound care, disease management, medication administration, pain management and patient education, does the patient require 24 hr/day rehab nursing? Yes 4. Does the patient require coordinated care of a physician, rehab nurse, PT (1-2 hrs/day, 5 days/week), OT (1-2 hrs/day, 5 days/week) and SLP (1-2 hrs/day, 5 days/week) to address physical and functional deficits in the context of the above medical diagnosis(es)? Yes Addressing deficits in the following areas: balance, endurance, locomotion, strength, transferring, bowel/bladder control, bathing, dressing, feeding, grooming, toileting, cognition and psychosocial support 5. Can the patient actively participate in an intensive therapy program of at least 3 hrs of therapy per day at least 5 days per week? Yes, eventually 6. The potential for patient to make measurable gains while on inpatient rehab is good 7. Anticipated functional outcomes upon discharge from inpatient rehab are supervision  with PT, supervision with OT, supervision with SLP. 8. Estimated rehab length of stay to reach the above functional goals is: 13-18 days 9. Anticipated D/C setting: Home 10. Anticipated post D/C treatments: HH therapy and Outpatient therapy 11. Overall Rehab/Functional Prognosis: excellent  RECOMMENDATIONS: This patient's condition is appropriate for continued rehabilitative care in the following setting: CIR Patient has agreed to participate in recommended program.  Potentially Note that insurance prior authorization may be required for reimbursement for recommended care.  Comment: Rehab Admissions Coordinator to follow up.  Thanks,  Ranelle Oyster, MD, [place] Dom  I have personally performed a face to face diagnostic evaluation of this patient. Additionally, I have reviewed and concur with the physician assistant's documentation above.    Mcarthur Rossetti Angiulli, PA-C 10/01/2018

## 2018-10-01 NOTE — Plan of Care (Signed)
  Problem: Education: Goal: Knowledge of General Education information will improve Description Including pain rating scale, medication(s)/side effects and non-pharmacologic comfort measures Outcome: Progressing   Problem: Health Behavior/Discharge Planning: Goal: Ability to manage health-related needs will improve Outcome: Progressing   Problem: Clinical Measurements: Goal: Ability to maintain clinical measurements within normal limits will improve Outcome: Progressing Goal: Will remain free from infection Outcome: Progressing Goal: Diagnostic test results will improve Outcome: Progressing Goal: Respiratory complications will improve Outcome: Progressing Goal: Cardiovascular complication will be avoided Outcome: Progressing   Problem: Activity: Goal: Risk for activity intolerance will decrease Outcome: Progressing   Problem: Nutrition: Goal: Adequate nutrition will be maintained Outcome: Progressing   Problem: Coping: Goal: Level of anxiety will decrease Outcome: Progressing   Problem: Elimination: Goal: Will not experience complications related to urinary retention Outcome: Progressing   Problem: Pain Managment: Goal: General experience of comfort will improve Outcome: Progressing   Problem: Skin Integrity: Goal: Risk for impaired skin integrity will decrease Outcome: Progressing   Problem: Cardiac: Goal: Ability to achieve and maintain adequate cardiopulmonary perfusion will improve Outcome: Progressing   Problem: Progression Barriers to Patient Restraint Goal: Returns to Baseline, No longer Needs Restraint Outcome: Completed/Met   Problem: Education: Goal: Knowledge of General Education information will improve Description Including pain rating scale, medication(s)/side effects and non-pharmacologic comfort measures Outcome: Progressing   Problem: Health Behavior/Discharge Planning: Goal: Ability to manage health-related needs will improve Outcome:  Progressing   Problem: Clinical Measurements: Goal: Ability to maintain clinical measurements within normal limits will improve Outcome: Progressing Goal: Will remain free from infection Outcome: Progressing Goal: Diagnostic test results will improve Outcome: Progressing Goal: Respiratory complications will improve Outcome: Progressing Goal: Cardiovascular complication will be avoided Outcome: Progressing   Problem: Activity: Goal: Risk for activity intolerance will decrease Outcome: Progressing   Problem: Nutrition: Goal: Adequate nutrition will be maintained Outcome: Progressing   Problem: Coping: Goal: Level of anxiety will decrease Outcome: Progressing   Problem: Elimination: Goal: Will not experience complications related to urinary retention Outcome: Progressing   Problem: Pain Managment: Goal: General experience of comfort will improve Outcome: Progressing   Problem: Skin Integrity: Goal: Risk for impaired skin integrity will decrease Outcome: Progressing   Problem: Cardiac: Goal: Ability to achieve and maintain adequate cardiopulmonary perfusion will improve Outcome: Progressing

## 2018-10-01 NOTE — Progress Notes (Signed)
Physical Therapy Treatment Patient Details Name: Billy Simon MRN: [id number] DOB: 05-21-1956 Today's Date: 10/01/2018    History of Present Illness 62 yo admitted with complete heart block 10/15 requiring temp pacer and intubation, extubated 10/16, reintubated 10/17 and permanent pacer placed, 10/24 severe agitation with encephalopathy. Extubated 10/29. PMHx: schizoaffective disorder    PT Comments    Pt progressing with mobility.    Follow Up Recommendations  CIR;Supervision/Assistance - 24 hour     Equipment Recommendations  Other (comment)(To be determined)    Recommendations for Other Services Rehab consult     Precautions / Restrictions Precautions Precautions: Fall;ICD/Pacemaker Precaution Comments: Cortrak Restrictions Weight Bearing Restrictions: No    Mobility  Bed Mobility Overal bed mobility: Needs Assistance Bed Mobility: Sit to Supine     Supine to sit: Min assist Sit to supine: +2 for physical assistance;Min assist   General bed mobility comments: assist to lower trunk and bring legs back up into the bed  Transfers Overall transfer level: Needs assistance Equipment used: Ambulation equipment used Transfers: Sit to/from Stand Sit to Stand: +2 physical assistance;Mod assist         General transfer comment: Assist to bring hips up and for balance. Verbal/tactile cues to extend hips and trunk. Used Stedy to pivot from chair to bed.  Ambulation/Gait                 Stairs             Wheelchair Mobility    Modified Rankin (Stroke Patients Only)       Balance Overall balance assessment: Needs assistance Sitting-balance support: Feet supported Sitting balance-Leahy Scale: Fair     Standing balance support: Bilateral upper extremity supported Standing balance-Leahy Scale: Poor Standing balance comment: Stood with Stedy with +2 mod assist for 5-10 sec                            Cognition Arousal/Alertness:  Awake/alert Behavior During Therapy: Flat affect;Anxious;Impulsive Overall Cognitive Status: Impaired/Different from baseline Area of Impairment: Orientation;Attention;Memory;Following commands;Safety/judgement;Awareness                 Orientation Level: Disoriented to;Place;Time;Situation Current Attention Level: Sustained Memory: Decreased short-term memory Following Commands: Follows one step commands with increased time;Follows one step commands inconsistently Safety/Judgement: Decreased awareness of safety;Decreased awareness of deficits Awareness: Intellectual          Exercises      General Comments General comments (skin integrity, edema, etc.): Pt on 2L of O2 throughout with SpO2>90%. Dyspnea 3/4 with mobility but ?anxiety      Pertinent Vitals/Pain Pain Assessment: Faces Faces Pain Scale: No hurt Pain Location: buttocks with pericare Pain Descriptors / Indicators: Grimacing Pain Intervention(s): Monitored during session;Repositioned    Home Living                      Prior Function            PT Goals (current goals can now be found in the care plan section) Progress towards PT goals: Progressing toward goals    Frequency    Min 3X/week      PT Plan Current plan remains appropriate    Co-evaluation      AM-PAC PT "6 Clicks" Daily Activity  Outcome Measure  Difficulty turning over in bed (including adjusting bedclothes, sheets and blankets)?: Unable Difficulty moving from lying on back to sitting on the side of the  bed? : Unable Difficulty sitting down on and standing up from a chair with arms (e.g., wheelchair, bedside commode, etc,.)?: Unable Help needed moving to and from a bed to chair (including a wheelchair)?: Total Help needed walking in hospital room?: Total Help needed climbing 3-5 steps with a railing? : Total 6 Click Score: 6    End of Session Equipment Utilized During Treatment: Gait belt;Oxygen Activity  Tolerance: Patient tolerated treatment well Patient left: with call bell/phone within reach;with family/visitor present;in bed;with nursing/sitter in room Nurse Communication: Mobility status;Need for lift equipment(nurse assisted with transfer) PT Visit Diagnosis: Other abnormalities of gait and mobility (R26.89)     Time: 1610-9604 PT Time Calculation (min) (ACUTE ONLY): 15 min  Charges:  $Therapeutic Activity: 8-22 mins                     [facility] PT Acute Rehabilitation Services Pager [phone] Office [phone]    Angelina Ok [facility] 10/01/2018, 3:44 PM

## 2018-10-01 NOTE — Progress Notes (Signed)
Nutrition Follow Up  DOCUMENTATION CODES:   Not applicable  INTERVENTION:    Vital AF 1.2 back to goal rate of 80 ml/hr (1920 ml per day)  Provides 2304 kcals, 144 gm protein, 1557 ml of free water daily  NUTRITION DIAGNOSIS:   Inadequate oral intake now related to dysphagia as evidenced by NPO status, ongoing  GOAL:   Patient will meet greater than or equal to 90% of their needs, met  MONITOR:   TF tolerance, Diet advancement, PO intake, Labs, Skin, Weight trends, I & O's  ASSESSMENT:   62 yo Male with h/o complete heart block required a cath and temporary femoral pacer insertion on 09/14/2018.    10/18 Cortrak placed, tip in region of Ligament of [place]itz 10/29 extubated   Pt s/p MBSS 10/30. SLP rec continued NPO status at this time. Vital AF 1.2 infusing at goal rate of 80 ml/hr via Cortrak tube. Free water flushes at 200 ml every 4 hours via tube.  Pt with hx of schizoaffective disorder, anxiety & depression. Medications reviewed. Protonix started 10/29. Labs reviewed. Mg, Phos, K WNL. CBG's [id number].  For repeat MBSS Monday, 11/4.  Diet Order:   Diet Order            Diet NPO time specified  Diet effective now             EDUCATION NEEDS:   Not appropriate for education at this time  Skin:  Skin Assessment: Skin Integrity Issues: Skin Integrity Issues:: Other (Comment) Other: MASD to buttocks  Last BM:  10/31   Intake/Output Summary (Last 24 hours) at 10/01/2018 1107 Last data filed at 10/01/2018 0700 Gross per 24 hour  Intake 2761.29 ml  Output 1402 ml  Net 1359.29 ml   Height:   Ht Readings from Last 1 Encounters:  09/19/18 6' (1.829 m)   Weight:   Wt Readings from Last 1 Encounters:  10/01/18 92.8 kg   Ideal Body Weight:  81 kg  BMI:  Body mass index is 27.75 kg/m.   Estimated Nutritional Needs:   Kcal:  1937-9024  Protein:  135-150 gm  Fluid:  per MD  Arthur Holms, RD, LDN Pager #: [phone] After-Hours Pager #:  [phone]

## 2018-10-01 NOTE — Evaluation (Signed)
Occupational Therapy Evaluation Patient Details Name: Billy Simon MRN: [id number] DOB: March 10, 1956 Today's Date: 10/01/2018    History of Present Illness 62 yo admitted with complete heart block 10/15 requiring temp pacer and intubation, extubated 10/16, reintubated 10/17 and permanent pacer placed, 10/24 severe agitation with encephalopathy. Extubated 10/29. PMHx: schizoaffective disorder   Clinical Impression   Pt aware of need for BM. Assisted pt to [facility] and then chair using sara stedy and 2 person assist. Pt is typically independent. Currently NPO and needs max total assist for ADL. He fatigues easily and demonstrates impaired cognition and anxiety. Pt will need intensive rehab, recommending CIR. He has excellent family support. Will follow acutely.    Follow Up Recommendations  CIR    Equipment Recommendations  3 in 1 bedside commode    Recommendations for Other Services       Precautions / Restrictions Precautions Precautions: Fall;ICD/Pacemaker Precaution Comments: Cortrak Restrictions Weight Bearing Restrictions: No      Mobility Bed Mobility Overal bed mobility: Needs Assistance Bed Mobility: Supine to Sit     Supine to sit: Min assist     General bed mobility comments: cues for technique, use of rail, min assist to raise trunk  Transfers Overall transfer level: Needs assistance Equipment used: Ambulation equipment used Transfers: Sit to/from Stand Sit to Stand: +2 physical assistance;Max assist;Mod assist         General transfer comment: assist to rise and tuck bottom, +2 mod from bed, +2 max from [facility]    Balance Overall balance assessment: Needs assistance   Sitting balance-Leahy Scale: Fair       Standing balance-Leahy Scale: Poor Standing balance comment: able to achieve fully upright with second stand with sara stedy                           ADL either performed or assessed with clinical judgement   ADL Overall ADL's : Needs  assistance/impaired Eating/Feeding: NPO   Grooming: Sitting;Total assistance;Brushing hair   Upper Body Bathing: Maximal assistance;Sitting   Lower Body Bathing: Total assistance;+2 for physical assistance;Sit to/from stand   Upper Body Dressing : Maximal assistance;Sitting   Lower Body Dressing: +2 for physical assistance;Total assistance;Sit to/from stand   Toilet Transfer: Total assistance;+2 for physical assistance;BSC(with sara stedy)   Toileting- Clothing Manipulation and Hygiene: +2 for physical assistance;Total assistance;Sit to/from stand       Functional mobility during ADLs: (unable to ambulate)       Vision Patient Visual Report: No change from baseline       Perception     Praxis      Pertinent Vitals/Pain Pain Assessment: Faces Faces Pain Scale: Hurts little more Pain Location: buttocks with pericare Pain Descriptors / Indicators: Grimacing Pain Intervention(s): Monitored during session;Repositioned     Hand Dominance Right   Extremity/Trunk Assessment Upper Extremity Assessment Upper Extremity Assessment: Overall WFL for tasks assessed   Lower Extremity Assessment Lower Extremity Assessment: Defer to PT evaluation       Communication Communication Communication: Expressive difficulties   Cognition Arousal/Alertness: Awake/alert Behavior During Therapy: Flat affect;Anxious;Impulsive Overall Cognitive Status: Impaired/Different from baseline Area of Impairment: Orientation;Attention;Memory;Following commands;Safety/judgement;Awareness                 Orientation Level: Disoriented to;Place;Time;Situation Current Attention Level: Sustained Memory: Decreased short-term memory Following Commands: Follows one step commands with increased time;Follows one step commands inconsistently Safety/Judgement: Decreased awareness of safety;Decreased awareness of deficits Awareness: Intellectual  General Comments       Exercises      Shoulder Instructions      Home Living Family/patient expects to be discharged to:: Private residence Living Arrangements: Spouse/significant other Available Help at Discharge: Family Type of Home: House Home Access: Stairs to enter Secretary/administrator of Steps: 4 Entrance Stairs-Rails: Left Home Layout: One level     Bathroom Shower/Tub: Producer, television/film/video: Standard     Home Equipment: Information systems manager - built in          Prior Functioning/Environment Level of Independence: Independent        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        OT Problem List: Decreased strength;Decreased activity tolerance;Impaired balance (sitting and/or standing);Decreased cognition;Decreased safety awareness;Decreased knowledge of use of DME or AE;Cardiopulmonary status limiting activity;Pain      OT Treatment/Interventions: Self-care/ADL training;DME and/or AE instruction;Therapeutic activities;Cognitive remediation/compensation;Balance training;Patient/family education    OT Goals(Current goals can be found in the care plan section) Acute Rehab OT Goals Patient Stated Goal: to drink a milkshake OT Goal Formulation: With patient Time For Goal Achievement: 10/15/18 Potential to Achieve Goals: Good ADL Goals Pt Will Perform Eating: with set-up;sitting(once allowed to eat/drink) Pt Will Perform Grooming: with supervision;with set-up;sitting Pt Will Perform Upper Body Dressing: with min assist;sitting Pt Will Perform Lower Body Dressing: with mod assist;sit to/from stand Pt Will Transfer to Toilet: with mod assist;ambulating;bedside commode Additional ADL Goal #1: Pt will follow 2 step commands with 50% accuracy.  OT Frequency: Min 2X/week   Barriers to D/C:            Co-evaluation PT/OT/SLP Co-Evaluation/Treatment: Yes Reason for Co-Treatment: For patient/therapist safety;Complexity of the patient's impairments (multi-system  involvement)   OT goals addressed during session: ADL's and self-care      AM-PAC PT "6 Clicks" Daily Activity     Outcome Measure Help from another person eating meals?: Total Help from another person taking care of personal grooming?: Total Help from another person toileting, which includes using toliet, bedpan, or urinal?: Total Help from another person bathing (including washing, rinsing, drying)?: A Lot Help from another person to put on and taking off regular upper body clothing?: A Lot Help from another person to put on and taking off regular lower body clothing?: Total 6 Click Score: 8   End of Session Equipment Utilized During Treatment: Gait belt;Oxygen Nurse Communication: Need for lift equipment  Activity Tolerance: Patient limited by fatigue Patient left: in chair;with call bell/phone within reach;with chair alarm set;with family/visitor present  OT Visit Diagnosis: Unsteadiness on feet (R26.81);Pain;Muscle weakness (generalized) (M62.81);Other symptoms and signs involving cognitive function;Cognitive communication deficit (R41.841)                Time: 9604-5409 OT Time Calculation (min): 33 min Charges:  OT General Charges $OT Visit: 1 Visit OT Evaluation $OT Eval Moderate Complexity: 1 Mod  Evern Bio 10/01/2018, 12:09 PM  Martie Round, OTR/L Acute Rehabilitation Services Pager: [phone] Office: [phone]

## 2018-10-01 NOTE — Progress Notes (Signed)
PULMONARY / CRITICAL CARE MEDICINE   NAME:  Billy Simon, MRN:  [id number], DOB:  03-Jun-1956, LOS: 28 ADMISSION DATE:  09/14/2018, CONSULTATION DATE: 09/14/2018  REFERRING MD: Cardiology, CHIEF COMPLAINT: Complete heart block  BRIEF HISTORY:    62 year old with h/o complete heart block required a cath and temporary femoral pacer insertion on 09/14/2018.. Intubated for respiratory failure with hypoxia, hypercarbia, bloody frothy sputum / HCAP extensive infx BL.  Briefly on pressors for hypotension. He was extubated 10/29   SIGNIFICANT PAST MEDICAL HISTORY   Depression and anxiety  SIGNIFICANT EVENTS:  09/14/18 Admitted with complete heart block requiring temporary pacer insertion and intubation. 10/16 Extubated. Started on precedex for agitation 10/17 Reintubated for permanent pacemaker insertion.  Kept on vent due to agitation. 10/19 Ongoing agitation, head CT and EEG are unremarkable. Started on broad abx for persistent fevers, trach seceretions 09/19/18 - bronch BAL 0 95% polys 10/24  - sevee agitation, neuro called. Restarted home cogentin, buspar, celexa and increase risperdal to high home dose, failed pecedex, back on diprivan 10/25 - - afebrile since 09/22/18 . Creat slightly up; vanc level 37 and vanc on hold. Needing both precedex and diprivan for agitation control. Home psych meds restarted  Yesterday. This AM followed command. Wife reports improved following commnads. But failed SBT due to tachypnea. Being diuresed - 75m Lasix tid. Improved balance +7L was +10L  10/26 - fluid balance down to +6.5L with diuresis. Aafebrfile. CReat rising again with lasix. WBC rising to 20K.  Failed sBT within few minutes.   09/26/18 - lasix reduced and now on hold following hypotension. +6.3L . Creat better after lasix hold and fluid bolus ,   Had diarrhea but on laxatives  09/28/2018 extubated   STUDIES:    2D echo- 09/14/2018 Focal basal hypertrophy, LVEF 60-65%, paradoxical septal motion  consistent with right ventricular pacing. CT head 09/16/2018- mild ventricular megaly, no acute intracranial findings, right frontal sinusitis. EEG 10/90/19-mild diffuse slowing, no seizures. CT head 10/24 > nil acute, mastoid effusion+, age advanced cerebral atrophy +  CULTURES:  HIV 10/15 - neg MRSA PCR 10/15 - positive Blood culture 10/17 -ng Blood cultures 10/19 -ng Trach aspirate 10/19 - nml flora  Resp BAL 10/20 > 95% polys. NORMal flora Urine culture 10/19-ng resp 10/27 >> nml flora ..............Marland Kitchen    ANTIBIOTICS:  Cefazolin 10/17- 10/18 Vanco 10/19 >10/26 Zosyn 10/19 > 10/27 Cefepime 10/27 >>    LINES/TUBES:  09/14/2018 endotracheal tube>>10/16, retubed 10/17 >> extubated 09/28/2018 09/14/2018 right femoral pacing>> 10/17 Rt IJ CVL10/19 >>  CONSULTANTS:  09/14/18 pulmonary critical care   SUBJECTIVE/OVERNIGHT/INTERVAL HX    Intermittent confusion. Afebrile. Remains on low-dose Precedex drip    CONSTITUTIONAL: BP 116/64   Pulse 79   Temp 98.1 F (36.7 C) (Oral)   Resp (!) 26   Ht 6' (1.829 m)   Wt 92.8 kg   SpO2 99%   BMI 27.75 kg/m   I/O last 3 completed shifts: In: 4638.7 [I.V.:1274.5; NG/GT:3070; IV Piggyback:294.2] Out: 2752 [Urine:2352; Emesis/NG output:400]      Chronically ill-appearing, no distress, calm Safety mittens on. Decreased breath sounds on left, clear on right. 1+ edema. S1-S2 normal. Oxygen saturation 100% nasal cannula. No pallor/icterus is soft and nontender abdomen.  Chest x-ray 10/31 personally reviewed which shows extensive infiltrate on left and clearing of infiltrates on right     ASSESSMENT AND PLAN    #Acute respiratory failure in the setting of complete heart block complicated by hypoxia and acidosis.  HCAP /  aspiration pneumonia. -Decrease nasal cannula -Pulmonary toilet -Mobilization    Severe schizoaffective disorder - multiple drugs  Severe agitation, Acute encephalopathy -   Doubt serotonin  syndrome, NMS.  History of anxiety, depression on psych meds x 30 yrs.  -Taper Precedex to off -ct  Wellbutrin BuSpar Celexa Xanax and Risperdal -seen by Psych  -Use Haldol prn only if agitated   #Complete heart block with PPM placed 10/18. -Paced as needed   #AKI - resolved 09/18/18    -Resume Lasix   Hypernatremia, improved  - continue free water -Discontinue dextrose  #Dysphagia - Tube feeds via Cortrak.,  Repeat swallow evaluation on Monday   #Sepsis Syndrome - Pneumonia +/- Sinus -Continue cefepime for 14 days total   SUMMARY OF TODAY'S PLAN:  He is much improved in terms of encephalopathy although some confusion persists.  Can take safety mittens off while family at bedside. He needs aggressive PT and repeat swallow evaluation to assess. Can transfer to stepdown once off Precedex   Best Practice / Goals of Care / Disposition.    GOALS OF CARE: Code full FAMILY DISCUSSIONS: Updated wife on a daily basis. His son's wedding is next Friday and he hopes to get out of the hospital before then   DISPOSITION sdu  Kara Mead MD. [facility]. [place] [facility] Pager [phone] If no response call [phone]    10/01/2018, 10:24 AM

## 2018-10-01 NOTE — Plan of Care (Signed)
  Problem: Clinical Measurements: Goal: Ability to maintain clinical measurements within normal limits will improve Outcome: Progressing Goal: Will remain free from infection Outcome: Progressing Goal: Diagnostic test results will improve Outcome: Progressing Goal: Respiratory complications will improve Outcome: Progressing Goal: Cardiovascular complication will be avoided Outcome: Progressing   Problem: Activity: Goal: Risk for activity intolerance will decrease Outcome: Progressing   Problem: Nutrition: Goal: Adequate nutrition will be maintained Outcome: Progressing   Problem: Coping: Goal: Level of anxiety will decrease Outcome: Progressing   Problem: Pain Managment: Goal: General experience of comfort will improve Outcome: Progressing   Problem: Skin Integrity: Goal: Risk for impaired skin integrity will decrease Outcome: Progressing   Problem: Role Relationship: Goal: Method of communication will improve Outcome: Progressing

## 2018-10-01 NOTE — Progress Notes (Signed)
Physical Therapy Treatment Patient Details Name: Billy Simon MRN: [id number] DOB: 06-22-1956 Today's Date: 10/01/2018    History of Present Illness 62 yo admitted with complete heart block 10/15 requiring temp pacer and intubation, extubated 10/16, reintubated 10/17 and permanent pacer placed, 10/24 severe agitation with encephalopathy. Extubated 10/29. PMHx: schizoaffective disorder    PT Comments    Pt making good progress with mobility. Continue to feel he would be good CIR candidate.   Follow Up Recommendations  CIR;Supervision/Assistance - 24 hour     Equipment Recommendations  Other (comment)(To be determined)    Recommendations for Other Services Rehab consult     Precautions / Restrictions Precautions Precautions: Fall;ICD/Pacemaker Precaution Comments: Cortrak Restrictions Weight Bearing Restrictions: No    Mobility  Bed Mobility Overal bed mobility: Needs Assistance Bed Mobility: Supine to Sit     Supine to sit: Min assist     General bed mobility comments: cues for technique, use of rail, min assist to raise trunk. Incr time to perform  Transfers Overall transfer level: Needs assistance Equipment used: Ambulation equipment used Transfers: Sit to/from Stand Sit to Stand: +2 physical assistance;Max assist;Mod assist         General transfer comment: assist to rise and tuck bottom, +2 mod from bed, +2 max from [facility]  Ambulation/Gait                 Stairs             Wheelchair Mobility    Modified Rankin (Stroke Patients Only)       Balance Overall balance assessment: Needs assistance   Sitting balance-Leahy Scale: Fair     Standing balance support: Bilateral upper extremity supported Standing balance-Leahy Scale: Poor Standing balance comment: Stood with Stedy with +2 mod assist for 20-30 sec                            Cognition Arousal/Alertness: Awake/alert Behavior During Therapy: Flat  affect;Anxious;Impulsive Overall Cognitive Status: Impaired/Different from baseline Area of Impairment: Orientation;Attention;Memory;Following commands;Safety/judgement;Awareness                 Orientation Level: Disoriented to;Place;Time;Situation Current Attention Level: Sustained Memory: Decreased short-term memory Following Commands: Follows one step commands with increased time;Follows one step commands inconsistently Safety/Judgement: Decreased awareness of safety;Decreased awareness of deficits Awareness: Intellectual          Exercises      General Comments General comments (skin integrity, edema, etc.): Pt on 5L of O2 throughout with SpO2>90%. Dyspnea 3/4 with mobility but ?anxiety      Pertinent Vitals/Pain Pain Assessment: Faces Faces Pain Scale: Hurts little more Pain Location: buttocks with pericare Pain Descriptors / Indicators: Grimacing Pain Intervention(s): Monitored during session;Repositioned    Home Living Family/patient expects to be discharged to:: Private residence Living Arrangements: Spouse/significant other Available Help at Discharge: Family Type of Home: House Home Access: Stairs to enter Entrance Stairs-Rails: Left Home Layout: One level Home Equipment: Information systems manager - built in      Prior Function Level of Independence: Independent      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   PT Goals (current goals can now be found in the care plan section) Acute Rehab PT Goals Patient Stated Goal: to drink a milkshake Progress towards PT goals: Progressing toward goals    Frequency    Min 3X/week      PT Plan Current plan remains appropriate  Co-evaluation PT/OT/SLP Co-Evaluation/Treatment: Yes Reason for Co-Treatment: For patient/therapist safety;Complexity of the patient's impairments (multi-system involvement) PT goals addressed during session: Mobility/safety with mobility;Balance OT goals addressed  during session: ADL's and self-care      AM-PAC PT "6 Clicks" Daily Activity  Outcome Measure  Difficulty turning over in bed (including adjusting bedclothes, sheets and blankets)?: Unable Difficulty moving from lying on back to sitting on the side of the bed? : Unable Difficulty sitting down on and standing up from a chair with arms (e.g., wheelchair, bedside commode, etc,.)?: Unable Help needed moving to and from a bed to chair (including a wheelchair)?: Total Help needed walking in hospital room?: Total Help needed climbing 3-5 steps with a railing? : Total 6 Click Score: 6    End of Session Equipment Utilized During Treatment: Gait belt;Oxygen Activity Tolerance: Patient tolerated treatment well Patient left: in chair;with call bell/phone within reach;with chair alarm set;with family/visitor present Nurse Communication: Mobility status;Need for lift equipment PT Visit Diagnosis: Other abnormalities of gait and mobility (R26.89)     Time: 1610-9604 PT Time Calculation (min) (ACUTE ONLY): 29 min  Charges:  $Therapeutic Activity: 8-22 mins                     [facility] PT Acute Rehabilitation Services Pager [phone] Office [phone]    [facility] 10/01/2018, 12:27 PM

## 2018-10-02 ENCOUNTER — Inpatient Hospital Stay (HOSPITAL_COMMUNITY): Payer: PRIVATE HEALTH INSURANCE

## 2018-10-02 DIAGNOSIS — A419 Sepsis, unspecified organism: Secondary | ICD-10-CM

## 2018-10-02 DIAGNOSIS — Z959 Presence of cardiac and vascular implant and graft, unspecified: Secondary | ICD-10-CM

## 2018-10-02 LAB — RENAL FUNCTION PANEL
Albumin: 2.5 g/dL — ABNORMAL LOW (ref 3.5–5.0)
Anion gap: 8 (ref 5–15)
BUN: 17 mg/dL (ref 8–23)
CO2: 22 mmol/L (ref 22–32)
Calcium: 8.4 mg/dL — ABNORMAL LOW (ref 8.9–10.3)
Chloride: 111 mmol/L (ref 98–111)
Creatinine, Ser: 0.84 mg/dL (ref 0.61–1.24)
GFR calc Af Amer: >60 mL/min (ref >60)
GFR calc non Af Amer: >60 mL/min (ref >60)
Glucose, Bld: 126 mg/dL — ABNORMAL HIGH (ref 70–99)
Phosphorus: 2.9 mg/dL (ref 2.5–4.6)
Potassium: 3.3 mmol/L — ABNORMAL LOW (ref 3.5–5.1)
Sodium: 141 mmol/L (ref 135–145)

## 2018-10-02 LAB — CBC WITH DIFFERENTIAL/PLATELET
Abs Immature Granulocytes: 0.5 10*3/uL — ABNORMAL HIGH (ref 0.00–0.07)
Basophils Absolute: 0.1 10*3/uL (ref 0.0–0.1)
Basophils Relative: 0 %
Eosinophils Absolute: 0.1 10*3/uL (ref 0.0–0.5)
Eosinophils Relative: 1 %
HCT: 38.8 % — ABNORMAL LOW (ref 39.0–52.0)
Hemoglobin: 12.1 g/dL — ABNORMAL LOW (ref 13.0–17.0)
Immature Granulocytes: 3 %
Lymphocytes Relative: 6 %
Lymphs Abs: 1 10*3/uL (ref 0.7–4.0)
MCH: 28.1 pg (ref 26.0–34.0)
MCHC: 31.2 g/dL (ref 30.0–36.0)
MCV: 90 fL (ref 80.0–100.0)
Monocytes Absolute: 0.9 10*3/uL (ref 0.1–1.0)
Monocytes Relative: 6 %
Neutro Abs: 13.8 10*3/uL — ABNORMAL HIGH (ref 1.7–7.7)
Neutrophils Relative %: 84 %
Platelets: 365 10*3/uL (ref 150–400)
RBC: 4.31 MIL/uL (ref 4.22–5.81)
RDW: 16.6 % — ABNORMAL HIGH (ref 11.5–15.5)
WBC: 16.4 10*3/uL — ABNORMAL HIGH (ref 4.0–10.5)
nRBC: 0 % (ref 0.0–0.2)

## 2018-10-02 LAB — GLUCOSE, CAPILLARY
GLUCOSE-CAPILLARY: 101 mg/dL — AB (ref 70–99)
GLUCOSE-CAPILLARY: 108 mg/dL — AB (ref 70–99)
GLUCOSE-CAPILLARY: 96 mg/dL (ref 70–99)
Glucose-Capillary: 118 mg/dL — ABNORMAL HIGH (ref 70–99)
Glucose-Capillary: 100 mg/dL — ABNORMAL HIGH (ref 70–99)

## 2018-10-02 LAB — BRAIN NATRIURETIC PEPTIDE: B Natriuretic Peptide: 110.3 pg/mL — ABNORMAL HIGH (ref 0.0–100.0)

## 2018-10-02 LAB — PROCALCITONIN: Procalcitonin: 0.19 ng/mL

## 2018-10-02 LAB — MAGNESIUM: Magnesium: 2.1 mg/dL (ref 1.7–2.4)

## 2018-10-02 MED ORDER — POTASSIUM CHLORIDE 20 MEQ PO PACK
40.0000 meq | PACK | Freq: Once | ORAL | Status: AC
  Administered 2018-10-02: 40 meq
  Filled 2018-10-02: qty 2

## 2018-10-02 MED ORDER — TORSEMIDE 20 MG PO TABS
20.0000 mg | ORAL_TABLET | Freq: Every day | ORAL | Status: DC
  Administered 2018-10-02 – 2018-10-11 (×10): 20 mg via ORAL
  Filled 2018-10-02 (×10): qty 1

## 2018-10-02 NOTE — Progress Notes (Signed)
   10/02/18 2200  Clinical Encounter Type  Visited With Family  Visit Type Initial;Social support;Spiritual support  Referral From Other (Comment) (met when mtg w/ another pt's family)  Stress Factors  Family Stress Factors Exhausted;Major life changes   Met pt's wife in waiting room when talking w/ another pt's spouse (these two family members had met while here for their hospitalized husbands).  Supportive listening.  Myra Gianotti resident, [phone]

## 2018-10-02 NOTE — Progress Notes (Signed)
PROGRESS NOTE    Billy Simon  URK:[id number] DOB: 03-22-1956 DOA: 09/14/2018 PCP: No primary care provider on file.  Outpatient Specialists:   Brief Narrative:  Patient is a 62 year old Caucasian male, with past medical history significant for depression, anxiety, syncope, was recently found to be in complete heart block requiring cardiac cath (normal coronaries) and temporary femoral pacer insertion on 09/14/2018.  As per prior documentations, patient was intubated for respiratory failure with hypoxia, hypercarbia, bloody frothy sputum / HCAP extensive infx BL.  Briefly on pressors for hypotension. Patient was extubated 10/29.   Hospital course has also been complicated by acute kidney injury, electrolyte abnormality, severe agitation necessitating use of Precedex and benzodiazepine, and acute encephalopathy that is resolving slowly.  Echo revealed normal EF, but the diastolic function could not be assessed.  Cardiac catheterization revealed mildly elevated LVEDP.  Patient's oxygen requirement is down to 1 L/min via nasal cannula, and the O2 sat is 95%.  Patient still looks weak.  Patient seems to get easily agitated.  10/02/2018: Patient is new to me.  This is my first contact with the patient.  Patient was seen alongside patient's wife, son and nurse.  Patient's family questions were answered.  Patient's wife, specifically, as for patient's dose of Xanax to be increased.  I explained to the patient's wife that we need to be careful with mind altering medications in the face of confusion and respiratory failure.  SIGNIFICANT EVENTS:  09/14/18 Admitted with complete heart block requiring temporary pacer insertion and intubation. 10/16 Extubated. Started on precedex for agitation 10/17 Reintubated for permanent pacemaker insertion.  Kept on vent due to agitation. 10/19 Ongoing agitation, head CT and EEG are unremarkable. Started on broad abx for persistent fevers, trach seceretions 09/19/18 -  bronch BAL 0 95% polys 10/24  - sevee agitation, neuro called. Restarted home cogentin, buspar, celexa and increase risperdal to high home dose, failed pecedex, back on diprivan 10/25 - - afebrile since 09/22/18 . Creat slightly up; vanc level 37 and vanc on hold. Needing both precedex and diprivan for agitation control. Home psych meds restarted  Yesterday. This AM followed command. Wife reports improved following commnads. But failed SBT due to tachypnea. Being diuresed - 28m Lasix tid. Improved balance +7L was +10L  10/26 - fluid balance down to +6.5L with diuresis. Aafebrfile. CReat rising again with lasix. WBC rising to 20K.  Failed sBT within few minutes.   09/26/18 - lasix reduced and now on hold following hypotension. +6.3L . Creat better after lasix hold and fluid bolus ,   Had diarrhea but on laxatives  09/28/2018 extubated   STUDIES:    2D echo- 09/14/2018 Focal basal hypertrophy, LVEF 60-65%, paradoxical septal motion consistent with right ventricular pacing. CT head 09/16/2018- mild ventricular megaly, no acute intracranial findings, right frontal sinusitis. EEG 10/90/19-mild diffuse slowing, no seizures. CT head 10/24 > nil acute, mastoid effusion+, age advanced cerebral atrophy +  CULTURES:  HIV 10/15 - neg MRSA PCR 10/15 - positive Blood culture 10/17 -ng Blood cultures 10/19 -ng Trach aspirate 10/19 - nml flora  Resp BAL 10/20 > 95% polys. NORMal flora Urine culture 10/19-ng resp 10/27 >> nml flora  Assessment & Plan:   Principal Problem:   Delirium due to another medical condition Active Problems:   CHB (complete heart block) (HCC)   Complete heart block (HCC)   Encephalopathy acute   Pressure injury of skin   #Acute respiratory failure in the setting of complete heart block complicated by hypoxia  and acidosis.  HCAP / aspiration pneumonia: - Patient is currently on oxygen  1L/Min via nasal cannula -Oxygen saturation is 95% -Continue Pulmonary  toiletry -Mobilize patient    Severe schizoaffective disorder - multiple drugs Severe agitation, Acute encephalopathy -   Doubt serotonin syndrome, NMS.  History of anxiety, depression on psych meds x 30 yrs.  -Off of Precedex. -On Risperidone, Citalopram, Wellbutrin, buspirone - On PRN Haldol and PRN Xanax. - Minimize Mind altering medications   #Complete heart block: - PPM placed 10/18. -Paced as needed   #AKI - resolved 09/18/18   -Resume Diuretics (Torsemide 61m po once daily) - Monitor renal function and electrolytes closely.   Hypernatremia: Resolved Free water  Dysphagia - High aspiration risk - Speech therapy input - Tube feeds via Cortrak.  #Sepsis Syndrome - Pneumonia +/- Sinus -Continue cefepime for 14 days total - Continue to monitor Leukocytosis.   DVT prophylaxis: Subcu heparin. Code Status: Full code Family Communication: Wife and son Disposition Plan: Likely rehab   Consultants:   Patient has been seen by cardiology and pulmonary team  Procedures:   Pacemaker placement  Status post intubation and extubation  Antimicrobials:  Cefazolin 10/17- 10/18 Vanco 10/19 >10/26 Zosyn 10/19 > 10/27 Cefepime 10/27 >>   Subjective: No history from the patient. Patient remains agitated and a bit anxious. No fever documented  Objective: Vitals:   10/02/18 0700 10/02/18 0745 10/02/18 0800 10/02/18 0900  BP: (!) 151/73  (!) 148/74 129/74  Pulse: 93  100 (!) 105  Resp: (!) 29  (!) 25 (!) 29  Temp:  98 F (36.7 C)    TempSrc:  Oral    SpO2: 98%  98% (!) 89%  Weight:      Height:        Intake/Output Summary (Last 24 hours) at 10/02/2018 0912 Last data filed at 10/02/2018 0900 Gross per 24 hour  Intake 2400.94 ml  Output 2900 ml  Net -499.06 ml   Filed Weights   09/29/18 0455 10/01/18 0416 10/02/18 0500  Weight: 92.2 kg 92.8 kg 92.8 kg    Examination:  General exam: Appears agitated and a bit anxious.    Respiratory  system: Mild crackles left lung field posteriorly.   Cardiovascular system: S1 & S2 heard.  Mild leg edema.   Gastrointestinal system: Abdomen is obese, soft and nontender.  Organs are difficult to assess.   Central nervous system: Awake and alert.  Agitated.  Anxious.  Moves all limbs.   Extremities: Mild leg edema.   Psychiatry: Anxious and agitated  Data Reviewed: I have personally reviewed following labs and imaging studies  CBC: Recent Labs  Lab 09/26/18 0500 09/27/18 0533 09/28/18 0428 09/29/18 0732 09/30/18 0239 10/01/18 0253  WBC 21.5* 18.8* 13.7* 12.9* 15.5* 14.9*  NEUTROABS 17.8* 15.3* 10.4*  --   --   --   HGB 11.0* 10.4* 9.3* 10.9* 9.6* 10.2*  HCT 35.0* 34.5* 31.8* 35.9* 31.7* 33.1*  MCV 91.6 95.8 96.4 95.5 92.2 91.9  PLT 413* 481* 463* 381 510* 4607   Basic Metabolic Panel: Recent Labs  Lab 09/27/18 0533 09/27/18 1737 09/28/18 0428 09/29/18 0732 09/30/18 0239 10/01/18 0253  NA 156* 153* 150* 146* 146* 144  K 3.7 3.5 3.4* 5.4* 3.1* 3.7  CL 123* 121* 119* 120* 117* 116*  CO2 _0 19* 21* 22  GLUCOSE 119* 141* 145* 94 107* 100*  BUN 38* 35* 33* 29* 26* 20  CREATININE 1.13 1.09 1.02 0.93 0.95 0.90  CALCIUM  7.9* 7.8* 8.0* 8.1* 7.9* 8.1*  MG 2.6*  --  2.6* 2.5* 2.3 2.2  PHOS 3.2  --  3.3 3.9 3.3 3.4   GFR: Estimated Creatinine Clearance: 94.6 mL/min (by C-G formula based on SCr of 0.9 mg/dL). Liver Function Tests: Recent Labs  Lab 09/26/18 1149  AST 163*  ALT 163*  ALKPHOS 55  BILITOT 0.6  PROT 6.3*  ALBUMIN 2.2*   No results for input(s): LIPASE, AMYLASE in the last 168 hours. No results for input(s): AMMONIA in the last 168 hours. Coagulation Profile: Recent Labs  Lab 09/26/18 0500  INR 1.20   Cardiac Enzymes: Recent Labs  Lab 09/25/18 1054 09/27/18 0533  CKTOTAL 746* 365  CKMB 2.3 2.4   BNP (last 3 results) No results for input(s): PROBNP in the last 8760 hours. HbA1C: No results for input(s): HGBA1C in the last 72  hours. CBG: Recent Labs  Lab 10/01/18 1607 10/01/18 2028 10/02/18 0010 10/02/18 0413 10/02/18 0742  GLUCAP 108* 97 108* 96 118*   Lipid Profile: No results for input(s): CHOL, HDL, LDLCALC, TRIG, CHOLHDL, LDLDIRECT in the last 72 hours. Thyroid Function Tests: No results for input(s): TSH, T4TOTAL, FREET4, T3FREE, THYROIDAB in the last 72 hours. Anemia Panel: No results for input(s): VITAMINB12, FOLATE, FERRITIN, TIBC, IRON, RETICCTPCT in the last 72 hours. Urine analysis:    Component Value Date/Time   COLORURINE YELLOW 09/18/2018 0813   APPEARANCEUR CLEAR 09/18/2018 0813   LABSPEC 1.023 09/18/2018 0813   PHURINE 5.0 09/18/2018 0813   GLUCOSEU NEGATIVE 09/18/2018 0813   HGBUR LARGE (A) 09/18/2018 0813   BILIRUBINUR NEGATIVE 09/18/2018 0813   KETONESUR 5 (A) 09/18/2018 0813   PROTEINUR 100 (A) 09/18/2018 0813   NITRITE NEGATIVE 09/18/2018 0813   LEUKOCYTESUR NEGATIVE 09/18/2018 0813   Sepsis Labs: _0 (procalcitonin:4,lacticidven:4)  ) Recent Results (from the past 240 hour(s))  Culture, respiratory (non-expectorated)     Status: None   Collection Time: 09/26/18 10:21 AM  Result Value Ref Range Status   Specimen Description TRACHEAL ASPIRATE  Final   Special Requests Normal  Final   Gram Stain   Final    MODERATE WBC PRESENT, PREDOMINANTLY PMN FEW GRAM POSITIVE COCCI    Culture   Final    MODERATE Consistent with normal respiratory flora. Performed at [facility], [place] [address] [place], [place] [address]    Report Status 09/28/2018 FINAL  Final  Culture, blood (Routine X 2) w Reflex to ID Panel     Status: None   Collection Time: 09/26/18 10:45 AM  Result Value Ref Range Status   Specimen Description BLOOD LEFT ANTECUBITAL  Final   Special Requests   Final    BOTTLES DRAWN AEROBIC ONLY Blood Culture adequate volume   Culture   Final    NO GROWTH 5 DAYS Performed at [facility], [place] [address] [place], [place] [address]    Report  Status 10/01/2018 FINAL  Final  Culture, blood (Routine X 2) w Reflex to ID Panel     Status: None   Collection Time: 09/26/18 10:46 AM  Result Value Ref Range Status   Specimen Description BLOOD LEFT HAND  Final   Special Requests   Final    BOTTLES DRAWN AEROBIC ONLY Blood Culture adequate volume   Culture   Final    NO GROWTH 5 DAYS Performed at [facility], [place] [address] [place], [place] [address]    Report Status 10/01/2018 FINAL  Final  Culture,  Urine     Status: None   Collection Time: 09/26/18 11:58 AM  Result Value Ref Range Status   Specimen Description URINE, RANDOM  Final   Special Requests NONE  Final   Culture   Final    NO GROWTH Performed at [facility], [address], [place], [place] [address]    Report Status 09/27/2018 FINAL  Final         Radiology Studies: Dg Abd 1 View  Result Date: 10/01/2018 CLINICAL DATA:  Nasogastric tube placement. EXAM: ABDOMEN - 1 VIEW COMPARISON:  09/17/2018 FINDINGS: The tip of the nasogastric tube projects in the expected location third portion of the duodenum. There is enteric contrast throughout the colon. No dilated loops of bowel. Compared to 09/17/2018, the tip of the feeding tube is more proximal along the duodenal course. IMPRESSION: Tip of the feeding tube likely in the third portion of the duodenum. Electronically Signed   By: Ulyses Jarred M.D.   On: 10/01/2018 22:13        Scheduled Meds: . benztropine  1 mg Oral Daily  . buPROPion  100 mg Oral TID  . busPIRone  15 mg Oral Daily  . chlorhexidine gluconate (MEDLINE KIT)  15 mL Mouth Rinse BID  . citalopram  40 mg Oral Daily  . free water  200 mL Per Tube Q4H  . heparin  5,000 Units Subcutaneous Q8H  . mouth rinse  15 mL Mouth Rinse 10 times per day  . pantoprazole sodium  40 mg Per Tube Q1200  . polyethylene glycol  17 g Oral Daily  . risperiDONE  6 mg Per Tube Daily   Continuous Infusions: . sodium chloride    . sodium chloride Stopped  (10/01/18 2302)  . ceFEPime (MAXIPIME) IV Stopped (10/02/18 0701)  . dexmedetomidine (PRECEDEX) IV infusion Stopped (10/01/18 1238)  . feeding supplement (VITAL AF 1.2 CAL) 1,000 mL (10/01/18 1354)     LOS: 18 days    Time spent: 35 minutes.    Dana Allan, MD  [facility] Pager #: [phone] 7PM-7AM contact night coverage as above

## 2018-10-03 LAB — RENAL FUNCTION PANEL
Albumin: 2.3 g/dL — ABNORMAL LOW (ref 3.5–5.0)
Anion gap: 7 (ref 5–15)
BUN: 19 mg/dL (ref 8–23)
CO2: 25 mmol/L (ref 22–32)
Calcium: 8.2 mg/dL — ABNORMAL LOW (ref 8.9–10.3)
Chloride: 109 mmol/L (ref 98–111)
Creatinine, Ser: 0.88 mg/dL (ref 0.61–1.24)
GFR calc Af Amer: >60 mL/min (ref >60)
GFR calc non Af Amer: >60 mL/min (ref >60)
Glucose, Bld: 115 mg/dL — ABNORMAL HIGH (ref 70–99)
Phosphorus: 3.3 mg/dL (ref 2.5–4.6)
Potassium: 3.2 mmol/L — ABNORMAL LOW (ref 3.5–5.1)
Sodium: 141 mmol/L (ref 135–145)

## 2018-10-03 LAB — GLUCOSE, CAPILLARY
GLUCOSE-CAPILLARY: 98 mg/dL (ref 70–99)
Glucose-Capillary: 116 mg/dL — ABNORMAL HIGH (ref 70–99)
Glucose-Capillary: 130 mg/dL — ABNORMAL HIGH (ref 70–99)
Glucose-Capillary: 106 mg/dL — ABNORMAL HIGH (ref 70–99)
Glucose-Capillary: 105 mg/dL — ABNORMAL HIGH (ref 70–99)

## 2018-10-03 LAB — CBC WITH DIFFERENTIAL/PLATELET
Abs Immature Granulocytes: 0.52 10*3/uL — ABNORMAL HIGH (ref 0.00–0.07)
Basophils Absolute: 0.1 10*3/uL (ref 0.0–0.1)
Basophils Relative: 0 %
Eosinophils Absolute: 0.3 10*3/uL (ref 0.0–0.5)
Eosinophils Relative: 2 %
HCT: 32.3 % — ABNORMAL LOW (ref 39.0–52.0)
Hemoglobin: 10.4 g/dL — ABNORMAL LOW (ref 13.0–17.0)
Immature Granulocytes: 3 %
Lymphocytes Relative: 9 %
Lymphs Abs: 1.4 10*3/uL (ref 0.7–4.0)
MCH: 29.1 pg (ref 26.0–34.0)
MCHC: 32.2 g/dL (ref 30.0–36.0)
MCV: 90.2 fL (ref 80.0–100.0)
Monocytes Absolute: 1.2 10*3/uL — ABNORMAL HIGH (ref 0.1–1.0)
Monocytes Relative: 7 %
Neutro Abs: 12.5 10*3/uL — ABNORMAL HIGH (ref 1.7–7.7)
Neutrophils Relative %: 79 %
Platelets: 428 10*3/uL — ABNORMAL HIGH (ref 150–400)
RBC: 3.58 MIL/uL — ABNORMAL LOW (ref 4.22–5.81)
RDW: 16.6 % — ABNORMAL HIGH (ref 11.5–15.5)
WBC: 15.9 10*3/uL — ABNORMAL HIGH (ref 4.0–10.5)
nRBC: 0 % (ref 0.0–0.2)

## 2018-10-03 MED ORDER — POTASSIUM CHLORIDE CRYS ER 20 MEQ PO TBCR: 40.0000 meq | EXTENDED_RELEASE_TABLET | ORAL | Status: DC

## 2018-10-03 MED ORDER — POTASSIUM CHLORIDE 20 MEQ/15ML (10%) PO SOLN
40.0000 meq | ORAL | Status: AC
  Administered 2018-10-03 (×2): 40 meq via ORAL
  Filled 2018-10-03 (×2): qty 30

## 2018-10-03 NOTE — Progress Notes (Signed)
Manual adjusted for time change

## 2018-10-03 NOTE — Progress Notes (Signed)
PROGRESS NOTE    Billy Simon  WNU:[id number] DOB: 29-Apr-1956 DOA: 09/14/2018 PCP: No primary care provider on file.  Outpatient Specialists:   Brief Narrative:  Patient is a 62 year old Caucasian male, with past medical history significant for depression, anxiety, syncope, was recently found to be in complete heart block requiring cardiac cath (normal coronaries) and temporary femoral pacer insertion on 09/14/2018.  As per prior documentations, patient was intubated for respiratory failure with hypoxia, hypercarbia, bloody frothy sputum / HCAP extensive infx BL.  Briefly on pressors for hypotension. Patient was extubated 10/29.   Hospital course has also been complicated by acute kidney injury, electrolyte abnormality, severe agitation necessitating use of Precedex and benzodiazepine, and acute encephalopathy that is resolving slowly.  Echo revealed normal EF, but the diastolic function could not be assessed.  Cardiac catheterization revealed mildly elevated LVEDP.  Patient's oxygen requirement is down to 1 L/min via nasal cannula, and the O2 sat is 95%.  Patient still looks weak.  Patient seems to get easily agitated.  10/02/2018: Patient is new to me.  This is my first contact with the patient.  Patient was seen alongside patient's wife, son and nurse.  Patient's family questions were answered.  Patient's wife, specifically, as for patient's dose of Xanax to be increased.  I explained to the patient's wife that we need to be careful with mind altering medications in the face of confusion and respiratory failure.  10/03/18: Patient seen alongside patient's wife and nurse.  Patient continues to improve.  Slight improvement noted in the leukocytosis.  Overall, patient is better today.  Will consult physical therapy.  Will replete low potassium.  We will continue to monitor renal function, electrolytes and blood counts.  SIGNIFICANT EVENTS:  09/14/18 Admitted with complete heart block requiring  temporary pacer insertion and intubation. 10/16 Extubated. Started on precedex for agitation 10/17 Reintubated for permanent pacemaker insertion.  Kept on vent due to agitation. 10/19 Ongoing agitation, head CT and EEG are unremarkable. Started on broad abx for persistent fevers, trach seceretions 09/19/18 - bronch BAL 0 95% polys 10/24  - sevee agitation, neuro called. Restarted home cogentin, buspar, celexa and increase risperdal to high home dose, failed pecedex, back on diprivan 10/25 - - afebrile since 09/22/18 . Creat slightly up; vanc level 37 and vanc on hold. Needing both precedex and diprivan for agitation control. Home psych meds restarted  Yesterday. This AM followed command. Wife reports improved following commnads. But failed SBT due to tachypnea. Being diuresed - 28m Lasix tid. Improved balance +7L was +10L  10/26 - fluid balance down to +6.5L with diuresis. Aafebrfile. CReat rising again with lasix. WBC rising to 20K.  Failed sBT within few minutes.   09/26/18 - lasix reduced and now on hold following hypotension. +6.3L . Creat better after lasix hold and fluid bolus ,   Had diarrhea but on laxatives  09/28/2018 extubated   STUDIES:    2D echo- 09/14/2018 Focal basal hypertrophy, LVEF 60-65%, paradoxical septal motion consistent with right ventricular pacing. CT head 09/16/2018- mild ventricular megaly, no acute intracranial findings, right frontal sinusitis. EEG 10/90/19-mild diffuse slowing, no seizures. CT head 10/24 > nil acute, mastoid effusion+, age advanced cerebral atrophy +  CULTURES:  HIV 10/15 - neg MRSA PCR 10/15 - positive Blood culture 10/17 -ng Blood cultures 10/19 -ng Trach aspirate 10/19 - nml flora  Resp BAL 10/20 > 95% polys. NORMal flora Urine culture 10/19-ng resp 10/27 >> nml flora  Assessment & Plan:   Principal  Problem:   Delirium due to another medical condition Active Problems:   CHB (complete heart block) (HCC)   Complete heart  block (HCC)   Encephalopathy acute   Pressure injury of skin   #Acute respiratory failure in the setting of complete heart block complicated by hypoxia and acidosis.  HCAP / aspiration pneumonia: - Patient is currently on oxygen  1L/Min via nasal cannula -Oxygen saturation is 95% -Continue Pulmonary toiletry -Mobilize patient -Physical therapy consult.    Severe schizoaffective disorder - multiple drugs Severe agitation, Acute encephalopathy -   Doubt serotonin syndrome, NMS.  History of anxiety, depression on psych meds x 30 yrs.  -Off of Precedex. -On Risperidone, Citalopram, Wellbutrin, buspirone - On PRN Haldol and PRN Xanax. - Minimize Mind altering medications -Stable for now.   #Complete heart block: - PPM placed 10/18. -Paced as needed   #AKI - resolved 09/18/18   -Results. -Resume Diuretics (Torsemide 68m po once daily) - Monitor renal function and electrolytes closely.   Hypernatremia: Resolved Free water  Dysphagia - High aspiration risk - Speech therapy input - Tube feeds via Cortrak.  #Sepsis Syndrome - Pneumonia +/- Sinus -Continue cefepime for 14 days total - Continue to monitor Leukocytosis.   DVT prophylaxis: Subcu heparin. Code Status: Full code Family Communication: Wife and son Disposition Plan: Likely rehab   Consultants:   Patient has been seen by cardiology and pulmonary team  Procedures:   Pacemaker placement  Status post intubation and extubation  Antimicrobials:  Cefazolin 10/17- 10/18 Vanco 10/19 >10/26 Zosyn 10/19 > 10/27 Cefepime 10/27 >>   Subjective: No fever chills.   No shortness of breath or chest pain.   Patient looks better today.   Patient is also more communicative today.  Objective: Vitals:   10/03/18 0900 10/03/18 1000 10/03/18 1100 10/03/18 1203  BP: 132/68   111/71  Pulse: 87 95 95 99  Resp:  _0 Temp:    98.3 F (36.8 C)  TempSrc:      SpO2: 98% 100% 99% 100%  Weight:       Height:        Intake/Output Summary (Last 24 hours) at 10/03/2018 1348 Last data filed at 10/03/2018 0900 Gross per 24 hour  Intake 2950.06 ml  Output 3350 ml  Net -399.94 ml   Filed Weights   10/01/18 0416 10/02/18 0500 10/03/18 0500  Weight: 92.8 kg 92.8 kg 92.3 kg    Examination:  General exam: Not in any distress.  Patient is awake and alert.      Respiratory system: Mild crackles left lung field posteriorly.   Cardiovascular system: S1 & S2 heard.  Mild leg edema.   Gastrointestinal system: Abdomen is obese, soft and nontender.  Organs are difficult to assess.   Central nervous system: Awake and alert.  Agitated.  Anxious.  Moves all limbs.   Extremities: Mild leg edema.   Psychiatry: Anxious and agitated  Data Reviewed: I have personally reviewed following labs and imaging studies  CBC: Recent Labs  Lab 09/27/18 0533 09/28/18 0428 09/29/18 0732 09/30/18 0239 10/01/18 0253 10/02/18 1054 10/03/18 0318  WBC 18.8* 13.7* 12.9* 15.5* 14.9* 16.4* 15.9*  NEUTROABS 15.3* 10.4*  --   --   --  13.8* 12.5*  HGB 10.4* 9.3* 10.9* 9.6* 10.2* 12.1* 10.4*  HCT 34.5* 31.8* 35.9* 31.7* 33.1* 38.8* 32.3*  MCV 95.8 96.4 95.5 92.2 91.9 90.0 90.2  PLT 481* 463* 381 510* 450* 365 4622   Basic Metabolic Panel:  Recent Labs  Lab 09/28/18 0428 09/29/18 0732 09/30/18 0239 10/01/18 0253 10/02/18 1054 10/03/18 0318  NA 150* 146* 146* 144 141 141  K 3.4* 5.4* 3.1* 3.7 3.3* 3.2*  CL 119* 120* 117* 116* 111 109  CO2 25 19* 21* _0 GLUCOSE 145* 94 107* 100* 126* 115*  BUN 33* 29* 26* _1 CREATININE 1.02 0.93 0.95 0.90 0.84 0.88  CALCIUM 8.0* 8.1* 7.9* 8.1* 8.4* 8.2*  MG 2.6* 2.5* 2.3 2.2 2.1  --   PHOS 3.3 3.9 3.3 3.4 2.9 3.3   GFR: Estimated Creatinine Clearance: 96.8 mL/min (by C-G formula based on SCr of 0.88 mg/dL). Liver Function Tests: Recent Labs  Lab 10/02/18 1054 10/03/18 0318  ALBUMIN 2.5* 2.3*   No results for input(s): LIPASE, AMYLASE in the last  168 hours. No results for input(s): AMMONIA in the last 168 hours. Coagulation Profile: No results for input(s): INR, PROTIME in the last 168 hours. Cardiac Enzymes: Recent Labs  Lab 09/27/18 0533  CKTOTAL 365  CKMB 2.4   BNP (last 3 results) No results for input(s): PROBNP in the last 8760 hours. HbA1C: No results for input(s): HGBA1C in the last 72 hours. CBG: Recent Labs  Lab 10/02/18 2021 10/02/18 2332 10/03/18 0403 10/03/18 0757 10/03/18 1215  GLUCAP 100* 101* 116* 98 130*   Lipid Profile: No results for input(s): CHOL, HDL, LDLCALC, TRIG, CHOLHDL, LDLDIRECT in the last 72 hours. Thyroid Function Tests: No results for input(s): TSH, T4TOTAL, FREET4, T3FREE, THYROIDAB in the last 72 hours. Anemia Panel: No results for input(s): VITAMINB12, FOLATE, FERRITIN, TIBC, IRON, RETICCTPCT in the last 72 hours. Urine analysis:    Component Value Date/Time   COLORURINE YELLOW 09/18/2018 0813   APPEARANCEUR CLEAR 09/18/2018 0813   LABSPEC 1.023 09/18/2018 0813   PHURINE 5.0 09/18/2018 0813   GLUCOSEU NEGATIVE 09/18/2018 0813   HGBUR LARGE (A) 09/18/2018 0813   BILIRUBINUR NEGATIVE 09/18/2018 0813   KETONESUR 5 (A) 09/18/2018 0813   PROTEINUR 100 (A) 09/18/2018 0813   NITRITE NEGATIVE 09/18/2018 0813   LEUKOCYTESUR NEGATIVE 09/18/2018 0813   Sepsis Labs: _2 (procalcitonin:4,lacticidven:4)  ) Recent Results (from the past 240 hour(s))  Culture, respiratory (non-expectorated)     Status: None   Collection Time: 09/26/18 10:21 AM  Result Value Ref Range Status   Specimen Description TRACHEAL ASPIRATE  Final   Special Requests Normal  Final   Gram Stain   Final    MODERATE WBC PRESENT, PREDOMINANTLY PMN FEW GRAM POSITIVE COCCI    Culture   Final    MODERATE Consistent with normal respiratory flora. Performed at [facility], [place] [address] [place], [place] [address]    Report Status 09/28/2018 FINAL  Final  Culture, blood (Routine X 2) w Reflex to ID  Panel     Status: None   Collection Time: 09/26/18 10:45 AM  Result Value Ref Range Status   Specimen Description BLOOD LEFT ANTECUBITAL  Final   Special Requests   Final    BOTTLES DRAWN AEROBIC ONLY Blood Culture adequate volume   Culture   Final    NO GROWTH 5 DAYS Performed at [facility], [place] [address] [place], [place] [address]    Report Status 10/01/2018 FINAL  Final  Culture, blood (Routine X 2) w Reflex to ID Panel     Status: None   Collection Time: 09/26/18 10:46 AM  Result Value Ref Range Status   Specimen Description BLOOD LEFT HAND  Final   Special Requests   Final    BOTTLES DRAWN AEROBIC ONLY Blood Culture adequate volume   Culture   Final    NO GROWTH 5 DAYS Performed at [facility], [address], [place], [place] [address]    Report Status 10/01/2018 FINAL  Final  Culture, Urine     Status: None   Collection Time: 09/26/18 11:58 AM  Result Value Ref Range Status   Specimen Description URINE, RANDOM  Final   Special Requests NONE  Final   Culture   Final    NO GROWTH Performed at [facility], [place] [address] [place], [place] [address]    Report Status 09/27/2018 FINAL  Final         Radiology Studies: Dg Abd 1 View  Result Date: 10/01/2018 CLINICAL DATA:  Nasogastric tube placement. EXAM: ABDOMEN - 1 VIEW COMPARISON:  09/17/2018 FINDINGS: The tip of the nasogastric tube projects in the expected location third portion of the duodenum. There is enteric contrast throughout the colon. No dilated loops of bowel. Compared to 09/17/2018, the tip of the feeding tube is more proximal along the duodenal course. IMPRESSION: Tip of the feeding tube likely in the third portion of the duodenum. Electronically Signed   By: Ulyses Jarred M.D.   On: 10/01/2018 22:13   Dg Chest Port 1 View  Result Date: 10/02/2018 CLINICAL DATA:  Follow-up infiltrate EXAM: PORTABLE CHEST 1 VIEW COMPARISON:  09/30/2018 FINDINGS: Cardiac shadow is stable. Pacing  device and feeding catheter are again seen and stable. Diffuse infiltrate throughout the left lung is again noted. The right lung remains relatively clear. No bony abnormality is seen. IMPRESSION: Stable diffuse opacities throughout the left lung. Electronically Signed   By: Inez Catalina M.D.   On: 10/02/2018 15:13        Scheduled Meds: . benztropine  1 mg Oral Daily  . buPROPion  100 mg Oral TID  . busPIRone  15 mg Oral Daily  . chlorhexidine gluconate (MEDLINE KIT)  15 mL Mouth Rinse BID  . citalopram  40 mg Oral Daily  . free water  200 mL Per Tube Q4H  . heparin  5,000 Units Subcutaneous Q8H  . mouth rinse  15 mL Mouth Rinse 10 times per day  . pantoprazole sodium  40 mg Per Tube Q1200  . polyethylene glycol  17 g Oral Daily  . risperiDONE  6 mg Per Tube Daily  . torsemide  20 mg Oral Daily   Continuous Infusions: . sodium chloride    . sodium chloride Stopped (10/03/18 0300)  . feeding supplement (VITAL AF 1.2 CAL) 1,000 mL (10/02/18 2011)     LOS: 19 days    Time spent: 35 minutes.    Dana Allan, MD  [facility] Pager #: [phone] 7PM-7AM contact night coverage as above

## 2018-10-04 ENCOUNTER — Inpatient Hospital Stay (HOSPITAL_COMMUNITY): Payer: PRIVATE HEALTH INSURANCE

## 2018-10-04 LAB — CBC WITH DIFFERENTIAL/PLATELET
Abs Immature Granulocytes: 0.46 10*3/uL — ABNORMAL HIGH (ref 0.00–0.07)
Basophils Absolute: 0.1 10*3/uL (ref 0.0–0.1)
Basophils Relative: 1 %
Eosinophils Absolute: 0.3 10*3/uL (ref 0.0–0.5)
Eosinophils Relative: 2 %
HCT: 36.6 % — ABNORMAL LOW (ref 39.0–52.0)
Hemoglobin: 11.6 g/dL — ABNORMAL LOW (ref 13.0–17.0)
Immature Granulocytes: 3 %
Lymphocytes Relative: 11 %
Lymphs Abs: 1.7 10*3/uL (ref 0.7–4.0)
MCH: 28.8 pg (ref 26.0–34.0)
MCHC: 31.7 g/dL (ref 30.0–36.0)
MCV: 90.8 fL (ref 80.0–100.0)
Monocytes Absolute: 1.1 10*3/uL — ABNORMAL HIGH (ref 0.1–1.0)
Monocytes Relative: 8 %
Neutro Abs: 11.1 10*3/uL — ABNORMAL HIGH (ref 1.7–7.7)
Neutrophils Relative %: 75 %
Platelets: 365 10*3/uL (ref 150–400)
RBC: 4.03 MIL/uL — ABNORMAL LOW (ref 4.22–5.81)
RDW: 17.1 % — ABNORMAL HIGH (ref 11.5–15.5)
WBC: 14.7 10*3/uL — ABNORMAL HIGH (ref 4.0–10.5)
nRBC: 0 % (ref 0.0–0.2)

## 2018-10-04 LAB — RENAL FUNCTION PANEL
Albumin: 2.5 g/dL — ABNORMAL LOW (ref 3.5–5.0)
Anion gap: 9 (ref 5–15)
BUN: 24 mg/dL — ABNORMAL HIGH (ref 8–23)
CO2: 23 mmol/L (ref 22–32)
Calcium: 8.7 mg/dL — ABNORMAL LOW (ref 8.9–10.3)
Chloride: 108 mmol/L (ref 98–111)
Creatinine, Ser: 0.82 mg/dL (ref 0.61–1.24)
GFR calc Af Amer: >60 mL/min (ref >60)
GFR calc non Af Amer: >60 mL/min (ref >60)
Glucose, Bld: 124 mg/dL — ABNORMAL HIGH (ref 70–99)
Phosphorus: 3.6 mg/dL (ref 2.5–4.6)
Potassium: 3.7 mmol/L (ref 3.5–5.1)
Sodium: 140 mmol/L (ref 135–145)

## 2018-10-04 LAB — GLUCOSE, CAPILLARY
GLUCOSE-CAPILLARY: 124 mg/dL — AB (ref 70–99)
GLUCOSE-CAPILLARY: 121 mg/dL — AB (ref 70–99)
GLUCOSE-CAPILLARY: 122 mg/dL — AB (ref 70–99)
Glucose-Capillary: 111 mg/dL — ABNORMAL HIGH (ref 70–99)
Glucose-Capillary: 113 mg/dL — ABNORMAL HIGH (ref 70–99)
Glucose-Capillary: 116 mg/dL — ABNORMAL HIGH (ref 70–99)
Glucose-Capillary: 124 mg/dL — ABNORMAL HIGH (ref 70–99)
Glucose-Capillary: 93 mg/dL (ref 70–99)
Glucose-Capillary: 97 mg/dL (ref 70–99)

## 2018-10-04 MED ORDER — CHLORHEXIDINE GLUCONATE 0.12 % MT SOLN
OROMUCOSAL | Status: AC
  Administered 2018-10-04: 15 mL via OROMUCOSAL
  Filled 2018-10-04: qty 15

## 2018-10-04 MED ORDER — FREE WATER
30.0000 mL | Status: DC
  Administered 2018-10-04 – 2018-10-09 (×26): 30 mL

## 2018-10-04 MED ORDER — POTASSIUM CHLORIDE CRYS ER 20 MEQ PO TBCR
40.0000 meq | EXTENDED_RELEASE_TABLET | Freq: Once | ORAL | Status: AC
  Administered 2018-10-04: 40 meq via ORAL
  Filled 2018-10-04: qty 2

## 2018-10-04 NOTE — Plan of Care (Signed)
  Problem: Education: Goal: Knowledge of General Education information will improve Description Including pain rating scale, medication(s)/side effects and non-pharmacologic comfort measures Outcome: Progressing   Problem: Health Behavior/Discharge Planning: Goal: Ability to manage health-related needs will improve Outcome: Progressing   Problem: Clinical Measurements: Goal: Ability to maintain clinical measurements within normal limits will improve Outcome: Progressing Goal: Will remain free from infection Outcome: Progressing Goal: Diagnostic test results will improve Outcome: Progressing Goal: Respiratory complications will improve Outcome: Progressing Goal: Cardiovascular complication will be avoided Outcome: Progressing   Problem: Activity: Goal: Risk for activity intolerance will decrease Outcome: Progressing   Problem: Nutrition: Goal: Adequate nutrition will be maintained Outcome: Progressing   Problem: Coping: Goal: Level of anxiety will decrease Outcome: Progressing   Problem: Elimination: Goal: Will not experience complications related to urinary retention Outcome: Progressing   Problem: Pain Managment: Goal: General experience of comfort will improve Outcome: Progressing   Problem: Skin Integrity: Goal: Risk for impaired skin integrity will decrease Outcome: Progressing   Problem: Activity: Goal: Ability to tolerate increased activity will improve Outcome: Progressing   Problem: Respiratory: Goal: Ability to maintain a clear airway and adequate ventilation will improve Outcome: Progressing   Problem: Role Relationship: Goal: Method of communication will improve Outcome: Progressing   Problem: Cardiac: Goal: Ability to achieve and maintain adequate cardiopulmonary perfusion will improve Outcome: Progressing

## 2018-10-04 NOTE — Progress Notes (Signed)
PROGRESS NOTE    Billy Simon  KGU:[id number] DOB: 1956/03/10 DOA: 09/14/2018 PCP: No primary care provider on file.  Outpatient Specialists:   Brief Narrative:  Patient is a 62 year old Caucasian male, with past medical history significant for depression, anxiety, syncope, was recently found to be in complete heart block requiring cardiac cath (normal coronaries) and temporary femoral pacer insertion on 09/14/2018.  As per prior documentations, patient was intubated for respiratory failure with hypoxia, hypercarbia, bloody frothy sputum / HCAP extensive infx BL.  Briefly on pressors for hypotension. Patient was extubated 10/29.   Hospital course has also been complicated by acute kidney injury, electrolyte abnormality, severe agitation necessitating use of Precedex and benzodiazepine, and acute encephalopathy that is resolving slowly.  Echo revealed normal EF, but the diastolic function could not be assessed.  Cardiac catheterization revealed mildly elevated LVEDP.  Patient's oxygen requirement is down to 1 L/min via nasal cannula, and the O2 sat is 95%.  Patient still looks weak.  Patient seems to get easily agitated.  10/02/2018: Patient is new to me.  This is my first contact with the patient.  Patient was seen alongside patient's wife, son and nurse.  Patient's family questions were answered.  Patient's wife, specifically, as for patient's dose of Xanax to be increased.  I explained to the patient's wife that we need to be careful with mind altering medications in the face of confusion and respiratory failure.  10/03/18: Patient seen alongside patient's wife and nurse.  Patient continues to improve.  Slight improvement noted in the leukocytosis.  Overall, patient is better today.  Will consult physical therapy.  Will replete low potassium.  We will continue to monitor renal function, electrolytes and blood counts.  10/04/2018: Patient seen alongside patient's wife.  Patient has continued to  improve.  No new complaints.  Patient is still n.p.o.  Speech team is appreciated.  We will continue current management.  SIGNIFICANT EVENTS:  09/14/18 Admitted with complete heart block requiring temporary pacer insertion and intubation. 10/16 Extubated. Started on precedex for agitation 10/17 Reintubated for permanent pacemaker insertion.  Kept on vent due to agitation. 10/19 Ongoing agitation, head CT and EEG are unremarkable. Started on broad abx for persistent fevers, trach seceretions 09/19/18 - bronch BAL 0 95% polys 10/24  - sevee agitation, neuro called. Restarted home cogentin, buspar, celexa and increase risperdal to high home dose, failed pecedex, back on diprivan 10/25 - - afebrile since 09/22/18 . Creat slightly up; vanc level 37 and vanc on hold. Needing both precedex and diprivan for agitation control. Home psych meds restarted  Yesterday. This AM followed command. Wife reports improved following commnads. But failed SBT due to tachypnea. Being diuresed - 54m Lasix tid. Improved balance +7L was +10L  10/26 - fluid balance down to +6.5L with diuresis. Aafebrfile. CReat rising again with lasix. WBC rising to 20K.  Failed sBT within few minutes.   09/26/18 - lasix reduced and now on hold following hypotension. +6.3L . Creat better after lasix hold and fluid bolus ,   Had diarrhea but on laxatives  09/28/2018 extubated   STUDIES:    2D echo- 09/14/2018 Focal basal hypertrophy, LVEF 60-65%, paradoxical septal motion consistent with right ventricular pacing. CT head 09/16/2018- mild ventricular megaly, no acute intracranial findings, right frontal sinusitis. EEG 10/90/19-mild diffuse slowing, no seizures. CT head 10/24 > nil acute, mastoid effusion+, age advanced cerebral atrophy +  CULTURES:  HIV 10/15 - neg MRSA PCR 10/15 - positive Blood culture 10/17 -ng Blood  cultures 10/19 -ng Trach aspirate 10/19 - nml flora  Resp BAL 10/20 > 95% polys. NORMal flora Urine  culture 10/19-ng resp 10/27 >> nml flora  Assessment & Plan:   Principal Problem:   Delirium due to another medical condition Active Problems:   CHB (complete heart block) (HCC)   Complete heart block (HCC)   Encephalopathy acute   Pressure injury of skin   #Acute respiratory failure in the setting of complete heart block complicated by hypoxia and acidosis.  HCAP / aspiration pneumonia: - Patient is currently on oxygen  1L/Min via nasal cannula -Oxygen saturation is 95% -Continue Pulmonary toiletry -Mobilize patient -Physical therapy consult. -Rehab input is appreciated.  Severe schizoaffective disorder - multiple drugs Severe agitation, Acute encephalopathy -   Doubt serotonin syndrome, NMS.  History of anxiety, depression on psych meds x 30 yrs.  -Off of Precedex. -On Risperidone, Citalopram, Wellbutrin, buspirone - On PRN Haldol and PRN Xanax. - Minimize Mind altering medications -Stable for now.  #Complete heart block: - PPM placed 10/18. -Paced as needed  #AKI - resolved 09/18/18   -Results. -Resume Diuretics (Torsemide 69m po once daily) - Monitor renal function and electrolytes closely.   Hypernatremia: Resolved Free water  Dysphagia - High aspiration risk - Speech therapy input - Tube feeds via Cortrak. 10/04/2018: Speech input is appreciated.  Patient is to remain n.p.o.  Continue to fix for now.  #Sepsis Syndrome - Pneumonia +/- Sinus -Continue cefepime for 14 days total - Continue to monitor Leukocytosis.  DVT prophylaxis: Subcu heparin. Code Status: Full code Family Communication: Wife and son Disposition Plan: Likely rehab   Consultants:   Patient has been seen by cardiology and pulmonary team  Procedures:   Pacemaker placement  Status post intubation and extubation  Antimicrobials:  Cefazolin 10/17- 10/18 Vanco 10/19 >10/26 Zosyn 10/19 > 10/27 Cefepime 10/27 >>   Subjective: No fever chills.   No shortness of  breath or chest pain.   Patient looks better today.    Objective: Vitals:   10/04/18 0900 10/04/18 1000 10/04/18 1100 10/04/18 1200  BP: 130/72 113/61 106/71 113/68  Pulse: 77 (!) 105 100 93  Resp: 16 20 (!) 21 (!) 21  Temp:   98.2 F (36.8 C)   TempSrc:   Oral   SpO2: 95% 95% 92% 95%  Weight:      Height:        Intake/Output Summary (Last 24 hours) at 10/04/2018 1543 Last data filed at 10/04/2018 1100 Gross per 24 hour  Intake 1780 ml  Output 675 ml  Net 1105 ml   Filed Weights   10/02/18 0500 10/03/18 0500 10/04/18 0500  Weight: 92.8 kg 92.3 kg 87.8 kg    Examination:  General exam: Not in any distress.  Patient is awake and alert.      Respiratory system: Mild crackles left lung field posteriorly.   Cardiovascular system: S1 & S2 heard.  Mild leg edema.   Gastrointestinal system: Abdomen is obese, soft and nontender.  Organs are difficult to assess.   Central nervous system: Awake and alert.  Agitated.  Anxious.  Moves all limbs.   Extremities: Leg edema is improving.     Psychiatry: Anxious and agitated  Data Reviewed: I have personally reviewed following labs and imaging studies  CBC: Recent Labs  Lab 09/28/18 0428  09/30/18 0239 10/01/18 0253 10/02/18 1054 10/03/18 0318 10/04/18 0545  WBC 13.7*   < > 15.5* 14.9* 16.4* 15.9* 14.7*  NEUTROABS 10.4*  --   --   --  13.8* 12.5* 11.1*  HGB 9.3*   < > 9.6* 10.2* 12.1* 10.4* 11.6*  HCT 31.8*   < > 31.7* 33.1* 38.8* 32.3* 36.6*  MCV 96.4   < > 92.2 91.9 90.0 90.2 90.8  PLT 463*   < > 510* 450* 365 428* 365   < > = values in this interval not displayed.   Basic Metabolic Panel: Recent Labs  Lab 09/28/18 0428 09/29/18 0732 09/30/18 0239 10/01/18 0253 10/02/18 1054 10/03/18 0318 10/04/18 0545  NA 150* 146* 146* 144 141 141 140  K 3.4* 5.4* 3.1* 3.7 3.3* 3.2* 3.7  CL 119* 120* 117* 116* 111 109 108  CO2 25 19* 21* _0 GLUCOSE 145* 94 107* 100* 126* 115* 124*  BUN 33* 29* 26* _1 24*    CREATININE 1.02 0.93 0.95 0.90 0.84 0.88 0.82  CALCIUM 8.0* 8.1* 7.9* 8.1* 8.4* 8.2* 8.7*  MG 2.6* 2.5* 2.3 2.2 2.1  --   --   PHOS 3.3 3.9 3.3 3.4 2.9 3.3 3.6   GFR: Estimated Creatinine Clearance: 103.8 mL/min (by C-G formula based on SCr of 0.82 mg/dL). Liver Function Tests: Recent Labs  Lab 10/02/18 1054 10/03/18 0318 10/04/18 0545  ALBUMIN 2.5* 2.3* 2.5*   No results for input(s): LIPASE, AMYLASE in the last 168 hours. No results for input(s): AMMONIA in the last 168 hours. Coagulation Profile: No results for input(s): INR, PROTIME in the last 168 hours. Cardiac Enzymes: No results for input(s): CKTOTAL, CKMB, CKMBINDEX, TROPONINI in the last 168 hours. BNP (last 3 results) No results for input(s): PROBNP in the last 8760 hours. HbA1C: No results for input(s): HGBA1C in the last 72 hours. CBG: Recent Labs  Lab 10/03/18 2048 10/03/18 2352 10/04/18 0404 10/04/18 0808 10/04/18 1207  GLUCAP 105* 111* 124* 121* 97   Lipid Profile: No results for input(s): CHOL, HDL, LDLCALC, TRIG, CHOLHDL, LDLDIRECT in the last 72 hours. Thyroid Function Tests: No results for input(s): TSH, T4TOTAL, FREET4, T3FREE, THYROIDAB in the last 72 hours. Anemia Panel: No results for input(s): VITAMINB12, FOLATE, FERRITIN, TIBC, IRON, RETICCTPCT in the last 72 hours. Urine analysis:    Component Value Date/Time   COLORURINE YELLOW 09/18/2018 0813   APPEARANCEUR CLEAR 09/18/2018 0813   LABSPEC 1.023 09/18/2018 0813   PHURINE 5.0 09/18/2018 0813   GLUCOSEU NEGATIVE 09/18/2018 0813   HGBUR LARGE (A) 09/18/2018 0813   BILIRUBINUR NEGATIVE 09/18/2018 0813   KETONESUR 5 (A) 09/18/2018 0813   PROTEINUR 100 (A) 09/18/2018 0813   NITRITE NEGATIVE 09/18/2018 0813   LEUKOCYTESUR NEGATIVE 09/18/2018 0813   Sepsis Labs: _2 (procalcitonin:4,lacticidven:4)  ) Recent Results (from the past 240 hour(s))  Culture, respiratory (non-expectorated)     Status: None   Collection Time: 09/26/18  10:21 AM  Result Value Ref Range Status   Specimen Description TRACHEAL ASPIRATE  Final   Special Requests Normal  Final   Gram Stain   Final    MODERATE WBC PRESENT, PREDOMINANTLY PMN FEW GRAM POSITIVE COCCI    Culture   Final    MODERATE Consistent with normal respiratory flora. Performed at [facility], [place] [address], [place], [place] [address]    Report Status 09/28/2018 FINAL  Final  Culture, blood (Routine X 2) w Reflex to ID Panel     Status: None   Collection Time: 09/26/18 10:45 AM  Result Value Ref Range Status   Specimen Description BLOOD LEFT ANTECUBITAL  Final   Special Requests   Final  BOTTLES DRAWN AEROBIC ONLY Blood Culture adequate volume   Culture   Final    NO GROWTH 5 DAYS Performed at [facility], [place] [address], [place], [place] [address]    Report Status 10/01/2018 FINAL  Final  Culture, blood (Routine X 2) w Reflex to ID Panel     Status: None   Collection Time: 09/26/18 10:46 AM  Result Value Ref Range Status   Specimen Description BLOOD LEFT HAND  Final   Special Requests   Final    BOTTLES DRAWN AEROBIC ONLY Blood Culture adequate volume   Culture   Final    NO GROWTH 5 DAYS Performed at [facility], [place] [address] [place], [place] [address]    Report Status 10/01/2018 FINAL  Final  Culture, Urine     Status: None   Collection Time: 09/26/18 11:58 AM  Result Value Ref Range Status   Specimen Description URINE, RANDOM  Final   Special Requests NONE  Final   Culture   Final    NO GROWTH Performed at [facility], [place] [address] [place], [place] [address]    Report Status 09/27/2018 FINAL  Final         Radiology Studies: Dg Chest Port 1 View  Result Date: 10/04/2018 CLINICAL DATA:  Pneumonia EXAM: PORTABLE CHEST 1 VIEW COMPARISON:  10/02/2018 FINDINGS: Diffuse infiltrate throughout the left lung shows mild interval improvement. Right lung remains clear. No effusion. Dual lead pacemaker unchanged.  Feeding  tube enters the stomach. IMPRESSION: Improvement in diffuse infiltrate throughout the left lung. Electronically Signed   By: Franchot Gallo M.D.   On: 10/04/2018 11:17        Scheduled Meds: . benztropine  1 mg Oral Daily  . buPROPion  100 mg Oral TID  . busPIRone  15 mg Oral Daily  . chlorhexidine gluconate (MEDLINE KIT)  15 mL Mouth Rinse BID  . citalopram  40 mg Oral Daily  . free water  30 mL Per Tube Q4H  . heparin  5,000 Units Subcutaneous Q8H  . mouth rinse  15 mL Mouth Rinse 10 times per day  . pantoprazole sodium  40 mg Per Tube Q1200  . polyethylene glycol  17 g Oral Daily  . risperiDONE  6 mg Per Tube Daily  . torsemide  20 mg Oral Daily   Continuous Infusions: . sodium chloride    . sodium chloride Stopped (10/03/18 0300)  . feeding supplement (VITAL AF 1.2 CAL) 1,000 mL (10/04/18 1454)     LOS: 20 days   Time spent: 25 minutes.  Dana Allan, MD  [facility] Pager #: [phone] 7PM-7AM contact night coverage as above

## 2018-10-04 NOTE — Progress Notes (Signed)
Inpatient Rehabilitation-Admissions Coordinator   Followed up with pt and family regarding insurance benefits. Pt's wife stated she would like to discuss with their family wether or not they will proceed with CIR due to cost at this time. AC stressed importance of decision in the next day or two. AC did provide family with financial counselor's number to follow up for assistance.   Please call if questions.   Nanine Means, OTR/L  Rehab Admissions Coordinator  [phone] 10/04/2018 1:57 PM

## 2018-10-04 NOTE — Plan of Care (Signed)
  Problem: Clinical Measurements: Goal: Respiratory complications will improve Outcome: Progressing Goal: Cardiovascular complication will be avoided Outcome: Progressing   Problem: Activity: Goal: Risk for activity intolerance will decrease Outcome: Progressing Note:  PT/OT following patient.   Problem: Nutrition: Goal: Adequate nutrition will be maintained Outcome: Progressing Note:  Patient on tube feeds. SLP following patient.   Problem: Elimination: Goal: Will not experience complications related to urinary retention Outcome: Progressing   Problem: Pain Managment: Goal: General experience of comfort will improve Outcome: Progressing

## 2018-10-04 NOTE — Progress Notes (Signed)
  Speech Language Pathology Treatment: Dysphagia  Patient Details Name: Billy Simon MRN: [id number] DOB: 1955-12-31 Today's Date: 10/04/2018 Time: 0865-7846 SLP Time Calculation (min) (ACUTE ONLY): 18 min  Assessment / Plan / Recommendation Clinical Impression  Pt was alert and attentive during dysphagia treatment session today; he is making slow but consistent progress toward increased pharyngeal strength and readiness for repeat MBSS. Given ice chip trials with min verbal cues, pt exhibited immediate throat clear (X2), suggestive of greater pharyngeal sensation in comparison to previous ST visits. Pt and wife unable to recall instructions for use of expiratory muscle strength trainer (EMST), thus SLP provided explanation and demonstration of it's correct use, and with mod verbal/visual cues, pt completed 20 reps EMST exercises with device set at 17 cm H2O resistance. Given pt's ease of use at 17 cm H2O, SLP increased EMST resistance to 20 cm H2O to create appropriate challenge and increased perceived effort. Following demonstration from SLP and continued max verbal/visual, pt also completed masako maneuver and sustained "e" to further facilitate pharyngeal strengthening. Recommend continue NPO except for 1-3 ice chips with FULL supervision/assist and only AFTER oral care. ST will continue to follow to provide treatment with pharyngeal strengthening exercises, and to determine appropriateness for repeat MBSS.    HPI HPI: 62 year old with a history of syncope who presented with complete heart block requiring intubation 10/15-10/16, reintubated 10/17-10/29 for pacemaker insertion. Pt hypoxic hypercarbic and acidotic in cath labl for right femoral temporary pacing wire placement. Exhibited severe schizoaffective disorder, severe agitation, acute encephalopathy. CT head 10/24 > nil acute, mastoid effusion+, age advanced cerebral atrophy +      SLP Plan  Continue with current plan of care        Recommendations  Diet recommendations: NPO;Other(comment)(ice chips with oral care before) Medication Administration: Via alternative means Supervision: Full supervision/cueing for compensatory strategies;Trained caregiver to feed patient Postural Changes and/or Swallow Maneuvers: Seated upright 90 degrees                Oral Care Recommendations: Oral care QID;Oral care prior to ice chip/H20 Follow up Recommendations: Inpatient Rehab SLP Visit Diagnosis: Dysphagia, unspecified (R13.10) Plan: Continue with current plan of care       Suzzette Righter, Student SLP              Suzzette Righter 10/04/2018, 1:15 PM

## 2018-10-04 NOTE — Consult Note (Signed)
WOC Nurse wound consult note Patient evaluated in [facility] 2H10 in the presence of a male visitor and with the assistance of his primary RN. 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 Patient had a sacral foam dressing on at the time of my evaluation. Wound bed: Slightly erythematous and consistent with fungal involvement. Drainage (amount, consistency, odor) No odor, drainage, or induration Periwound: Normal skin color and texture Dressing procedure/placement/frequency: Do NOT use sacral foam dressings on this patient.  Apply antifungal powder (green and white bottle in clean utility room) TID.  Encourage the patient to turn side to side. I understand from his primary RN, that the patient is no longer incontinent. Monitor the wound area(s) for worsening of condition such as: Signs/symptoms of infection,  Increase in size,  Development of or worsening of odor, Development of pain, or increased pain at the affected locations.  Notify the medical team if any of these develop.  Thank you for the consult.  Discussed plan of care with the patient and bedside nurse.  WOC nurse will not follow at this time.  Please re-consult the WOC team if needed.  Helmut Muster, RN, MSN, CWOCN, CNS-BC, pager [phone]

## 2018-10-04 NOTE — Progress Notes (Signed)
Occupational Therapy Treatment Patient Details Name: Billy Simon MRN: [id number] DOB: 08-29-1956 Today's Date: 10/04/2018    History of present illness 62 yo admitted with complete heart block 10/15 requiring temp pacer and intubation, extubated 10/16, reintubated 10/17 and permanent pacer placed, 10/24 severe agitation with encephalopathy. Extubated 10/29. PMHx: schizoaffective disorder   OT comments  This 62 yo male doing good today with bed mobility, sit<>stand, and grooming at EOB. He will continue to benefit from acute OT with follow up on CIR to continue to progress towards a more independent level so he can return home with wife.  Follow Up Recommendations  CIR    Equipment Recommendations  3 in 1 bedside commode       Precautions / Restrictions Precautions Precautions: Fall;ICD/Pacemaker Precaution Comments: Cortrak; pacemaker (12 days ago) Restrictions Weight Bearing Restrictions: No       Mobility Bed Mobility Overal bed mobility: Needs Assistance Bed Mobility: Rolling;Sidelying to Sit Rolling: Min assist Sidelying to sit: Min assist       General bed mobility comments: VCs for sequencing, min A to get totally rolled over on side and min A to help up with trunk  Transfers Overall transfer level: Needs assistance Equipment used: Rolling walker (2 wheeled) Transfers: Stand Pivot Transfers   Stand pivot transfers: Mod assist;+2 physical assistance       General transfer comment: +2 min A sit<>stand from raised bed x2; +2 Mod A sit<>stand from recliner (stood 30 seconds x3)    Balance Overall balance assessment: Needs assistance Sitting-balance support: No upper extremity supported;Feet supported Sitting balance-Leahy Scale: Fair     Standing balance support: Bilateral upper extremity supported Standing balance-Leahy Scale: Poor Standing balance comment: reliant on RW and other external support                           ADL either performed  or assessed with clinical judgement   ADL Overall ADL's : Needs assistance/impaired     Grooming: Wash/dry hands;Set up;Supervision/safety;Sitting Grooming Details (indicate cue type and reason): EOB                   Toilet Transfer Details (indicate cue type and reason): Min A +2 sit<>stand from raised be with Mod A+2 stand pivot (bed>recliner); Mod A +2 sit<>stand from recliner                 Vision Patient Visual Report: Central vision impairment            Cognition Arousal/Alertness: Awake/alert Behavior During Therapy: Flat affect;Anxious Overall Cognitive Status: Impaired/Different from baseline Area of Impairment: Attention;Following commands;Safety/judgement;Problem solving                   Current Attention Level: Sustained   Following Commands: Follows one step commands consistently Safety/Judgement: Decreased awareness of safety;Decreased awareness of deficits     General Comments: Able to tell me his wife and 2 kids names, as well as his kids ages and what both of them are doing. Has a pet hamster TEFL teacher) that is acutally his dtrs, but they are helping take care of it        Exercises Other Exercises Other Exercises: VCs to use purse lipped breathing post each standing time frame due to increased work of breathing and anxiousness           Pertinent Vitals/ Pain       Pain Assessment: No/denies pain  Frequency  Min 2X/week        Progress Toward Goals  OT Goals(current goals can now be found in the care plan section)  Progress towards OT goals: Progressing toward goals     Plan Discharge plan remains appropriate       AM-PAC PT "6 Clicks" Daily Activity     Outcome Measure   Help from another person eating meals?: (NPO) Help from another person taking care of personal grooming?: A Lot Help from another person toileting, which includes using toliet, bedpan, or urinal?: Total Help from another person bathing  (including washing, rinsing, drying)?: A Lot Help from another person to put on and taking off regular upper body clothing?: A Lot Help from another person to put on and taking off regular lower body clothing?: Total 6 Click Score: 8    End of Session Equipment Utilized During Treatment: Gait belt  OT Visit Diagnosis: Unsteadiness on feet (R26.81);Muscle weakness (generalized) (M62.81);Other symptoms and signs involving cognitive function;Cognitive communication deficit (R41.841)   Activity Tolerance (pt able to tolerate more activity that he has on previous sessions)   Patient Left in chair;with call bell/phone within reach;with family/visitor present   Nurse Communication Mobility status(NT)        Time: 1610-9604 OT Time Calculation (min): 30 min  Charges: OT General Charges $OT Visit: 1 Visit OT Treatments $Self Care/Home Management : 23-37 mins  Ignacia Palma, OTR/L Acute [employer] Pager [phone] Office [phone]      Evette Georges 10/04/2018, 5:47 PM

## 2018-10-05 LAB — CBC WITH DIFFERENTIAL/PLATELET
Abs Immature Granulocytes: 0.42 10*3/uL — ABNORMAL HIGH (ref 0.00–0.07)
Basophils Absolute: 0.1 10*3/uL (ref 0.0–0.1)
Basophils Relative: 0 %
Eosinophils Absolute: 0.4 10*3/uL (ref 0.0–0.5)
Eosinophils Relative: 3 %
HCT: 36.3 % — ABNORMAL LOW (ref 39.0–52.0)
Hemoglobin: 11.6 g/dL — ABNORMAL LOW (ref 13.0–17.0)
Immature Granulocytes: 3 %
Lymphocytes Relative: 13 %
Lymphs Abs: 1.9 10*3/uL (ref 0.7–4.0)
MCH: 29.2 pg (ref 26.0–34.0)
MCHC: 32 g/dL (ref 30.0–36.0)
MCV: 91.4 fL (ref 80.0–100.0)
Monocytes Absolute: 1.1 10*3/uL — ABNORMAL HIGH (ref 0.1–1.0)
Monocytes Relative: 8 %
Neutro Abs: 10.5 10*3/uL — ABNORMAL HIGH (ref 1.7–7.7)
Neutrophils Relative %: 73 %
Platelets: 348 10*3/uL (ref 150–400)
RBC: 3.97 MIL/uL — ABNORMAL LOW (ref 4.22–5.81)
RDW: 17.2 % — ABNORMAL HIGH (ref 11.5–15.5)
WBC: 14.3 10*3/uL — ABNORMAL HIGH (ref 4.0–10.5)
nRBC: 0 % (ref 0.0–0.2)

## 2018-10-05 LAB — GLUCOSE, CAPILLARY
Glucose-Capillary: 111 mg/dL — ABNORMAL HIGH (ref 70–99)
Glucose-Capillary: 112 mg/dL — ABNORMAL HIGH (ref 70–99)
Glucose-Capillary: 120 mg/dL — ABNORMAL HIGH (ref 70–99)
Glucose-Capillary: 122 mg/dL — ABNORMAL HIGH (ref 70–99)
Glucose-Capillary: 125 mg/dL — ABNORMAL HIGH (ref 70–99)
Glucose-Capillary: 140 mg/dL — ABNORMAL HIGH (ref 70–99)

## 2018-10-05 LAB — RENAL FUNCTION PANEL
Albumin: 2.6 g/dL — ABNORMAL LOW (ref 3.5–5.0)
Anion gap: 9 (ref 5–15)
BUN: 30 mg/dL — ABNORMAL HIGH (ref 8–23)
CO2: 26 mmol/L (ref 22–32)
Calcium: 8.8 mg/dL — ABNORMAL LOW (ref 8.9–10.3)
Chloride: 106 mmol/L (ref 98–111)
Creatinine, Ser: 0.9 mg/dL (ref 0.61–1.24)
GFR calc Af Amer: >60 mL/min (ref >60)
GFR calc non Af Amer: >60 mL/min (ref >60)
Glucose, Bld: 112 mg/dL — ABNORMAL HIGH (ref 70–99)
Phosphorus: 3.8 mg/dL (ref 2.5–4.6)
Potassium: 3.8 mmol/L (ref 3.5–5.1)
Sodium: 141 mmol/L (ref 135–145)

## 2018-10-05 LAB — MAGNESIUM: Magnesium: 2.2 mg/dL (ref 1.7–2.4)

## 2018-10-05 MED ORDER — RISPERIDONE 1 MG/ML PO SOLN
2.0000 mg | Freq: Three times a day (TID) | ORAL | Status: DC
  Administered 2018-10-05 – 2018-10-11 (×18): 2 mg
  Filled 2018-10-05 (×21): qty 2

## 2018-10-05 MED ORDER — BUPROPION HCL 100 MG PO TABS
300.0000 mg | ORAL_TABLET | Freq: Every day | ORAL | Status: DC
  Administered 2018-10-06 – 2018-10-07 (×2): 300 mg via ORAL
  Filled 2018-10-05 (×3): qty 3

## 2018-10-05 MED ORDER — VITAL AF 1.2 CAL PO LIQD
1500.0000 mL | ORAL | Status: DC
  Administered 2018-10-05 – 2018-10-07 (×3): 1500 mL
  Filled 2018-10-05 (×5): qty 1500

## 2018-10-05 NOTE — Progress Notes (Signed)
PROGRESS NOTE    Billy Simon  AYT:[id number] DOB: 08/15/1956 DOA: 09/14/2018 PCP: No primary care provider on file.  Outpatient Specialists:   Brief Narrative:  Patient is a 62 year old Caucasian male, with past medical history significant for depression, anxiety, syncope, was recently found to be in complete heart block requiring cardiac cath (normal coronaries) and temporary femoral pacer insertion on 09/14/2018.  As per prior documentations, patient was intubated for respiratory failure with hypoxia, hypercarbia, bloody frothy sputum / HCAP extensive infx BL.  Briefly on pressors for hypotension. Patient was extubated 10/29.   Hospital course has also been complicated by acute kidney injury, electrolyte abnormality, severe agitation necessitating use of Precedex and benzodiazepine, and acute encephalopathy that is resolving slowly.  Echo revealed normal EF, but the diastolic function could not be assessed.  Cardiac catheterization revealed mildly elevated LVEDP.  Patient's oxygen requirement is down to 1 L/min via nasal cannula, and the O2 sat is 95%.  Patient still looks weak.  Patient seems to get easily agitated.  10/02/2018: Patient is new to me.  This is my first contact with the patient.  Patient was seen alongside patient's wife, son and nurse.  Patient's family questions were answered.  Patient's wife, specifically, as for patient's dose of Xanax to be increased.  I explained to the patient's wife that we need to be careful with mind altering medications in the face of confusion and respiratory failure.  10/03/18: Patient seen alongside patient's wife and nurse.  Patient continues to improve.  Slight improvement noted in the leukocytosis.  Overall, patient is better today.  Will consult physical therapy.  Will replete low potassium.  We will continue to monitor renal function, electrolytes and blood counts.  10/05/2018: Patient seen alongside patient's wife.  Patient has continued to  improve.  No new complaints.  Patient is still n.p.o.  Speech team is appreciated.  For possible repeat speech evaluation in the morning.  Rehab team to reassess patient and see if the facility can manage patient.  Patient is still on tube feeds for now.  Further management will depend on hospital course.    SIGNIFICANT EVENTS:  09/14/18 Admitted with complete heart block requiring temporary pacer insertion and intubation. 10/16 Extubated. Started on precedex for agitation 10/17 Reintubated for permanent pacemaker insertion.  Kept on vent due to agitation. 10/19 Ongoing agitation, head CT and EEG are unremarkable. Started on broad abx for persistent fevers, trach seceretions 09/19/18 - bronch BAL 0 95% polys 10/24  - sevee agitation, neuro called. Restarted home cogentin, buspar, celexa and increase risperdal to high home dose, failed pecedex, back on diprivan 10/25 - - afebrile since 09/22/18 . Creat slightly up; vanc level 37 and vanc on hold. Needing both precedex and diprivan for agitation control. Home psych meds restarted  Yesterday. This AM followed command. Wife reports improved following commnads. But failed SBT due to tachypnea. Being diuresed - 59m Lasix tid. Improved balance +7L was +10L  10/26 - fluid balance down to +6.5L with diuresis. Aafebrfile. CReat rising again with lasix. WBC rising to 20K.  Failed sBT within few minutes.   09/26/18 - lasix reduced and now on hold following hypotension. +6.3L . Creat better after lasix hold and fluid bolus ,   Had diarrhea but on laxatives  09/28/2018 extubated   STUDIES:    2D echo- 09/14/2018 Focal basal hypertrophy, LVEF 60-65%, paradoxical septal motion consistent with right ventricular pacing. CT head 09/16/2018- mild ventricular megaly, no acute intracranial findings, right frontal sinusitis.  EEG 10/90/19-mild diffuse slowing, no seizures. CT head 10/24 > nil acute, mastoid effusion+, age advanced cerebral atrophy  +  CULTURES:  HIV 10/15 - neg MRSA PCR 10/15 - positive Blood culture 10/17 -ng Blood cultures 10/19 -ng Trach aspirate 10/19 - nml flora  Resp BAL 10/20 > 95% polys. NORMal flora Urine culture 10/19-ng resp 10/27 >> nml flora  Assessment & Plan:   Principal Problem:   Delirium due to another medical condition Active Problems:   CHB (complete heart block) (HCC)   Complete heart block (HCC)   Encephalopathy acute   Pressure injury of skin   #Acute respiratory failure in the setting of complete heart block complicated by hypoxia and acidosis.  HCAP / aspiration pneumonia: - Patient is currently on oxygen  1L/Min via nasal cannula -Oxygen saturation is 95% -Continue Pulmonary toiletry -Mobilize patient -Physical therapy consult. -Rehab input is appreciated.  Severe schizoaffective disorder - multiple drugs Severe agitation, Acute encephalopathy -   Doubt serotonin syndrome, NMS.  History of anxiety, depression on psych meds x 30 yrs.  -Off of Precedex. -On Risperidone, Citalopram, Wellbutrin, buspirone - On PRN Haldol and PRN Xanax. - Minimize Mind altering medications -Stable for now.  #Complete heart block: - PPM placed 10/18. -Paced as needed  #AKI - resolved 09/18/18   -Results. -Resume Diuretics (Torsemide 88m po once daily) - Monitor renal function and electrolytes closely.   Hypernatremia: Resolved Free water  Dysphagia - High aspiration risk - Speech therapy input - Tube feeds via Cortrak. 10/04/2018: Speech input is appreciated.  Patient is to remain n.p.o.  Continue to fix for now.  #Sepsis Syndrome - Pneumonia +/- Sinus -Continue cefepime for 14 days total - Continue to monitor Leukocytosis.  DVT prophylaxis: Subcu heparin. Code Status: Full code Family Communication: Wife and son Disposition Plan: Likely rehab   Consultants:   Patient has been seen by cardiology and pulmonary team  Procedures:   Pacemaker  placement  Status post intubation and extubation  Antimicrobials:  Cefazolin 10/17- 10/18 Vanco 10/19 >10/26 Zosyn 10/19 > 10/27 Cefepime 10/27 >>   Subjective: No fever chills.   No shortness of breath or chest pain.   Patient looks better today.    Objective: Vitals:   10/05/18 0800 10/05/18 0821 10/05/18 1135 10/05/18 1639  BP:  135/74 125/73 132/75  Pulse:  89 (!) 101 93  Resp: _0 (!) 22  Temp:   97.6 F (36.4 C) 97.6 F (36.4 C)  TempSrc:   Oral Oral  SpO2:   94% 95%  Weight:      Height:        Intake/Output Summary (Last 24 hours) at 10/05/2018 1710 Last data filed at 10/05/2018 1000 Gross per 24 hour  Intake -  Output 500 ml  Net -500 ml   Filed Weights   10/03/18 0500 10/04/18 0500 10/05/18 0410  Weight: 92.3 kg 87.8 kg 88 kg    Examination:  General exam: Not in any distress.  Patient is awake and alert.      Respiratory system: Mild crackles left lung field posteriorly.   Cardiovascular system: S1 & S2 heard.  Mild leg edema.   Gastrointestinal system: Abdomen is obese, soft and nontender.  Organs are difficult to assess.   Central nervous system: Awake and alert.  Agitated.  Anxious.  Moves all limbs.   Extremities: Leg edema is improving.     Psychiatry: Anxious and agitated  Data Reviewed: I have personally reviewed following labs and imaging  studies  CBC: Recent Labs  Lab 10/01/18 0253 10/02/18 1054 10/03/18 0318 10/04/18 0545 10/05/18 0423  WBC 14.9* 16.4* 15.9* 14.7* 14.3*  NEUTROABS  --  13.8* 12.5* 11.1* 10.5*  HGB 10.2* 12.1* 10.4* 11.6* 11.6*  HCT 33.1* 38.8* 32.3* 36.6* 36.3*  MCV 91.9 90.0 90.2 90.8 91.4  PLT 450* 365 428* 365 557   Basic Metabolic Panel: Recent Labs  Lab 09/29/18 0732 09/30/18 0239 10/01/18 0253 10/02/18 1054 10/03/18 0318 10/04/18 0545 10/05/18 0423  NA 146* 146* 144 141 141 140 141  K 5.4* 3.1* 3.7 3.3* 3.2* 3.7 3.8  CL 120* 117* 116* 111 109 108 106  CO2 19* 21* _0 GLUCOSE  94 107* 100* 126* 115* 124* 112*  BUN 29* 26* _1 24* 30*  CREATININE 0.93 0.95 0.90 0.84 0.88 0.82 0.90  CALCIUM 8.1* 7.9* 8.1* 8.4* 8.2* 8.7* 8.8*  MG 2.5* 2.3 2.2 2.1  --   --  2.2  PHOS 3.9 3.3 3.4 2.9 3.3 3.6 3.8   GFR: Estimated Creatinine Clearance: 94.6 mL/min (by C-G formula based on SCr of 0.9 mg/dL). Liver Function Tests: Recent Labs  Lab 10/02/18 1054 10/03/18 0318 10/04/18 0545 10/05/18 0423  ALBUMIN 2.5* 2.3* 2.5* 2.6*   No results for input(s): LIPASE, AMYLASE in the last 168 hours. No results for input(s): AMMONIA in the last 168 hours. Coagulation Profile: No results for input(s): INR, PROTIME in the last 168 hours. Cardiac Enzymes: No results for input(s): CKTOTAL, CKMB, CKMBINDEX, TROPONINI in the last 168 hours. BNP (last 3 results) No results for input(s): PROBNP in the last 8760 hours. HbA1C: No results for input(s): HGBA1C in the last 72 hours. CBG: Recent Labs  Lab 10/05/18 0017 10/05/18 0403 10/05/18 0749 10/05/18 1124 10/05/18 1636  GLUCAP 122* 120* 125* 140* 112*   Lipid Profile: No results for input(s): CHOL, HDL, LDLCALC, TRIG, CHOLHDL, LDLDIRECT in the last 72 hours. Thyroid Function Tests: No results for input(s): TSH, T4TOTAL, FREET4, T3FREE, THYROIDAB in the last 72 hours. Anemia Panel: No results for input(s): VITAMINB12, FOLATE, FERRITIN, TIBC, IRON, RETICCTPCT in the last 72 hours. Urine analysis:    Component Value Date/Time   COLORURINE YELLOW 09/18/2018 0813   APPEARANCEUR CLEAR 09/18/2018 0813   LABSPEC 1.023 09/18/2018 0813   PHURINE 5.0 09/18/2018 0813   GLUCOSEU NEGATIVE 09/18/2018 0813   HGBUR LARGE (A) 09/18/2018 0813   BILIRUBINUR NEGATIVE 09/18/2018 0813   KETONESUR 5 (A) 09/18/2018 0813   PROTEINUR 100 (A) 09/18/2018 0813   NITRITE NEGATIVE 09/18/2018 0813   LEUKOCYTESUR NEGATIVE 09/18/2018 0813   Sepsis Labs: _2 (procalcitonin:4,lacticidven:4)  ) Recent Results (from the past 240 hour(s))   Culture, respiratory (non-expectorated)     Status: None   Collection Time: 09/26/18 10:21 AM  Result Value Ref Range Status   Specimen Description TRACHEAL ASPIRATE  Final   Special Requests Normal  Final   Gram Stain   Final    MODERATE WBC PRESENT, PREDOMINANTLY PMN FEW GRAM POSITIVE COCCI    Culture   Final    MODERATE Consistent with normal respiratory flora. Performed at [facility], [place] [address] [place], [place] [address]    Report Status 09/28/2018 FINAL  Final  Culture, blood (Routine X 2) w Reflex to ID Panel     Status: None   Collection Time: 09/26/18 10:45 AM  Result Value Ref Range Status   Specimen Description BLOOD LEFT ANTECUBITAL  Final   Special Requests  Final    BOTTLES DRAWN AEROBIC ONLY Blood Culture adequate volume   Culture   Final    NO GROWTH 5 DAYS Performed at [facility], [place] [address], [place], [place] [address]    Report Status 10/01/2018 FINAL  Final  Culture, blood (Routine X 2) w Reflex to ID Panel     Status: None   Collection Time: 09/26/18 10:46 AM  Result Value Ref Range Status   Specimen Description BLOOD LEFT HAND  Final   Special Requests   Final    BOTTLES DRAWN AEROBIC ONLY Blood Culture adequate volume   Culture   Final    NO GROWTH 5 DAYS Performed at [facility], [place] [address] [place], [place] [address]    Report Status 10/01/2018 FINAL  Final  Culture, Urine     Status: None   Collection Time: 09/26/18 11:58 AM  Result Value Ref Range Status   Specimen Description URINE, RANDOM  Final   Special Requests NONE  Final   Culture   Final    NO GROWTH Performed at [facility], [place] [address], [place],  [address]    Report Status 09/27/2018 FINAL  Final         Radiology Studies: Dg Chest Port 1 View  Result Date: 10/04/2018 CLINICAL DATA:  Pneumonia EXAM: PORTABLE CHEST 1 VIEW COMPARISON:  10/02/2018 FINDINGS: Diffuse infiltrate throughout the left lung shows mild interval  improvement. Right lung remains clear. No effusion. Dual lead pacemaker unchanged.  Feeding tube enters the stomach. IMPRESSION: Improvement in diffuse infiltrate throughout the left lung. Electronically Signed   By: Franchot Gallo M.D.   On: 10/04/2018 11:17        Scheduled Meds: . benztropine  1 mg Oral Daily  . [START ON 10/06/2018] buPROPion  300 mg Oral Daily  . busPIRone  15 mg Oral Daily  . chlorhexidine gluconate (MEDLINE KIT)  15 mL Mouth Rinse BID  . citalopram  40 mg Oral Daily  . free water  30 mL Per Tube Q4H  . heparin  5,000 Units Subcutaneous Q8H  . pantoprazole sodium  40 mg Per Tube Q1200  . polyethylene glycol  17 g Oral Daily  . risperiDONE  2 mg Per Tube TID  . torsemide  20 mg Oral Daily   Continuous Infusions: . sodium chloride    . sodium chloride Stopped (10/03/18 0300)  . feeding supplement (VITAL AF 1.2 CAL) 1,500 mL (10/05/18 1124)     LOS: 21 days   Time spent: 25 minutes.  Dana Allan, MD  [facility] Pager #: [phone] 7PM-7AM contact night coverage as above

## 2018-10-05 NOTE — Progress Notes (Signed)
Physical Therapy Treatment Patient Details Name: Billy Simon MRN: [id number] DOB: 06/02/1956 Today's Date: 10/05/2018    History of Present Illness Pt is a 62 y.o. male admitted 09/14/18 with complete heart block. S/p heart cath and temporary pacermaker placement 10/15. ETT 10/15-10/17, reintubated 10/17-10/24. S/p permanent pacer placed 10/17. Hospital course complicated by AKI, electrolyte abnormality, severe agitation and acute encephalopathy that is resolving slowly. PMH includes schizoaffective disorder, syncope.   PT Comments    Pt progressing with mobility and activity tolerance. Able to perform sit<>stands with minA and amb 5' x3 trials with RW and min-modA to maintain balance; required seated rest breaks in between trials. SpO2 >90% on RA. Pt with apparent slowed processing, requiring frequent cues for safety and technique throughout session. Wife present and supportive. Continue to recommend intensive CIR-level therapies to maximize functional mobility and independence prior to return home.   Follow Up Recommendations  CIR;Supervision/Assistance - 24 hour     Equipment Recommendations  (TBD)    Recommendations for Other Services Rehab consult     Precautions / Restrictions Precautions Precautions: Fall;ICD/Pacemaker Precaution Comments: Cortrak Restrictions Weight Bearing Restrictions: No    Mobility  Bed Mobility Overal bed mobility: Needs Assistance Bed Mobility: Supine to Sit     Supine to sit: Supervision     General bed mobility comments: Increased time and effort; fatigued  Transfers Overall transfer level: Needs assistance Equipment used: Rolling walker (2 wheeled) Transfers: Sit to/from Stand Sit to Stand: Min assist         General transfer comment: Performed 4x sit<>stand with RW, requiring minA for trunk elevation and to steady upon standing. Pt required repeated cues every trial for correct hand placement; poor eccentric control into  sitting  Ambulation/Gait Ambulation/Gait assistance: Mod assist;Min assist Gait Distance (Feet): 5 Feet(+5+5) Assistive device: Rolling walker (2 wheeled) Gait Pattern/deviations: Step-to pattern;Trunk flexed;Leaning posteriorly Gait velocity: Decreased Gait velocity interpretation: <1.31 ft/sec, indicative of household ambulator General Gait Details: Took steps to chair, requiring seated rest break. Amb 5' forwards/backwards x3 trials with seated rest break between trials due to fatigue and DOE. Pt moving very slowly, requiring frequent cues for safety and RW navigation. Posterior lean requiring modA to prevent LOB and max cues   Stairs             Wheelchair Mobility    Modified Rankin (Stroke Patients Only)       Balance Overall balance assessment: Needs assistance Sitting-balance support: No upper extremity supported;Feet supported Sitting balance-Leahy Scale: Fair       Standing balance-Leahy Scale: Poor Standing balance comment: reliant on RW and other external support                            Cognition Arousal/Alertness: Awake/alert Behavior During Therapy: Flat affect Overall Cognitive Status: Impaired/Different from baseline Area of Impairment: Attention;Following commands;Safety/judgement;Problem solving                   Current Attention Level: Sustained   Following Commands: Follows one step commands with increased time;Follows multi-step commands inconsistently Safety/Judgement: Decreased awareness of safety;Decreased awareness of deficits   Problem Solving: Slow processing;Decreased initiation;Requires verbal cues General Comments: Pt using minimal words throughout session. Slow to respond and follow commands at times. Wife present and also interjecting multiple times, so unsure if his response affected by this.       Exercises General Exercises - Lower Extremity Long Arc Quad: AROM;Both;Seated Hip Flexion/Marching:  AROM;Both;Seated  General Comments General comments (skin integrity, edema, etc.): Wife present, wanted to be involved. SpO2 >90% on RA      Pertinent Vitals/Pain Pain Assessment: No/denies pain    Home Living                      Prior Function            PT Goals (current goals can now be found in the care plan section) Acute Rehab PT Goals Patient Stated Goal: To drink some water PT Goal Formulation: With patient Time For Goal Achievement: 10/13/18 Potential to Achieve Goals: Good Progress towards PT goals: Progressing toward goals    Frequency    Min 3X/week      PT Plan Current plan remains appropriate    Co-evaluation              AM-PAC PT "6 Clicks" Daily Activity  Outcome Measure  Difficulty turning over in bed (including adjusting bedclothes, sheets and blankets)?: A Little Difficulty moving from lying on back to sitting on the side of the bed? : A Little Difficulty sitting down on and standing up from a chair with arms (e.g., wheelchair, bedside commode, etc,.)?: Unable Help needed moving to and from a bed to chair (including a wheelchair)?: A Little Help needed walking in hospital room?: A Little Help needed climbing 3-5 steps with a railing? : A Lot 6 Click Score: 15    End of Session Equipment Utilized During Treatment: Gait belt Activity Tolerance: Patient tolerated treatment well Patient left: in chair;with call bell/phone within reach;with family/visitor present Nurse Communication: Mobility status PT Visit Diagnosis: Other abnormalities of gait and mobility (R26.89);Muscle weakness (generalized) (M62.81)     Time: 1610-9604 PT Time Calculation (min) (ACUTE ONLY): 25 min  Charges:  $Gait Training: 8-22 mins $Therapeutic Activity: 8-22 mins                    Billy Simon, PT, DPT Acute Rehabilitation Services  Pager [phone] Office [phone]  Malachy Chamber 10/05/2018, 11:06 AM

## 2018-10-05 NOTE — Progress Notes (Signed)
  Speech Language Pathology Treatment: Dysphagia  Patient Details Name: Billy Simon MRN: [id number] DOB: 02-02-1956 Today's Date: 10/05/2018 Time: 1203-1227 SLP Time Calculation (min) (ACUTE ONLY): 24 min  Assessment / Plan / Recommendation Clinical Impression  Pt presented with improved cognition and motivation toward achieving pharyngeal strengthening goals/exercises today. He exhibited increased vocal intensity throughout session with minimal intermittent verbal cues from the clinician. Pt and wife reported attempts to practice with EMST device since last visit, however difficulty with its use. With [place] device set to 20 and 17 cm H2O resistance, pt exhibited reduced labial seal with exhalations escaping oral cavity even given max verbal/visual cues from clinician. Once adjusted to 15 cm H2O resistance, pt completed 10 reps EMST exercises with min verbal/visual cues. SLP educated pt and family regarding adjustments to the EMST device, using manual assist to achieve greater labial seal, and advised to practice at both 15 and 17-20cm H2O to produce appropriate challenge. Improved execution of masako swallow maneuver evidenced with mod verbal/visual cues today; handout also provided for future reference. During uprgraded ice chip trials, pt exhibited immediate throat clear with 2 out of 8 presentations. Given pt's improvements in overall medical stability, cognition, and suspected pharyngeal strength, SLP will reassess swallow function with objective MBSS tomorrow. Recommend continue NPO for now except for 1-5 ice chips with FULL supervision/assist and only AFTER oral care. ST wtill continue to follow with acute intervention.    HPI HPI: 62 year old with a history of syncope who presented with complete heart block requiring intubation 10/15-10/16, reintubated 10/17-10/29 for pacemaker insertion. Pt hypoxic hypercarbic and acidotic in cath labl for right femoral temporary pacing wire placement.  Exhibited severe schizoaffective disorder, severe agitation, acute encephalopathy. CT head 10/24 > nil acute, mastoid effusion+, age advanced cerebral atrophy +      SLP Plan  Continue with current plan of care;MBS       Recommendations  Diet recommendations: NPO;Other(comment)(1-5 ice chips with oral care before) Medication Administration: Via alternative means Supervision: Full supervision/cueing for compensatory strategies;Trained caregiver to feed patient Postural Changes and/or Swallow Maneuvers: Seated upright 90 degrees                Oral Care Recommendations: Oral care QID;Oral care prior to ice chip/H20 Follow up Recommendations: Inpatient Rehab SLP Visit Diagnosis: Dysphagia, unspecified (R13.10) Plan: Continue with current plan of care;MBS       Suzzette Righter, Student SLP                 Suzzette Righter 10/05/2018, 2:04 PM

## 2018-10-06 ENCOUNTER — Inpatient Hospital Stay (HOSPITAL_COMMUNITY): Payer: PRIVATE HEALTH INSURANCE

## 2018-10-06 DIAGNOSIS — R1310 Dysphagia, unspecified: Secondary | ICD-10-CM

## 2018-10-06 DIAGNOSIS — D72829 Elevated white blood cell count, unspecified: Secondary | ICD-10-CM

## 2018-10-06 DIAGNOSIS — F251 Schizoaffective disorder, depressive type: Secondary | ICD-10-CM

## 2018-10-06 DIAGNOSIS — R5381 Other malaise: Secondary | ICD-10-CM

## 2018-10-06 LAB — CBC WITH DIFFERENTIAL/PLATELET
Abs Immature Granulocytes: 0.25 10*3/uL — ABNORMAL HIGH (ref 0.00–0.07)
Basophils Absolute: 0.1 10*3/uL (ref 0.0–0.1)
Basophils Relative: 1 %
Eosinophils Absolute: 0.3 10*3/uL (ref 0.0–0.5)
Eosinophils Relative: 3 %
HCT: 37.4 % — ABNORMAL LOW (ref 39.0–52.0)
Hemoglobin: 11.8 g/dL — ABNORMAL LOW (ref 13.0–17.0)
Immature Granulocytes: 2 %
Lymphocytes Relative: 13 %
Lymphs Abs: 1.5 10*3/uL (ref 0.7–4.0)
MCH: 29.1 pg (ref 26.0–34.0)
MCHC: 31.6 g/dL (ref 30.0–36.0)
MCV: 92.1 fL (ref 80.0–100.0)
Monocytes Absolute: 0.9 10*3/uL (ref 0.1–1.0)
Monocytes Relative: 8 %
Neutro Abs: 8.5 10*3/uL — ABNORMAL HIGH (ref 1.7–7.7)
Neutrophils Relative %: 73 %
Platelets: 289 10*3/uL (ref 150–400)
RBC: 4.06 MIL/uL — ABNORMAL LOW (ref 4.22–5.81)
RDW: 17 % — ABNORMAL HIGH (ref 11.5–15.5)
WBC: 11.6 10*3/uL — ABNORMAL HIGH (ref 4.0–10.5)
nRBC: 0 % (ref 0.0–0.2)

## 2018-10-06 LAB — RENAL FUNCTION PANEL
Albumin: 2.7 g/dL — ABNORMAL LOW (ref 3.5–5.0)
Anion gap: 12 (ref 5–15)
BUN: 34 mg/dL — ABNORMAL HIGH (ref 8–23)
CO2: 26 mmol/L (ref 22–32)
Calcium: 8.8 mg/dL — ABNORMAL LOW (ref 8.9–10.3)
Chloride: 102 mmol/L (ref 98–111)
Creatinine, Ser: 0.89 mg/dL (ref 0.61–1.24)
GFR calc Af Amer: >60 mL/min (ref >60)
GFR calc non Af Amer: >60 mL/min (ref >60)
Glucose, Bld: 130 mg/dL — ABNORMAL HIGH (ref 70–99)
Phosphorus: 3.7 mg/dL (ref 2.5–4.6)
Potassium: 3.7 mmol/L (ref 3.5–5.1)
Sodium: 140 mmol/L (ref 135–145)

## 2018-10-06 LAB — GLUCOSE, CAPILLARY
GLUCOSE-CAPILLARY: 125 mg/dL — AB (ref 70–99)
Glucose-Capillary: 104 mg/dL — ABNORMAL HIGH (ref 70–99)
Glucose-Capillary: 108 mg/dL — ABNORMAL HIGH (ref 70–99)
Glucose-Capillary: 119 mg/dL — ABNORMAL HIGH (ref 70–99)
Glucose-Capillary: 122 mg/dL — ABNORMAL HIGH (ref 70–99)

## 2018-10-06 NOTE — Care Management Note (Signed)
Case Management Note  Patient Details  Name: Billy Simon MRN: [id number] Date of Birth: 05-Jan-1956  Subjective/Objective: Pt presented for Complete Heart Block. PTA from home with support of wife.  Treated for HCAP- has cortrak in place. Nutrition and Speech following.                 Action/Plan: CM did speak with patient and wife in regards to plan of care. Patient's insurance is not the best for SNF vs CIR. Family will have to pay out of pocket. Wife has not been able to make a decision in regards to Disposition at this time. Wife wanted to know cost of SNF out of pocket vs Rehab cost. Tresa Endo Rehab Admissions Coordinator did provide cost for services for inpatient rehab. CM did call CSW to check on cost for SNF out of pocket. CM did make pt and wife aware that if we do not have a decision the Inpatient Rehab bed may be lost and options will be home health vs SNF. CM will continue to monitor for additional needs.   Expected Discharge Date:                  Expected Discharge Plan:  IP Rehab Facility  In-House Referral:  NA  Discharge planning Services  CM Consult  Post Acute Care Choice:  NA Choice offered to:  NA  DME Arranged:  N/A DME Agency:  NA  HH Arranged:  NA HH Agency:  NA  Status of Service:  In process, will continue to follow  If discussed at Long Length of Stay Meetings, dates discussed:    Additional Comments:  Gala Lewandowsky, RN 10/06/2018, 3:06 PM

## 2018-10-06 NOTE — NC FL2 (Signed)
[place] LEVEL OF CARE SCREENING TOOL     IDENTIFICATION  Patient Name: Billy Simon Birthdate: 1955/12/31 Sex: male Admission Date (Current Location): 09/14/2018  [facility] and [place] Number:  Herbalist and Address:  [facility] [place], [place] [address], [place], [place] [address]      Provider Number: [id number]  Attending Physician Name and Address:  Bonnell Public, MD  Relative Name and Phone Number:  Younes Degeorge, spouse, [phone]    Current Level of Care: Hospital Recommended Level of Care: [place] Prior Approval Number:    Date Approved/Denied:   PASRR Number: [id number]  Discharge Plan: SNF    Current Diagnoses: Patient Active Problem List   Diagnosis Date Noted  . Delirium due to another medical condition   . Pressure injury of skin 09/27/2018  . Encephalopathy acute   . CHB (complete heart block) ([place]) 09/14/2018  . Complete heart block ([place]) 09/14/2018    Orientation RESPIRATION BLADDER Height & Weight     Self, Place  Normal Continent Weight: 88.2 kg Height:  6' (182.9 cm)  BEHAVIORAL SYMPTOMS/MOOD NEUROLOGICAL BOWEL NUTRITION STATUS      Continent Feeding tube, Diet(currently has NG tube, will advance diet)  AMBULATORY STATUS COMMUNICATION OF NEEDS Skin   Limited Assist Verbally Surgical wounds, Other (Comment)(closed incision L chest, non PU L back, non PU R leg)                       Personal Care Assistance Level of Assistance  Bathing, Feeding, Dressing Bathing Assistance: Limited assistance Feeding assistance: Independent Dressing Assistance: Limited assistance     Functional Limitations Info  Sight, Hearing, Speech Sight Info: Adequate Hearing Info: Adequate Speech Info: Adequate    SPECIAL CARE FACTORS FREQUENCY  PT (By licensed PT), OT (By licensed OT), Speech therapy     PT Frequency: 5x/week OT Frequency: 5x/week     Speech Therapy Frequency: please  see DC summary      Contractures Contractures Info: Not present    Additional Factors Info  Code Status, Allergies, Isolation Precautions, Psychotropic Code Status Info: Full Allergies Info: No Known Allergies Psychotropic Info: celexa, risperdal   Isolation Precautions Info: Conact precautions     Current Medications (10/06/2018):  This is the current hospital active medication list Current Facility-Administered Medications  Medication Dose Route Frequency Provider Last Rate Last Dose  . 0.9 %  sodium chloride infusion  250 mL Intravenous PRN Martinique, Peter M, MD      . 0.9 %  sodium chloride infusion  250 mL Intravenous PRN Barrett, Evelene Croon, PA-C   Stopped at 10/03/18 0300  . ALPRAZolam Duanne Moron) tablet 0.5 mg  0.5 mg Oral BID PRN Rigoberto Noel, MD   0.5 mg at 10/02/18 2147  . benztropine (COGENTIN) tablet 1 mg  1 mg Oral Daily Aroor, Karena Addison R, MD   1 mg at 10/06/18 1019  . bisacodyl (DULCOLAX) EC tablet 5 mg  5 mg Oral Daily PRN Rigoberto Noel, MD      . bisacodyl (DULCOLAX) suppository 10 mg  10 mg Rectal Daily PRN Rigoberto Noel, MD   10 mg at 09/29/18 1840  . buPROPion (WELLBUTRIN) tablet 300 mg  300 mg Oral Daily Dana Allan I, MD   300 mg at 10/06/18 1020  . busPIRone (BUSPAR) tablet 15 mg  15 mg Oral Daily Rigoberto Noel, MD   15 mg at 10/06/18 1019  .  chlorhexidine gluconate (MEDLINE KIT) (PERIDEX) 0.12 % solution 15 mL  15 mL Mouth Rinse BID Martinique, Peter M, MD   15 mL at 10/05/18 2000  . citalopram (CELEXA) tablet 40 mg  40 mg Oral Daily Brand Males, MD   40 mg at 10/06/18 1019  . feeding supplement (VITAL AF 1.2 CAL) liquid 1,500 mL  1,500 mL Per Tube Continuous Rigoberto Noel, MD 80 mL/hr at 10/06/18 0604 1,500 mL at 10/06/18 0604  . free water 30 mL  30 mL Per Tube Q4H Dana Allan I, MD   30 mL at 10/06/18 1235  . haloperidol lactate (HALDOL) injection 1-4 mg  1-4 mg Intravenous Q3H PRN Rigoberto Noel, MD   4 mg at 09/29/18 2239  . heparin injection  5,000 Units  5,000 Units Subcutaneous Q8H Martinique, Peter M, MD   5,000 Units at 10/06/18 0603  . ondansetron (ZOFRAN) injection 4 mg  4 mg Intravenous Q6H PRN Barrett, Rhonda G, PA-C      . pantoprazole sodium (PROTONIX) 40 mg/20 mL oral suspension 40 mg  40 mg Per Tube Q1200 Rigoberto Noel, MD   40 mg at 10/05/18 1056  . polyethylene glycol (MIRALAX / GLYCOLAX) packet 17 g  17 g Oral Daily Minor, Grace Bushy, NP   17 g at 10/05/18 0802  . risperiDONE (RISPERDAL) 1 MG/ML oral solution 2 mg  2 mg Per Tube TID Dana Allan I, MD   2 mg at 10/06/18 1020  . torsemide (DEMADEX) tablet 20 mg  20 mg Oral Daily Dana Allan I, MD   20 mg at 10/06/18 1020     Discharge Medications: Please see discharge summary for a list of discharge medications.  Relevant Imaging Results:  Relevant Lab Results:   Additional Information SSN: [id number]  [name], LCSW

## 2018-10-06 NOTE — Progress Notes (Signed)
Physical Therapy Treatment Patient Details Name: Billy Simon MRN: [id number] DOB: 01-15-1956 Today's Date: 10/06/2018    History of Present Illness Pt is a 62 y.o. male admitted 09/14/18 with complete heart block. S/p heart cath and temporary pacermaker placement 10/15. ETT 10/15-10/17, reintubated 10/17-10/24. S/p permanent pacer placed 10/17. Hospital course complicated by AKI, electrolyte abnormality, severe agitation and acute encephalopathy that is resolving slowly. PMH includes schizoaffective disorder, syncope.   PT Comments    Pt progressing with mobility. Able to increase ambulation distance with seated breaks using RW; requires minA to stand and maintain balance while walking. Demonstrates slowed processing, decreased safety awareness, and poor balance strategies. C/o dizziness upon standing which subsided (see BP values below). Continue to recommend intensive CIR-level therapies. If CIR is not an option, recommend SNF prior to return home.  Supine BP 117/77 Seated BP 101/70 Standing BP 107/84    Follow Up Recommendations  CIR;Supervision/Assistance - 24 hour     Equipment Recommendations  (TBD)    Recommendations for Other Services       Precautions / Restrictions Precautions Precautions: Fall;ICD/Pacemaker Precaution Comments: Cortrak Restrictions Weight Bearing Restrictions: No    Mobility  Bed Mobility Overal bed mobility: Needs Assistance Bed Mobility: Supine to Sit;Sit to Supine     Supine to sit: Supervision Sit to supine: Supervision   General bed mobility comments: Increased time and effort  Transfers Overall transfer level: Needs assistance Equipment used: Rolling walker (2 wheeled) Transfers: Sit to/from Stand Sit to Stand: Min assist         General transfer comment: Completed 5x sit<>stand during session from EOB and chair with arm rests, requiring minA for trunk elevation and to maintain balance. Pt required cues each trial for correct  hand placement and cues for eccentric lowering  Ambulation/Gait Ambulation/Gait assistance: Min assist Gait Distance (Feet): 6 Feet     Gait velocity: Decreased Gait velocity interpretation: <1.8 ft/sec, indicate of risk for recurrent falls General Gait Details: Amb 6' with seated rest, then additional 65' with seated rest, using RW and minA for balance. Pt with significant posterior lean when turning and walking backwards requiring max cues and minA to prevent posterior LOB. Pt also slightly impulsive with movement (seems he is tired so tries to walk quickly to get it over with), despite cues for safety   Stairs             Wheelchair Mobility    Modified Rankin (Stroke Patients Only)       Balance Overall balance assessment: Needs assistance Sitting-balance support: No upper extremity supported;Feet supported Sitting balance-Leahy Scale: Fair Sitting balance - Comments: donned socks sitting EOB   Standing balance support: Bilateral upper extremity supported Standing balance-Leahy Scale: Poor Standing balance comment: Reliant on UE support                            Cognition Arousal/Alertness: Awake/alert Behavior During Therapy: Flat affect Overall Cognitive Status: Impaired/Different from baseline Area of Impairment: Attention;Following commands;Safety/judgement;Problem solving;Awareness                   Current Attention Level: Sustained Memory: Decreased recall of precautions;Decreased short-term memory Following Commands: Follows one step commands consistently;Follows one step commands with increased time Safety/Judgement: Decreased awareness of safety;Decreased awareness of deficits Awareness: Emergent Problem Solving: Slow processing;Requires verbal cues General Comments: Minimal verbalizations, requiring cues to speak up; but using voice more at end of session. Sometimes delayed responses/slowed processing(?),  but also just kind of flat  overall      Exercises      General Comments General comments (skin integrity, edema, etc.): Wife and pastor present. Orthostatic vitals negative      Pertinent Vitals/Pain Pain Assessment: No/denies pain Pain Intervention(s): Monitored during session    Home Living                      Prior Function            PT Goals (current goals can now be found in the care plan section) Acute Rehab PT Goals Patient Stated Goal: To drink some water PT Goal Formulation: With patient Time For Goal Achievement: 10/13/18 Potential to Achieve Goals: Good Progress towards PT goals: Progressing toward goals    Frequency    Min 3X/week      PT Plan Current plan remains appropriate    Co-evaluation              AM-PAC PT "6 Clicks" Daily Activity  Outcome Measure  Difficulty turning over in bed (including adjusting bedclothes, sheets and blankets)?: A Little Difficulty moving from lying on back to sitting on the side of the bed? : A Little Difficulty sitting down on and standing up from a chair with arms (e.g., wheelchair, bedside commode, etc,.)?: Unable Help needed moving to and from a bed to chair (including a wheelchair)?: A Little Help needed walking in hospital room?: A Little Help needed climbing 3-5 steps with a railing? : A Lot 6 Click Score: 15    End of Session Equipment Utilized During Treatment: Gait belt Activity Tolerance: Patient tolerated treatment well Patient left: in bed;with call bell/phone within reach;with bed alarm set;with family/visitor present Nurse Communication: Mobility status PT Visit Diagnosis: Other abnormalities of gait and mobility (R26.89);Muscle weakness (generalized) (M62.81)     Time: 1610-9604 PT Time Calculation (min) (ACUTE ONLY): 24 min  Charges:  $Gait Training: 8-22 mins $Therapeutic Activity: 8-22 mins                    Ina Homes, PT, DPT Acute Rehabilitation Services  Pager [phone] Office  [phone]  Malachy Chamber 10/06/2018, 3:53 PM

## 2018-10-06 NOTE — Progress Notes (Signed)
Modified Barium Swallow Progress Note  Patient Details  Name: Billy Simon MRN: [id number] Date of Birth: Jun 27, 1956  Today's Date: 10/06/2018  Modified Barium Swallow completed.  Full report located under Chart Review in the Imaging Section.  Brief recommendations include the following:  Clinical Impression  Although moderate oropharyngeal dysphagia still evident, pt exhibits improved pharygneal strength and overall swallow function in comparison to last objective test (09/29/18). Oral deficits include lingual pumping and mild lingual residue with honey thick liquids and puree, however primary impairments remain pharyngeal in nature. Significantly reduced epiglottic invsersion lead to penetration of honey thick liquids to the level of the vocal folds in 4 out of 6 trials of bolus delivered from a cup. Chin tuck compensatory maneuver attempted, however ineffective to provide additional airway protection. Penetration into the laryngeal vestibule (remained above vocal folds) was observed in only 1 out of 3 trials of honey thick when delivered via teaspoon. Smaller bolus volume believed to greatly benefit pt's airway protection during swallow and also resulted in reduced (mild as opposed to moderate) vallecular and pyriform residue in comparison to larger boluses consumed from a cup. Pt's penetration of all POs remained silent throughout the study, however cued attempts to clear throat followed by a hard cough were effective to clear most penetrates consistently. Pt will be able to achieve increased epiglottic deflection once Cortrak is removed. SLP likely to initiate diet/liquids in the next 24 hours, however pt and wife require further education/training prior to initiating po's on a consistent basis. Pt must strictly adhere to precautions with puree and honey thick liquids to include small amounts via tsp, volitional cough after each swallow, multiple dry swallows. Continue NPO using Cortrak and SLP  intervention with aformentiond recommendations/strategies with good prognosis to begin a diet once education and training complete.   Swallow Evaluation Recommendations       SLP Diet Recommendations: NPO;Alternative means - temporary;Other (Comment)(trials with ST only initially). ST trials with tsp honey thick and puree.       Medication Administration: Via alternative means               Oral Care Recommendations: Oral care QID        [name] 10/06/2018,12:06 PM   [name].Ed Nurse, children's [phone] Office [phone]

## 2018-10-06 NOTE — Progress Notes (Signed)
Inpatient Rehabilitation-Admissions Coordinator   Pt's family has yet to make a decision regarding CIR vs facility closer to home. Plan to follow up around 2pm for final decision. Discussed this with CM.   Please call if questions.   Nanine Means, OTR/L  Rehab Admissions Coordinator  [phone] 10/06/2018 12:42 PM

## 2018-10-06 NOTE — Progress Notes (Signed)
Occupational Therapy Treatment Patient Details Name: Billy Simon MRN: [id number] DOB: [birth year] Today's Date: 10/06/2018    History of present illness Pt is a 62 y.o. male admitted 09/14/18 with complete heart block. S/p heart cath and temporary pacermaker placement 10/15. ETT 10/15-10/17, reintubated 10/17-10/24. S/p permanent pacer placed 10/17. Hospital course complicated by AKI, electrolyte abnormality, severe agitation and acute encephalopathy that is resolving slowly. PMH includes schizoaffective disorder, syncope.   OT comments  Pt progressing towards acute OT goals. Focus of session was simulated toilet transfer, LB dressing. Pt also sat EOB several minutes. Pt with dizziness once EOB/OOB that improved with return to sitting with feet elevated at end of session. BP assessed as follows: 113/71 (upon sitting in recliner), 122/76 after 3 minutes in recliner. Pt following 1-step commands consistently, some delay noted with speech at times. Minimal verbalization but content appropriate. Pt left sitting up in recliner. Spouse present throughout session. D/c plan remains appropriate.   Follow Up Recommendations  CIR    Equipment Recommendations  3 in 1 bedside commode    Recommendations for Other Services      Precautions / Restrictions Precautions Precautions: Fall;ICD/Pacemaker Precaution Comments: Cortrak Restrictions Weight Bearing Restrictions: No       Mobility Bed Mobility Overal bed mobility: Needs Assistance Bed Mobility: Supine to Sit     Supine to sit: Supervision     General bed mobility comments: Increased time and effort. Supervision for safety.   Transfers Overall transfer level: Needs assistance Equipment used: Rolling walker (2 wheeled) Transfers: Sit to/from Stand Sit to Stand: Min assist;Mod assist         General transfer comment: Pt completed sit<>stand from EOB 2x, to recliner 1x. Assist to powerup from regular height and to control descent into  seated position.    Balance Overall balance assessment: Needs assistance Sitting-balance support: No upper extremity supported;Feet supported Sitting balance-Leahy Scale: Fair Sitting balance - Comments: donned socks sitting EOB   Standing balance support: Bilateral upper extremity supported Standing balance-Leahy Scale: Poor Standing balance comment: reliant on rw                            ADL either performed or assessed with clinical judgement   ADL Overall ADL's : Needs assistance/impaired                     Lower Body Dressing: Moderate assistance;Sit to/from stand Lower Body Dressing Details (indicate cue type and reason): donned socks sitting EOB, mod A for sit<>stand transfer Toilet Transfer: Minimal assistance;Moderate assistance;Stand-pivot;BSC;RW;Cueing for safety Toilet Transfer Details (indicate cue type and reason): simulated with EOB>recliner. Assist needed to powerup and control descent into chair. + Dizziness, quick to sit down.          Functional mobility during ADLs: Minimal assistance;Rolling walker(pivotal steps in room) General ADL Comments: Pt sat EOB 4-5 minutes. Donned socks sitting EOB. + Dizziness. Pt then completed pivotal steps to recliner.      Vision       Perception     Praxis      Cognition Arousal/Alertness: Awake/alert Behavior During Therapy: Flat affect Overall Cognitive Status: Impaired/Different from baseline Area of Impairment: Attention;Following commands;Safety/judgement;Problem solving                   Current Attention Level: Sustained Memory: Decreased recall of precautions;Decreased short-term memory Following Commands: Follows one step commands consistently Safety/Judgement: Decreased awareness of safety;Decreased awareness of  deficits Awareness: Intellectual Problem Solving: Slow processing;Requires verbal cues General Comments: Able to state place, year, DOB. Minimal verbalizations,  initial cueing for speaking louder. Asleep upon arrival of OT, more alert once EOB. Answering basic questions with appropriate content. Delayed verbal responses at times.        Exercises     Shoulder Instructions       General Comments Wife present and involved in session.    Pertinent Vitals/ Pain       Pain Assessment: Faces Faces Pain Scale: No hurt Pain Intervention(s): Monitored during session  Home Living                                          Prior Functioning/Environment              Frequency  Min 2X/week        Progress Toward Goals  OT Goals(current goals can now be found in the care plan section)  Progress towards OT goals: Progressing toward goals  Acute Rehab OT Goals Patient Stated Goal: To drink some water OT Goal Formulation: With patient Time For Goal Achievement: 10/15/18 Potential to Achieve Goals: Good ADL Goals Pt Will Perform Eating: with set-up;sitting(once allowed to eat/drink) Pt Will Perform Grooming: with supervision;with set-up;sitting Pt Will Perform Upper Body Dressing: with min assist;sitting Pt Will Perform Lower Body Dressing: with mod assist;sit to/from stand Pt Will Transfer to Toilet: with mod assist;ambulating;bedside commode Additional ADL Goal #1: Pt will follow 2 step commands with 50% accuracy.  Plan Discharge plan remains appropriate    Co-evaluation                 AM-PAC PT "6 Clicks" Daily Activity     Outcome Measure   Help from another person eating meals?: (NPO) Help from another person taking care of personal grooming?: A Lot Help from another person toileting, which includes using toliet, bedpan, or urinal?: A Lot Help from another person bathing (including washing, rinsing, drying)?: A Lot Help from another person to put on and taking off regular upper body clothing?: A Lot Help from another person to put on and taking off regular lower body clothing?: A Lot 6 Click Score:  10    End of Session Equipment Utilized During Treatment: Rolling walker  OT Visit Diagnosis: Unsteadiness on feet (R26.81);Muscle weakness (generalized) (M62.81);Other symptoms and signs involving cognitive function;Cognitive communication deficit (R41.841)   Activity Tolerance Other (comment)(dizziness EOB/OOB)   Patient Left with call bell/phone within reach;in chair;with family/visitor present   Nurse Communication Other (comment)(dizziness, no chair alarm (covering nurse))        Time: 4098-1191 OT Time Calculation (min): 31 min  Charges: OT General Charges $OT Visit: 1 Visit OT Treatments $Self Care/Home Management : 23-37 mins  Raynald Kemp, OT Acute Rehabilitation Services Pager: [phone] Office: [phone]    Pilar Grammes 10/06/2018, 11:40 AM

## 2018-10-06 NOTE — Progress Notes (Signed)
[place] PHYSICAL MEDICINE & REHABILITATION PROGRESS NOTE  Subjective/Complaints: Patient seen laying in bed this morning.  Wife at bedside and answers most questions.  Patient states he feels he is getting stronger.  ROS: Denies CP, shortness of breath, nausea, vomiting, diarrhea.  Objective: Vital Signs: Blood pressure 119/75, pulse 94, temperature 97.9 F (36.6 C), temperature source Oral, resp. rate 18, height 6' (1.829 m), weight 88.2 kg, SpO2 96 %. Dg Swallowing Func-speech Pathology  Result Date: 10/06/2018 Objective Swallowing Evaluation: Type of Study: MBS-Modified Barium Swallow Study  Patient Details Name: Billy Simon MRN: [id number] Date of Birth: 1955/12/20 Today's Date: 10/06/2018 Time: SLP Start Time (ACUTE ONLY): 0830 -SLP Stop Time (ACUTE ONLY): 0854 SLP Time Calculation (min) (ACUTE ONLY): 24 min Past Medical History: Past Medical History: Diagnosis Date . CHB (complete heart block) (HCC) 09/14/2018 . Syncope 08/2018  Hospitalized at [facility] Past Surgical History: Past Surgical History: Procedure Laterality Date . LEFT HEART CATH AND CORONARY ANGIOGRAPHY N/A 09/14/2018  Procedure: LEFT HEART CATH AND CORONARY ANGIOGRAPHY;  Surgeon: Swaziland, Peter M, MD;  Location: [facility] INVASIVE CV LAB;  Service: Cardiovascular;  Laterality: N/A; . PACEMAKER IMPLANT N/A 09/16/2018  Procedure: PACEMAKER IMPLANT;  Surgeon: Regan Lemming, MD;  Location: [facility] INVASIVE CV LAB;  Service: Cardiovascular;  Laterality: N/A; . TEMPORARY PACEMAKER N/A 09/14/2018  Procedure: TEMPORARY PACEMAKER;  Surgeon: Swaziland, Peter M, MD;  Location: [facility] INVASIVE CV LAB;  Service: Cardiovascular;  Laterality: N/A; HPI: 62 year old with a history of syncope who presented with complete heart block requiring intubation 10/15-10/16, reintubated 10/17-10/29 for pacemaker insertion. Pt hypoxic hypercarbic and acidotic in cath labl for right femoral temporary pacing wire placement. Exhibited severe schizoaffective disorder,  severe agitation, acute encephalopathy. CT head 10/24 > nil acute, mastoid effusion+, age advanced cerebral atrophy +. Previous MBS 09/29/18 (1 day pos-extubation) reccomended continue NPO.  No data recorded Assessment / Plan / Recommendation CHL IP CLINICAL IMPRESSIONS 10/06/2018 Clinical Impression Although moderate oropharyngeal dysphagia still evident, pt exhibits improved pharygneal strength and overall swallow function in comparison to last objective test (09/29/18). Oral deficits include lingual pumping and mild lingual residue with honey thick liquids and puree, however primary impairments remain pharyngeal in nature. Significantly reduced epiglottic invsersion lead to penetration of honey thick liquids to the level of the vocal folds in 4 out of 6 trials of bolus delivered from a cup. Chin tuck compensatory maneuver attempted, however ineffective to provide additional airway protection. Penetration into the laryngeal vestibule (remained above vocal folds) was observed in only 1 out of 3 trials of honey thick when delivered via teaspoon. Smaller bolus volume believed to greatly benefit pt's airway protection during swallow and also resulted in reduced (mild as opposed to moderate) vallecular and pyriform residue in comparison to larger boluses consumed from a cup. Pt's penetration of all POs remained silent throughout the study, however cued attempts to clear throat followed by a hard cough were effective to clear most penetrates consistently. Pt will be able to achieve increased epiglottic deflection once Cortrak is removed. SLP likely to initiate diet/liquids in the next 24 hours, however pt and wife require further education/training prior to initiating po's on a consistent basis. Pt must strictly adhere to precautions with puree and honey thick liquids to include small amounts via tsp, volitional cough after each swallow, multiple dry swallows. Continue NPO using Cortrak and SLP intervention with  aformentiond recommendations/strategies with good prognosis to begin a diet once education and training complete. SLP Visit Diagnosis Dysphagia, oropharyngeal  phase (R13.12) Attention and concentration deficit following -- Frontal lobe and executive function deficit following -- Impact on safety and function Moderate aspiration risk;Severe aspiration risk   CHL IP TREATMENT RECOMMENDATION 10/06/2018 Treatment Recommendations Therapy as outlined in treatment plan below   Prognosis 10/06/2018 Prognosis for Safe Diet Advancement Good Barriers to Reach Goals -- Barriers/Prognosis Comment -- CHL IP DIET RECOMMENDATION 10/06/2018 SLP Diet Recommendations NPO;Alternative means - temporary;Other (Comment) Liquid Administration via -- Medication Administration Via alternative means Compensations -- Postural Changes --   CHL IP OTHER RECOMMENDATIONS 10/06/2018 Recommended Consults -- Oral Care Recommendations Oral care QID Other Recommendations --   CHL IP FOLLOW UP RECOMMENDATIONS 10/06/2018 Follow up Recommendations Skilled Nursing facility   [facility] IP FREQUENCY AND DURATION 10/06/2018 Speech Therapy Frequency (ACUTE ONLY) min 2x/week Treatment Duration 2 weeks      CHL IP ORAL PHASE 10/06/2018 Oral Phase Impaired Oral - Pudding Teaspoon -- Oral - Pudding Cup -- Oral - Honey Teaspoon Lingual/palatal residue;Lingual pumping Oral - Honey Cup Lingual pumping;Lingual/palatal residue Oral - Nectar Teaspoon -- Oral - Nectar Cup NT Oral - Nectar Straw -- Oral - Thin Teaspoon -- Oral - Thin Cup -- Oral - Thin Straw -- Oral - Puree Lingual pumping Oral - Mech Soft -- Oral - Regular WFL Oral - Multi-Consistency -- Oral - Pill -- Oral Phase - Comment --  CHL IP PHARYNGEAL PHASE 10/06/2018 Pharyngeal Phase Impaired Pharyngeal- Pudding Teaspoon -- Pharyngeal -- Pharyngeal- Pudding Cup -- Pharyngeal -- Pharyngeal- Honey Teaspoon Pharyngeal residue - pyriform;Pharyngeal residue - valleculae;Reduced epiglottic inversion;Penetration/Aspiration during  swallow Pharyngeal Material enters airway, remains ABOVE vocal cords and not ejected out Pharyngeal- Honey Cup Pharyngeal residue - valleculae;Pharyngeal residue - pyriform;Reduced epiglottic inversion;Compensatory strategies attempted (with notebox);Penetration/Aspiration during swallow;Delayed swallow initiation-vallecula Pharyngeal Material enters airway, CONTACTS cords and not ejected out Pharyngeal- Nectar Teaspoon -- Pharyngeal -- Pharyngeal- Nectar Cup NT Pharyngeal -- Pharyngeal- Nectar Straw -- Pharyngeal -- Pharyngeal- Thin Teaspoon -- Pharyngeal -- Pharyngeal- Thin Cup -- Pharyngeal -- Pharyngeal- Thin Straw -- Pharyngeal -- Pharyngeal- Puree Reduced epiglottic inversion;Delayed swallow initiation-vallecula;Pharyngeal residue - valleculae;Pharyngeal residue - pyriform Pharyngeal (No Data) Pharyngeal- Mechanical Soft -- Pharyngeal -- Pharyngeal- Regular Reduced epiglottic inversion;Penetration/Aspiration during swallow;Inter-arytenoid space residue;Pharyngeal residue - pyriform Pharyngeal Material enters airway, remains ABOVE vocal cords then ejected out;Material enters airway, passes BELOW cords then ejected out Pharyngeal- Multi-consistency -- Pharyngeal -- Pharyngeal- Pill -- Pharyngeal -- Pharyngeal Comment --  CHL IP CERVICAL ESOPHAGEAL PHASE 10/06/2018 Cervical Esophageal Phase WFL Pudding Teaspoon -- Pudding Cup -- Honey Teaspoon -- Honey Cup -- Nectar Teaspoon -- Nectar Cup -- Nectar Straw -- Thin Teaspoon -- Thin Cup -- Thin Straw -- Puree -- Mechanical Soft -- Regular -- Multi-consistency -- Pill -- Cervical Esophageal Comment -- Billy Simon 10/06/2018, 12:06 PM  Billy Simon.Ed CCC-SLP Speech-Language Pathologist Pager [phone] Office [phone]             Recent Labs    10/05/18 0423 10/06/18 0927  WBC 14.3* 11.6*  HGB 11.6* 11.8*  HCT 36.3* 37.4*  PLT 348 289   Recent Labs    10/05/18 0423 10/06/18 0927  NA 141 140  K 3.8 3.7  CL 106 102  CO2 26 26  GLUCOSE  112* 130*  BUN 30* 34*  CREATININE 0.90 0.89  CALCIUM 8.8* 8.8*    Physical Exam: BP 119/75 (BP Location: Right Arm)   Pulse 94   Temp 97.9 F (36.6 C) (Oral)   Resp 18   Ht 6' (1.829  m)   Wt 88.2 kg   SpO2 96%   BMI 26.37 kg/m  Constitutional: No distress . Vital signs reviewed. HENT: Normocephalic.  Atraumatic.  + NG. Eyes: EOMI. No discharge. Cardiovascular: RRR. No JVD. Respiratory: CTA Bilaterally. Normal effort. GI: BS +. Non-distended. Musc: No edema or tenderness in extremities. Neurologic: Alert Motor: Grossly 4/5 throughout Psych: Slowed  Assessment/Plan:   1.  Debility due to multi-medical reasons  Continue therapies  Family continues to be undecided about CIR, but appears to indicate preference to have patient closer to home at [facility]. Discussed with rehab case manager  2.  Psych  History of schizoaffective disorder, anxiety, depression  Continue medications  3.  Dysphagia  Currently n.p.o.  Continue feeding through NG  SLP, advance diet as tolerated  4.  Leukocytosis  Rrepeat labs, cont to monitor for signs and symptoms of infection, further workup if indicated   LOS: 22 days A Simon TO Simon EVALUATION WAS PERFORMED  Daiquan Resnik Karis Juba 10/06/2018, 4:19 PM

## 2018-10-06 NOTE — Progress Notes (Signed)
PROGRESS NOTE    Billy Simon  NID:[id number] DOB: [birth year] DOA: 09/14/2018 PCP: No primary care provider on file.  Outpatient Specialists:   Brief Narrative:  Patient is a 62 year old Caucasian male, with past medical history significant for depression, anxiety, syncope, was recently found to be in complete heart block requiring cardiac cath (normal coronaries) and temporary femoral pacer insertion on 09/14/2018.  As per prior documentations, patient was intubated for respiratory failure with hypoxia, hypercarbia, bloody frothy sputum / HCAP extensive infx BL.  Briefly on pressors for hypotension. Patient was extubated 10/29.   Hospital course has also been complicated by acute kidney injury, electrolyte abnormality, severe agitation necessitating use of Precedex and benzodiazepine, and acute encephalopathy that is resolving slowly.  Echo revealed normal EF, but the diastolic function could not be assessed.  Cardiac catheterization revealed mildly elevated LVEDP.  Patient's oxygen requirement is down to 1 L/min via nasal cannula, and the O2 sat is 95%.  Patient still looks weak.  Patient seems to get easily agitated.  10/02/2018: Patient is new to me.  This is my first contact with the patient.  Patient was seen alongside patient's wife, son and nurse.  Patient's family questions were answered.  Patient's wife, specifically, as for patient's dose of Xanax to be increased.  I explained to the patient's wife that we need to be careful with mind altering medications in the face of confusion and respiratory failure.  10/03/18: Patient seen alongside patient's wife and nurse.  Patient continues to improve.  Slight improvement noted in the leukocytosis.  Overall, patient is better today.  Will consult physical therapy.  Will replete low potassium.  We will continue to monitor renal function, electrolytes and blood counts.  10/05/2018: Patient seen alongside patient's wife.  Patient has continued to  improve.  No new complaints.  Patient is still n.p.o.  Speech team is appreciated.  For possible repeat speech evaluation in the morning.  Rehab team to reassess patient and see if the facility can manage patient.  Patient is still on tube feeds for now.  Further management will depend on hospital course.  10/06/2018.  Discussed and updated patient's mother.  No new complaints.  Awaiting rehabilitation input.  Patient is scheduled to have a repeat echocardiogram.  Otherwise, no fever or chills.  No chest pain.  Further management will depend on Hospital course  SIGNIFICANT EVENTS:  09/14/18 Admitted with complete heart block requiring temporary pacer insertion and intubation. 10/16 Extubated. Started on precedex for agitation 10/17 Reintubated for permanent pacemaker insertion.  Kept on vent due to agitation. 10/19 Ongoing agitation, head CT and EEG are unremarkable. Started on broad abx for persistent fevers, trach seceretions 09/19/18 - bronch BAL 0 95% polys 10/24  - sevee agitation, neuro called. Restarted home cogentin, buspar, celexa and increase risperdal to high home dose, failed pecedex, back on diprivan 10/25 - - afebrile since 09/22/18 . Creat slightly up; vanc level 37 and vanc on hold. Needing both precedex and diprivan for agitation control. Home psych meds restarted  Yesterday. This AM followed command. Wife reports improved following commnads. But failed SBT due to tachypnea. Being diuresed - 29m Lasix tid. Improved balance +7L was +10L  10/26 - fluid balance down to +6.5L with diuresis. Aafebrfile. CReat rising again with lasix. WBC rising to 20K.  Failed sBT within few minutes.   09/26/18 - lasix reduced and now on hold following hypotension. +6.3L . Creat better after lasix hold and fluid bolus ,   Had diarrhea  but on laxatives  09/28/2018 extubated   STUDIES:    2D echo- 09/14/2018 Focal basal hypertrophy, LVEF 60-65%, paradoxical septal motion consistent with right  ventricular pacing. CT head 09/16/2018- mild ventricular megaly, no acute intracranial findings, right frontal sinusitis. EEG 10/90/19-mild diffuse slowing, no seizures. CT head 10/24 > nil acute, mastoid effusion+, age advanced cerebral atrophy +  CULTURES:  HIV 10/15 - neg MRSA PCR 10/15 - positive Blood culture 10/17 -ng Blood cultures 10/19 -ng Trach aspirate 10/19 - nml flora  Resp BAL 10/20 > 95% polys. NORMal flora Urine culture 10/19-ng resp 10/27 >> nml flora  Assessment & Plan:   Principal Problem:   Delirium due to another medical condition Active Problems:   CHB (complete heart block) (HCC)   Complete heart block (HCC)   Encephalopathy acute   Pressure injury of skin   #Acute respiratory failure in the setting of complete heart block complicated by hypoxia and acidosis.  HCAP / aspiration pneumonia: - Patient is currently on oxygen  1L/Min via nasal cannula -Oxygen saturation is 95% -Continue Pulmonary toiletry -Mobilize patient -Physical therapy consult. -Rehab input is appreciated.  Severe schizoaffective disorder - multiple drugs Severe agitation, Acute encephalopathy -   Doubt serotonin syndrome, NMS.  History of anxiety, depression on psych meds x 30 yrs.  -Off of Precedex. -On Risperidone, Citalopram, Wellbutrin, buspirone - On PRN Haldol and PRN Xanax. - Minimize Mind altering medications -Stable for now.  #Complete heart block: - PPM placed 10/18. -Paced as needed  #AKI - resolved 09/18/18   -Resolved. -Resume Diuretics (Torsemide 15m po once daily) - Monitor renal function and electrolytes closely.   Hypernatremia: Resolved  Dysphagia - High aspiration risk - Speech therapy input - Tube feeds via Cortrak. 10/04/2018: Speech input is appreciated.  Patient is to remain n.p.o.  Continue to fix for now.  #Sepsis Syndrome - Pneumonia +/- Sinus -Continue cefepime for 14 days total - Continue to monitor Leukocytosis.  DVT  prophylaxis: Subcu heparin. Code Status: Full code Family Communication: Wife and son Disposition Plan: Likely rehab   Consultants:   Patient has been seen by cardiology and pulmonary team  Procedures:   Pacemaker placement  Status post intubation and extubation  Antimicrobials:  Cefazolin 10/17- 10/18 Vanco 10/19 >10/26 Zosyn 10/19 > 10/27 Cefepime 10/27 >>   Subjective: No fever chills.   No shortness of breath or chest pain.   Patient looks better today.    Objective: Vitals:   10/05/18 2001 10/06/18 0441 10/06/18 0809 10/06/18 1100  BP: 117/79 125/70 130/76 113/71  Pulse: 87 88 95 (!) 103  Resp:   20   Temp:  97.6 F (36.4 C) 98 F (36.7 C)   TempSrc:  Oral Oral   SpO2: 94% 91% 94% 93%  Weight:  88.2 kg    Height:        Intake/Output Summary (Last 24 hours) at 10/06/2018 1346 Last data filed at 10/05/2018 1808 Gross per 24 hour  Intake 0 ml  Output -  Net 0 ml   Filed Weights   10/04/18 0500 10/05/18 0410 10/06/18 0441  Weight: 87.8 kg 88 kg 88.2 kg    Examination:  General exam: Not in any distress.  Patient is awake and alert.      Respiratory system: Mild crackles left lung field posteriorly.   Cardiovascular system: S1 & S2 heard.  Mild leg edema.   Gastrointestinal system: Abdomen is obese, soft and nontender.  Organs are difficult to assess.   Central  nervous system: Awake and alert.  Agitated.  Anxious.  Moves all limbs.   Extremities: Leg edema is improving.     Psychiatry: Anxious and agitated  Data Reviewed: I have personally reviewed following labs and imaging studies  CBC: Recent Labs  Lab 10/02/18 1054 10/03/18 0318 10/04/18 0545 10/05/18 0423 10/06/18 0927  WBC 16.4* 15.9* 14.7* 14.3* 11.6*  NEUTROABS 13.8* 12.5* 11.1* 10.5* 8.5*  HGB 12.1* 10.4* 11.6* 11.6* 11.8*  HCT 38.8* 32.3* 36.6* 36.3* 37.4*  MCV 90.0 90.2 90.8 91.4 92.1  PLT 365 428* 365 348 287   Basic Metabolic Panel: Recent Labs  Lab 09/30/18 0239  10/01/18 0253 10/02/18 1054 10/03/18 0318 10/04/18 0545 10/05/18 0423 10/06/18 0927  NA 146* 144 141 141 140 141 140  K 3.1* 3.7 3.3* 3.2* 3.7 3.8 3.7  CL 117* 116* 111 109 108 106 102  CO2 21* _0 GLUCOSE 107* 100* 126* 115* 124* 112* 130*  BUN 26* _1 24* 30* 34*  CREATININE 0.95 0.90 0.84 0.88 0.82 0.90 0.89  CALCIUM 7.9* 8.1* 8.4* 8.2* 8.7* 8.8* 8.8*  MG 2.3 2.2 2.1  --   --  2.2  --   PHOS 3.3 3.4 2.9 3.3 3.6 3.8 3.7   GFR: Estimated Creatinine Clearance: 95.7 mL/min (by C-G formula based on SCr of 0.89 mg/dL). Liver Function Tests: Recent Labs  Lab 10/02/18 1054 10/03/18 0318 10/04/18 0545 10/05/18 0423 10/06/18 0927  ALBUMIN 2.5* 2.3* 2.5* 2.6* 2.7*   No results for input(s): LIPASE, AMYLASE in the last 168 hours. No results for input(s): AMMONIA in the last 168 hours. Coagulation Profile: No results for input(s): INR, PROTIME in the last 168 hours. Cardiac Enzymes: No results for input(s): CKTOTAL, CKMB, CKMBINDEX, TROPONINI in the last 168 hours. BNP (last 3 results) No results for input(s): PROBNP in the last 8760 hours. HbA1C: No results for input(s): HGBA1C in the last 72 hours. CBG: Recent Labs  Lab 10/05/18 1636 10/05/18 2006 10/06/18 0000 10/06/18 0438 10/06/18 0803  GLUCAP 112* 111* 108* 122* 125*   Lipid Profile: No results for input(s): CHOL, HDL, LDLCALC, TRIG, CHOLHDL, LDLDIRECT in the last 72 hours. Thyroid Function Tests: No results for input(s): TSH, T4TOTAL, FREET4, T3FREE, THYROIDAB in the last 72 hours. Anemia Panel: No results for input(s): VITAMINB12, FOLATE, FERRITIN, TIBC, IRON, RETICCTPCT in the last 72 hours. Urine analysis:    Component Value Date/Time   COLORURINE YELLOW 09/18/2018 0813   APPEARANCEUR CLEAR 09/18/2018 0813   LABSPEC 1.023 09/18/2018 0813   PHURINE 5.0 09/18/2018 0813   GLUCOSEU NEGATIVE 09/18/2018 0813   HGBUR LARGE (A) 09/18/2018 0813   BILIRUBINUR NEGATIVE 09/18/2018 0813    KETONESUR 5 (A) 09/18/2018 0813   PROTEINUR 100 (A) 09/18/2018 0813   NITRITE NEGATIVE 09/18/2018 0813   LEUKOCYTESUR NEGATIVE 09/18/2018 0813   Sepsis Labs: _2 (procalcitonin:4,lacticidven:4)  ) No results found for this or any previous visit (from the past 240 hour(s)).       Radiology Studies: Dg Swallowing Func-speech Pathology  Result Date: 10/06/2018 Objective Swallowing Evaluation: Type of Study: MBS-Modified Barium Swallow Study  Patient Details Name: Billy Simon MRN: [id number] Date of Birth: 12-25-1955 Today's Date: 10/06/2018 Time: SLP Start Time (ACUTE ONLY): 0830 -SLP Stop Time (ACUTE ONLY): 0854 SLP Time Calculation (min) (ACUTE ONLY): 24 min Past Medical History: Past Medical History: Diagnosis Date . CHB (complete heart block) ([place]) 09/14/2018 . Syncope 08/2018  Hospitalized at [facility] Past Surgical History: Past Surgical History:  Procedure Laterality Date . LEFT HEART CATH AND CORONARY ANGIOGRAPHY N/A 09/14/2018  Procedure: LEFT HEART CATH AND CORONARY ANGIOGRAPHY;  Surgeon: Martinique, Peter M, MD;  Location: [place] [facility];  Service: Cardiovascular;  Laterality: N/A; . PACEMAKER IMPLANT N/A 09/16/2018  Procedure: PACEMAKER IMPLANT;  Surgeon: Constance Haw, MD;  Location: [place] [facility];  Service: Cardiovascular;  Laterality: N/A; . TEMPORARY PACEMAKER N/A 09/14/2018  Procedure: TEMPORARY PACEMAKER;  Surgeon: Martinique, Peter M, MD;  Location: [place] [facility];  Service: Cardiovascular;  Laterality: N/A; HPI: 62 year old with a history of syncope who presented with complete heart block requiring intubation 10/15-10/16, reintubated 10/17-10/29 for pacemaker insertion. Pt hypoxic hypercarbic and acidotic in cath labl for right femoral temporary pacing wire placement. Exhibited severe schizoaffective disorder, severe agitation, acute encephalopathy. CT head 10/24 > nil acute, mastoid effusion+, age advanced cerebral atrophy +. Previous MBS 09/29/18 (1 day  pos-extubation) reccomended continue NPO.  No data recorded Assessment / Plan / Recommendation CHL IP CLINICAL IMPRESSIONS 10/06/2018 Clinical Impression Although moderate oropharyngeal dysphagia still evident, pt exhibits improved pharygneal strength and overall swallow function in comparison to last objective test (09/29/18). Oral deficits include lingual pumping and mild lingual residue with honey thick liquids and puree, however primary impairments remain pharyngeal in nature. Significantly reduced epiglottic invsersion lead to penetration of honey thick liquids to the level of the vocal folds in 4 out of 6 trials of bolus delivered from a cup. Chin tuck compensatory maneuver attempted, however ineffective to provide additional airway protection. Penetration into the laryngeal vestibule (remained above vocal folds) was observed in only 1 out of 3 trials of honey thick when delivered via teaspoon. Smaller bolus volume believed to greatly benefit pt's airway protection during swallow and also resulted in reduced (mild as opposed to moderate) vallecular and pyriform residue in comparison to larger boluses consumed from a cup. Pt's penetration of all POs remained silent throughout the study, however cued attempts to clear throat followed by a hard cough were effective to clear most penetrates consistently. Pt will be able to achieve increased epiglottic deflection once Cortrak is removed. SLP likely to initiate diet/liquids in the next 24 hours, however pt and wife require further education/training prior to initiating po's on a consistent basis. Pt must strictly adhere to precautions with puree and honey thick liquids to include small amounts via tsp, volitional cough after each swallow, multiple dry swallows. Continue NPO using Cortrak and SLP intervention with aformentiond recommendations/strategies with good prognosis to begin a diet once education and training complete. SLP Visit Diagnosis Dysphagia,  oropharyngeal phase (R13.12) Attention and concentration deficit following -- Frontal lobe and executive function deficit following -- Impact on safety and function Moderate aspiration risk;Severe aspiration risk   CHL IP TREATMENT RECOMMENDATION 10/06/2018 Treatment Recommendations Therapy as outlined in treatment plan below   Prognosis 10/06/2018 Prognosis for Safe Diet Advancement Good Barriers to Reach Goals -- Barriers/Prognosis Comment -- CHL IP DIET RECOMMENDATION 10/06/2018 SLP Diet Recommendations NPO;Alternative means - temporary;Other (Comment) Liquid Administration via -- Medication Administration Via alternative means Compensations -- Postural Changes --   CHL IP OTHER RECOMMENDATIONS 10/06/2018 Recommended Consults -- Oral Care Recommendations Oral care QID Other Recommendations --   CHL IP FOLLOW UP RECOMMENDATIONS 10/06/2018 Follow up Recommendations Skilled Nursing facility   [facility] IP FREQUENCY AND DURATION 10/06/2018 Speech Therapy Frequency (ACUTE ONLY) min 2x/week Treatment Duration 2 weeks      CHL IP ORAL PHASE 10/06/2018 Oral Phase Impaired Oral - Pudding Teaspoon --  Oral - Pudding Cup -- Oral - Honey Teaspoon Lingual/palatal residue;Lingual pumping Oral - Honey Cup Lingual pumping;Lingual/palatal residue Oral - Nectar Teaspoon -- Oral - Nectar Cup NT Oral - Nectar Straw -- Oral - Thin Teaspoon -- Oral - Thin Cup -- Oral - Thin Straw -- Oral - Puree Lingual pumping Oral - Mech Soft -- Oral - Regular WFL Oral - Multi-Consistency -- Oral - Pill -- Oral Phase - Comment --  CHL IP PHARYNGEAL PHASE 10/06/2018 Pharyngeal Phase Impaired Pharyngeal- Pudding Teaspoon -- Pharyngeal -- Pharyngeal- Pudding Cup -- Pharyngeal -- Pharyngeal- Honey Teaspoon Pharyngeal residue - pyriform;Pharyngeal residue - valleculae;Reduced epiglottic inversion;Penetration/Aspiration during swallow Pharyngeal Material enters airway, remains ABOVE vocal cords and not ejected out Pharyngeal- Honey Cup Pharyngeal residue -  valleculae;Pharyngeal residue - pyriform;Reduced epiglottic inversion;Compensatory strategies attempted (with notebox);Penetration/Aspiration during swallow;Delayed swallow initiation-vallecula Pharyngeal Material enters airway, CONTACTS cords and not ejected out Pharyngeal- Nectar Teaspoon -- Pharyngeal -- Pharyngeal- Nectar Cup NT Pharyngeal -- Pharyngeal- Nectar Straw -- Pharyngeal -- Pharyngeal- Thin Teaspoon -- Pharyngeal -- Pharyngeal- Thin Cup -- Pharyngeal -- Pharyngeal- Thin Straw -- Pharyngeal -- Pharyngeal- Puree Reduced epiglottic inversion;Delayed swallow initiation-vallecula;Pharyngeal residue - valleculae;Pharyngeal residue - pyriform Pharyngeal (No Data) Pharyngeal- Mechanical Soft -- Pharyngeal -- Pharyngeal- Regular Reduced epiglottic inversion;Penetration/Aspiration during swallow;Inter-arytenoid space residue;Pharyngeal residue - pyriform Pharyngeal Material enters airway, remains ABOVE vocal cords then ejected out;Material enters airway, passes BELOW cords then ejected out Pharyngeal- Multi-consistency -- Pharyngeal -- Pharyngeal- Pill -- Pharyngeal -- Pharyngeal Comment --  CHL IP CERVICAL ESOPHAGEAL PHASE 10/06/2018 Cervical Esophageal Phase WFL Pudding Teaspoon -- Pudding Cup -- Honey Teaspoon -- Honey Cup -- Nectar Teaspoon -- Nectar Cup -- Nectar Straw -- Thin Teaspoon -- Thin Cup -- Thin Straw -- Puree -- Mechanical Soft -- Regular -- Multi-consistency -- Pill -- Cervical Esophageal Comment -- Houston Siren 10/06/2018, 12:06 PM  Orbie Pyo Colvin Caroli.Ed Actor Pager [phone] Office [phone]                  Scheduled Meds: . benztropine  1 mg Oral Daily  . buPROPion  300 mg Oral Daily  . busPIRone  15 mg Oral Daily  . chlorhexidine gluconate (MEDLINE KIT)  15 mL Mouth Rinse BID  . citalopram  40 mg Oral Daily  . free water  30 mL Per Tube Q4H  . heparin  5,000 Units Subcutaneous Q8H  . pantoprazole sodium  40 mg Per Tube Q1200  .  polyethylene glycol  17 g Oral Daily  . risperiDONE  2 mg Per Tube TID  . torsemide  20 mg Oral Daily   Continuous Infusions: . sodium chloride    . sodium chloride Stopped (10/03/18 0300)  . feeding supplement (VITAL AF 1.2 CAL) 1,500 mL (10/06/18 0604)     LOS: 22 days   Time spent: 25 minutes.  Dana Allan, MD  [facility] Pager #: [phone] 7PM-7AM contact night coverage as above

## 2018-10-06 NOTE — Progress Notes (Signed)
Nutrition Follow-up  DOCUMENTATION CODES:   Not applicable  INTERVENTION:    Continue TF via Cortrak tube: Vital AF 1.2 at 80 ml/h  Monitor for diet advancement after MBS  NUTRITION DIAGNOSIS:   Inadequate oral intake related to dysphagia as evidenced by NPO status.  Ongoing  GOAL:   Patient will meet greater than or equal to 90% of their needs  Met with TF  MONITOR:   TF tolerance, Diet advancement, PO intake, Labs, Skin, Weight trends, I & O's  ASSESSMENT:   62 yo Male with h/o complete heart block required a cath and temporary femoral pacer insertion on 09/14/2018.  Currently receiving Vital AF 1.2 via Cortrak tube (tip is post-pyloric) at 80 ml/h to provide 2304 kcals, 144 gm protein, 1557 ml free water daily.  Free water flushes 30 ml every 4 hours. Patient remains NPO today. For MBS with SLP tomorrow.  Labs reviewed. CBG's: [phone] Medications reviewed.  I/O +9.8 L since admission Weight down 9.3 kg since admission  Diet Order:   Diet Order            Diet NPO time specified  Diet effective now              EDUCATION NEEDS:   Not appropriate for education at this time  Skin:  Skin Assessment: Skin Integrity Issues: Skin Integrity Issues:: Incisions, Other (Comment) Incisions: closed chest incision Other: Non pressure wound to R leg; 2 skin tears to back; MASD to sacrum & buttocks   Last BM:  11/4 (type 6)  Height:   Ht Readings from Last 1 Encounters:  09/19/18 6' (1.829 m)    Weight:   Wt Readings from Last 1 Encounters:  10/06/18 88.2 kg    Ideal Body Weight:  81 kg  BMI:  Body mass index is 26.37 kg/m.  Estimated Nutritional Needs:   Kcal:  2200-2400  Protein:  115-130 gm  Fluid:  2.2-2.4 L    Molli Barrows, RD, LDN, CNSC Pager [phone] After Hours Pager [phone]

## 2018-10-06 NOTE — Clinical Social Work Note (Signed)
Clinical Social Work Assessment  Patient Details  Name: Billy Simon MRN: [id number] Date of Birth: Dec 16, 1955  Date of referral:  10/06/18               Reason for consult:  Facility Placement, Discharge Planning                Permission sought to share information with:  Facility Sport and exercise psychologist, Family Supports Permission granted to share information::  Yes, Verbal Permission Granted  Name::     Alger::  SNFs  Relationship::  spouse  Contact Information:  [phone]  Housing/Transportation Living arrangements for the past 2 months:  Single Family Home Source of Information:  Spouse Patient Interpreter Needed:  None Criminal Activity/Legal Involvement Pertinent to Current Situation/Hospitalization:  No - Comment as needed Significant Relationships:  Spouse Lives with:  Spouse Do you feel safe going back to the place where you live?  Yes Need for family participation in patient care:  Yes (Comment)  Care giving concerns: Patient from home with spouse. PT recommending CIR. CSW consulting for SNF backup. Patient's insurance will not cover CIR, and unlikely to cover SNF. Patient's wife considering SNF due to lower cost, as compared to CIR. Patient currently on NG tube, speech following to advance diet.   Social Worker assessment / plan: CSW met with patient and wife, Billy Simon, at bedside. Patient alert, but distracted by NG tube. Wife had questions about how and when patient's diet will be advanced, and also concerned about the cost for CIR. CSW gave information on the out of pocket cost for room and board at SNF, indicating that PT/OT/speech, medications, would be billed separately.   Wife wanted referrals sent to SNFs. CSW sent initial SNF referrals; will have to confirm with SNFs if any portion of rehab will be covered by patient's insurance or if patient would need to be completely private pay.  CSW awaiting bed offers, and will follow up with SNFs for  better estimate of cost. Updated RNCM and Kelly with CIR. CSW to follow and support with disposition planning.  Employment status:    Insurance information:  Other Scientist, water quality) PT Recommendations:  Inpatient Rehab Consult Information / Referral to community resources:  [place]  Patient/Family's Response to care: Wife appreciative of care.  Patient/Family's Understanding of and Emotional Response to Diagnosis, Current Treatment, and Prognosis: Wife with understanding of patient's condition and considering CIR vs SNF for disposition planning.  Emotional Assessment Appearance:  Appears stated age Attitude/Demeanor/Rapport:  (alert but distracted ) Affect (typically observed):  Unable to Assess Orientation:  Oriented to Self, Oriented to Place Alcohol / Substance use:  Not Applicable Psych involvement (Current and /or in the community):  No (Comment)  Discharge Needs  Concerns to be addressed:  Care Coordination, Discharge Planning Concerns Readmission within the last 30 days:  No Current discharge risk:  Physical Impairment Barriers to Discharge:  Continued Medical Work up, Inadequate or no insurance   [name], LCSW 10/06/2018, 3:29 PM

## 2018-10-07 LAB — GLUCOSE, CAPILLARY
GLUCOSE-CAPILLARY: 122 mg/dL — AB (ref 70–99)
GLUCOSE-CAPILLARY: 111 mg/dL — AB (ref 70–99)
GLUCOSE-CAPILLARY: 103 mg/dL — AB (ref 70–99)
Glucose-Capillary: 117 mg/dL — ABNORMAL HIGH (ref 70–99)
Glucose-Capillary: 118 mg/dL — ABNORMAL HIGH (ref 70–99)
Glucose-Capillary: 121 mg/dL — ABNORMAL HIGH (ref 70–99)

## 2018-10-07 MED ORDER — RESOURCE THICKENUP CLEAR PO POWD
ORAL | Status: DC | PRN
  Administered 2018-10-07: 13:00:00 via ORAL
  Filled 2018-10-07 (×2): qty 125

## 2018-10-07 MED ORDER — RISPERIDONE 1 MG/ML PO SOLN
2.0000 mg | Freq: Once | ORAL | Status: DC
  Filled 2018-10-07: qty 2

## 2018-10-07 MED ORDER — CHLORHEXIDINE GLUCONATE 0.12 % MT SOLN
15.0000 mL | Freq: Two times a day (BID) | OROMUCOSAL | Status: DC
  Administered 2018-10-08 – 2018-10-11 (×7): 15 mL via OROMUCOSAL
  Filled 2018-10-07 (×6): qty 15

## 2018-10-07 MED ORDER — ALUM & MAG HYDROXIDE-SIMETH 200-200-20 MG/5ML PO SUSP
30.0000 mL | Freq: Once | ORAL | Status: AC
  Administered 2018-10-07: 30 mL via ORAL
  Filled 2018-10-07: qty 30

## 2018-10-07 MED ORDER — RISPERIDONE 1 MG/ML PO SOLN
2.0000 mg | Freq: Once | ORAL | Status: AC
  Administered 2018-10-07: 2 mg via ORAL
  Filled 2018-10-07: qty 2

## 2018-10-07 NOTE — Care Management Note (Signed)
Case Management Note  Patient Details  Name: Billy Simon MRN: [id number] Date of Birth: 02/18/1956  Subjective/Objective:                    Action/Plan:   Expected Discharge Date:                  Expected Discharge Plan:  IP Rehab Facility  In-House Referral:  NA  Discharge planning Services  CM Consult  Post Acute Care Choice:  NA Choice offered to:  NA  DME Arranged:  N/A DME Agency:  NA  HH Arranged:  NA HH Agency:  NA  Status of Service:  Completed, signed off  If discussed at Long Length of Stay Meetings, dates discussed:    Additional Comments: 1216 10-07-18 Tomi Bamberger, RN,BSN [phone] CM did speak with CSW-plan for patient will be to transition to ALF [facility] once stable. CSW did make Inpatient Rehab Coordinator aware of plan of care. No further needs from CM at this time.  Gala Lewandowsky, RN 10/07/2018, 12:16 PM

## 2018-10-07 NOTE — Progress Notes (Signed)
CSW met with patient, spouse, and patient's sister [name]. Patient's sister reported that their family owns several ALFs and patient will have a bed at [facility] in [place]. The ALF will still require therapy orders and discharge summary when patient medically stable and discharged. Ms. [name] provided contact information for admissions at the ALF. CSW to follow and support with discharge planning.  [name], [place]

## 2018-10-07 NOTE — Progress Notes (Signed)
Late Entry: Patient ate well at lunch , per wife approximately 65%. Patient only ate magic cup off of dinner tray.  Patient took several bites of applesauce with medications throughout the day.

## 2018-10-07 NOTE — Progress Notes (Signed)
Inpatient Rehabilitation-Admissions Coordinator   Noted family decision for ALF. AC will sign off at this time.   Please call if questions.   Nanine Means, OTR/L  Rehab Admissions Coordinator  [phone] 10/07/2018 12:44 PM

## 2018-10-07 NOTE — Progress Notes (Signed)
PROGRESS NOTE    Billy Simon  ZOX:[id number] DOB: October 27, 1956 DOA: 09/14/2018 PCP: Patient, No Pcp Per  Outpatient Specialists:   Brief Narrative:  Patient is a 62 year old Caucasian male, with past medical history significant for depression, anxiety, syncope, was recently found to be in complete heart block requiring cardiac cath (normal coronaries) and temporary femoral pacer insertion on 09/14/2018.  As per prior documentations, patient was intubated for respiratory failure with hypoxia, hypercarbia, bloody frothy sputum / HCAP extensive infx BL.  Briefly on pressors for hypotension. Patient was extubated 10/29.   Hospital course has also been complicated by acute kidney injury, electrolyte abnormality, severe agitation necessitating use of Precedex and benzodiazepine, and acute encephalopathy that is resolving slowly.  Echo revealed normal EF, but the diastolic function could not be assessed.  Cardiac catheterization revealed mildly elevated LVEDP.  Patient's oxygen requirement is down to 1 L/min via nasal cannula, and the O2 sat is 95%.  Patient still looks weak.  Patient seems to get easily agitated.  10/07/2018.  Discussed and updated patient's wife.  No new complaints.  Awaiting rehabilitation input.   Otherwise, no fever or chills.  No chest pain.  Further management will depend on Hospital course  SIGNIFICANT EVENTS:  09/14/18 Admitted with complete heart block requiring temporary pacer insertion and intubation. 10/16 Extubated. Started on precedex for agitation 10/17 Reintubated for permanent pacemaker insertion.  Kept on vent due to agitation. 10/19 Ongoing agitation, head CT and EEG are unremarkable. Started on broad abx for persistent fevers, trach seceretions 09/19/18 - bronch BAL 0 95% polys 10/24  - sevee agitation, neuro called. Restarted home cogentin, buspar, celexa and increase risperdal to high home dose, failed pecedex, back on diprivan 10/25 - - afebrile since 09/22/18 .  Creat slightly up; vanc level 37 and vanc on hold. Needing Simon precedex and diprivan for agitation control. Home psych meds restarted  Yesterday. This AM followed command. Wife reports improved following commnads. But failed SBT due to tachypnea. Being diuresed - 35m Lasix tid. Improved balance +7L was +10L  10/26 - fluid balance down to +6.5L with diuresis. Aafebrfile. CReat rising again with lasix. WBC rising to 20K.  Failed sBT within few minutes.   09/26/18 - lasix reduced and now on hold following hypotension. +6.3L . Creat better after lasix hold and fluid bolus ,   Had diarrhea but on laxatives  09/28/2018 extubated   STUDIES:    2D echo- 09/14/2018 Focal basal hypertrophy, LVEF 60-65%, paradoxical septal motion consistent with right ventricular pacing. CT head 09/16/2018- mild ventricular megaly, no acute intracranial findings, right frontal sinusitis. EEG 10/90/19-mild diffuse slowing, no seizures. CT head 10/24 > nil acute, mastoid effusion+, age advanced cerebral atrophy +  CULTURES:  HIV 10/15 - neg MRSA PCR 10/15 - positive Blood culture 10/17 -ng Blood cultures 10/19 -ng Trach aspirate 10/19 - nml flora  Resp BAL 10/20 > 95% polys. NORMal flora Urine culture 10/19-ng resp 10/27 >> nml flora  Assessment & Plan:   Principal Problem:   Delirium due to another medical condition Active Problems:   CHB (complete heart block) (HCC)   Complete heart block (HCC)   Encephalopathy acute   Pressure injury of skin   Debility   Schizoaffective disorder, depressive type (HCC)   Dysphagia   Leukocytosis   #Acute respiratory failure in the setting of complete heart block complicated by hypoxia and acidosis.  HCAP / aspiration pneumonia: - Patient is currently on oxygen  1L/Min via nasal cannula -Oxygen saturation is  95% -Continue Pulmonary toiletry -Mobilize patient -Physical therapy consult. -Rehab input is appreciated.  Severe schizoaffective disorder -  multiple drugs Severe agitation, Acute encephalopathy -   Doubt serotonin syndrome, NMS.  History of anxiety, depression on psych meds x 30 yrs.  -Off of Precedex. -On Risperidone, Citalopram, Wellbutrin, buspirone - On PRN Haldol and PRN Xanax. - Minimize Mind altering medications -Stable for now.  #Complete heart block: - PPM placed 10/18. -Paced as needed  #AKI - resolved 09/18/18   -Resolved. -Resume Diuretics (Torsemide 31m po once daily) - Monitor renal function and electrolytes closely.   Hypernatremia: Resolved  Dysphagia - High aspiration risk - Speech therapy input - Tube feeds via Cortrak. 10/04/2018: Speech input is appreciated.  Patient is to remain n.p.o.  Continue to fix for now.  #Sepsis Syndrome - Pneumonia +/- Sinus -Continue cefepime for 14 days total - Continue to monitor Leukocytosis.  DVT prophylaxis: Subcu heparin. Code Status: Full code Family Communication: Wife and son Disposition Plan: Likely rehab   Consultants:   Patient has been seen by cardiology and pulmonary team  Procedures:   Pacemaker placement  Status post intubation and extubation  Antimicrobials:  Cefazolin 10/17- 10/18 Vanco 10/19 >10/26 Zosyn 10/19 > 10/27 Cefepime 10/27 >>   Subjective: No fever chills.   No shortness of breath or chest pain.   Patient looks better today.    Objective: Vitals:   10/07/18 0021 10/07/18 0300 10/07/18 0900 10/07/18 1100  BP: (!) 110/96 117/67 (!) 145/73 127/73  Pulse:  93 89 84  Resp: (!) 29     Temp: 98 F (36.7 C) 97.8 F (36.6 C)  98.5 F (36.9 C)  TempSrc: Oral Oral  Oral  SpO2:  96%    Weight:  88.4 kg    Height:        Intake/Output Summary (Last 24 hours) at 10/07/2018 1534 Last data filed at 10/07/2018 1200 Gross per 24 hour  Intake 90 ml  Output 425 ml  Net -335 ml   Filed Weights   10/05/18 0410 10/06/18 0441 10/07/18 0300  Weight: 88 kg 88.2 kg 88.4 kg    Examination:  General exam: Not in  any distress.  Patient is awake and alert.      Respiratory system: Mild crackles left lung field posteriorly.   Cardiovascular system: S1 & S2 heard.  Mild leg edema.   Gastrointestinal system: Abdomen is obese, soft and nontender.  Organs are difficult to assess.   Central nervous system: Awake and alert.  Agitated.  Anxious.  Moves all limbs.   Extremities: Leg edema is improving.     Psychiatry: Anxious and agitated  Data Reviewed: I have personally reviewed following labs and imaging studies  CBC: Recent Labs  Lab 10/02/18 1054 10/03/18 0318 10/04/18 0545 10/05/18 0423 10/06/18 0927  WBC 16.4* 15.9* 14.7* 14.3* 11.6*  NEUTROABS 13.8* 12.5* 11.1* 10.5* 8.5*  HGB 12.1* 10.4* 11.6* 11.6* 11.8*  HCT 38.8* 32.3* 36.6* 36.3* 37.4*  MCV 90.0 90.2 90.8 91.4 92.1  PLT 365 428* 365 348 2277  Basic Metabolic Panel: Recent Labs  Lab 10/01/18 0253 10/02/18 1054 10/03/18 0318 10/04/18 0545 10/05/18 0423 10/06/18 0927  NA 144 141 141 140 141 140  K 3.7 3.3* 3.2* 3.7 3.8 3.7  CL 116* 111 109 108 106 102  CO2 _0 GLUCOSE 100* 126* 115* 124* 112* 130*  BUN _1 24* 30* 34*  CREATININE 0.90 0.84 0.88 0.82 0.90  0.89  CALCIUM 8.1* 8.4* 8.2* 8.7* 8.8* 8.8*  MG 2.2 2.1  --   --  2.2  --   PHOS 3.4 2.9 3.3 3.6 3.8 3.7   GFR: Estimated Creatinine Clearance: 95.7 mL/min (by C-G formula based on SCr of 0.89 mg/dL). Liver Function Tests: Recent Labs  Lab 10/02/18 1054 10/03/18 0318 10/04/18 0545 10/05/18 0423 10/06/18 0927  ALBUMIN 2.5* 2.3* 2.5* 2.6* 2.7*   No results for input(s): LIPASE, AMYLASE in the last 168 hours. No results for input(s): AMMONIA in the last 168 hours. Coagulation Profile: No results for input(s): INR, PROTIME in the last 168 hours. Cardiac Enzymes: No results for input(s): CKTOTAL, CKMB, CKMBINDEX, TROPONINI in the last 168 hours. BNP (last 3 results) No results for input(s): PROBNP in the last 8760 hours. HbA1C: No results for  input(s): HGBA1C in the last 72 hours. CBG: Recent Labs  Lab 10/06/18 2058 10/07/18 0019 10/07/18 0505 10/07/18 0826 10/07/18 1127  GLUCAP 119* 117* 121* 122* 111*   Lipid Profile: No results for input(s): CHOL, HDL, LDLCALC, TRIG, CHOLHDL, LDLDIRECT in the last 72 hours. Thyroid Function Tests: No results for input(s): TSH, T4TOTAL, FREET4, T3FREE, THYROIDAB in the last 72 hours. Anemia Panel: No results for input(s): VITAMINB12, FOLATE, FERRITIN, TIBC, IRON, RETICCTPCT in the last 72 hours. Urine analysis:    Component Value Date/Time   COLORURINE YELLOW 09/18/2018 0813   APPEARANCEUR CLEAR 09/18/2018 0813   LABSPEC 1.023 09/18/2018 0813   PHURINE 5.0 09/18/2018 0813   GLUCOSEU NEGATIVE 09/18/2018 0813   HGBUR LARGE (A) 09/18/2018 0813   BILIRUBINUR NEGATIVE 09/18/2018 0813   KETONESUR 5 (A) 09/18/2018 0813   PROTEINUR 100 (A) 09/18/2018 0813   NITRITE NEGATIVE 09/18/2018 0813   LEUKOCYTESUR NEGATIVE 09/18/2018 0813   Sepsis Labs: _0 (procalcitonin:4,lacticidven:4)  ) No results found for this or any previous visit (from the past 240 hour(s)).       Radiology Studies: Dg Swallowing Func-speech Pathology  Result Date: 10/06/2018 Objective Swallowing Evaluation: Type of Study: MBS-Modified Barium Swallow Study  Patient Details Name: Billy Simon MRN: [id number] Date of Birth: 09-21-1956 Today's Date: 10/06/2018 Time: SLP Start Time (ACUTE ONLY): 0830 -SLP Stop Time (ACUTE ONLY): 0854 SLP Time Calculation (min) (ACUTE ONLY): 24 min Past Medical History: Past Medical History: Diagnosis Date . CHB (complete heart block) ([place]) 09/14/2018 . Syncope 08/2018  Hospitalized at [facility] Past Surgical History: Past Surgical History: Procedure Laterality Date . LEFT HEART CATH AND CORONARY ANGIOGRAPHY N/A 09/14/2018  Procedure: LEFT HEART CATH AND CORONARY ANGIOGRAPHY;  Surgeon: Martinique, Peter M, MD;  Location: [facility];  Service: Cardiovascular;  Laterality:  N/A; . PACEMAKER IMPLANT N/A 09/16/2018  Procedure: PACEMAKER IMPLANT;  Surgeon: Constance Haw, MD;  Location: [facility];  Service: Cardiovascular;  Laterality: N/A; . TEMPORARY PACEMAKER N/A 09/14/2018  Procedure: TEMPORARY PACEMAKER;  Surgeon: Martinique, Peter M, MD;  Location: [place] [facility];  Service: Cardiovascular;  Laterality: N/A; HPI: 62 year old with a history of syncope who presented with complete heart block requiring intubation 10/15-10/16, reintubated 10/17-10/29 for pacemaker insertion. Pt hypoxic hypercarbic and acidotic in cath labl for right femoral temporary pacing wire placement. Exhibited severe schizoaffective disorder, severe agitation, acute encephalopathy. CT head 10/24 > nil acute, mastoid effusion+, age advanced cerebral atrophy +. Previous MBS 09/29/18 (1 day pos-extubation) reccomended continue NPO.  No data recorded Assessment / Plan / Recommendation CHL IP CLINICAL IMPRESSIONS 10/06/2018 Clinical Impression Although moderate oropharyngeal dysphagia still evident, pt exhibits improved pharygneal  strength and overall swallow function in comparison to last objective test (09/29/18). Oral deficits include lingual pumping and mild lingual residue with honey thick liquids and puree, however primary impairments remain pharyngeal in nature. Significantly reduced epiglottic invsersion lead to penetration of honey thick liquids to the level of the vocal folds in 4 out of 6 trials of bolus delivered from a cup. Chin tuck compensatory maneuver attempted, however ineffective to provide additional airway protection. Penetration into the laryngeal vestibule (remained above vocal folds) was observed in only 1 out of 3 trials of honey thick when delivered via teaspoon. Smaller bolus volume believed to greatly benefit pt's airway protection during swallow and also resulted in reduced (mild as opposed to moderate) vallecular and pyriform residue in comparison to larger boluses consumed  from a cup. Pt's penetration of all POs remained silent throughout the study, however cued attempts to clear throat followed by a hard cough were effective to clear most penetrates consistently. Pt will be able to achieve increased epiglottic deflection once Cortrak is removed. SLP likely to initiate diet/liquids in the next 24 hours, however pt and wife require further education/training prior to initiating po's on a consistent basis. Pt must strictly adhere to precautions with puree and honey thick liquids to include small amounts via tsp, volitional cough after each swallow, multiple dry swallows. Continue NPO using Cortrak and SLP intervention with aformentiond recommendations/strategies with good prognosis to begin a diet once education and training complete. SLP Visit Diagnosis Dysphagia, oropharyngeal phase (R13.12) Attention and concentration deficit following -- Frontal lobe and executive function deficit following -- Impact on safety and function Moderate aspiration risk;Severe aspiration risk   CHL IP TREATMENT RECOMMENDATION 10/06/2018 Treatment Recommendations Therapy as outlined in treatment plan below   Prognosis 10/06/2018 Prognosis for Safe Diet Advancement Good Barriers to Reach Goals -- Barriers/Prognosis Comment -- CHL IP DIET RECOMMENDATION 10/06/2018 SLP Diet Recommendations NPO;Alternative means - temporary;Other (Comment) Liquid Administration via -- Medication Administration Via alternative means Compensations -- Postural Changes --   CHL IP OTHER RECOMMENDATIONS 10/06/2018 Recommended Consults -- Oral Care Recommendations Oral care QID Other Recommendations --   CHL IP FOLLOW UP RECOMMENDATIONS 10/06/2018 Follow up Recommendations Skilled Nursing facility   [facility] IP FREQUENCY AND DURATION 10/06/2018 Speech Therapy Frequency (ACUTE ONLY) min 2x/week Treatment Duration 2 weeks      CHL IP ORAL PHASE 10/06/2018 Oral Phase Impaired Oral - Pudding Teaspoon -- Oral - Pudding Cup -- Oral - Honey Teaspoon  Lingual/palatal residue;Lingual pumping Oral - Honey Cup Lingual pumping;Lingual/palatal residue Oral - Nectar Teaspoon -- Oral - Nectar Cup NT Oral - Nectar Straw -- Oral - Thin Teaspoon -- Oral - Thin Cup -- Oral - Thin Straw -- Oral - Puree Lingual pumping Oral - Mech Soft -- Oral - Regular WFL Oral - Multi-Consistency -- Oral - Pill -- Oral Phase - Comment --  CHL IP PHARYNGEAL PHASE 10/06/2018 Pharyngeal Phase Impaired Pharyngeal- Pudding Teaspoon -- Pharyngeal -- Pharyngeal- Pudding Cup -- Pharyngeal -- Pharyngeal- Honey Teaspoon Pharyngeal residue - pyriform;Pharyngeal residue - valleculae;Reduced epiglottic inversion;Penetration/Aspiration during swallow Pharyngeal Material enters airway, remains ABOVE vocal cords and not ejected out Pharyngeal- Honey Cup Pharyngeal residue - valleculae;Pharyngeal residue - pyriform;Reduced epiglottic inversion;Compensatory strategies attempted (with notebox);Penetration/Aspiration during swallow;Delayed swallow initiation-vallecula Pharyngeal Material enters airway, CONTACTS cords and not ejected out Pharyngeal- Nectar Teaspoon -- Pharyngeal -- Pharyngeal- Nectar Cup NT Pharyngeal -- Pharyngeal- Nectar Straw -- Pharyngeal -- Pharyngeal- Thin Teaspoon -- Pharyngeal -- Pharyngeal- Thin Cup -- Pharyngeal -- Pharyngeal- Thin Straw --  Pharyngeal -- Pharyngeal- Puree Reduced epiglottic inversion;Delayed swallow initiation-vallecula;Pharyngeal residue - valleculae;Pharyngeal residue - pyriform Pharyngeal (No Data) Pharyngeal- Mechanical Soft -- Pharyngeal -- Pharyngeal- Regular Reduced epiglottic inversion;Penetration/Aspiration during swallow;Inter-arytenoid space residue;Pharyngeal residue - pyriform Pharyngeal Material enters airway, remains ABOVE vocal cords then ejected out;Material enters airway, passes BELOW cords then ejected out Pharyngeal- Multi-consistency -- Pharyngeal -- Pharyngeal- Pill -- Pharyngeal -- Pharyngeal Comment --  CHL IP CERVICAL ESOPHAGEAL PHASE 10/06/2018  Cervical Esophageal Phase WFL Pudding Teaspoon -- Pudding Cup -- Honey Teaspoon -- Honey Cup -- Nectar Teaspoon -- Nectar Cup -- Nectar Straw -- Thin Teaspoon -- Thin Cup -- Thin Straw -- Puree -- Mechanical Soft -- Regular -- Multi-consistency -- Pill -- Cervical Esophageal Comment -- [place] Siren 10/06/2018, 12:06 PM  Orbie Pyo Colvin Caroli.Ed Actor Pager [phone] Office [phone]                  Scheduled Meds: . benztropine  1 mg Oral Daily  . buPROPion  300 mg Oral Daily  . busPIRone  15 mg Oral Daily  . chlorhexidine gluconate (MEDLINE KIT)  15 mL Mouth Rinse BID  . citalopram  40 mg Oral Daily  . free water  30 mL Per Tube Q4H  . heparin  5,000 Units Subcutaneous Q8H  . pantoprazole sodium  40 mg Per Tube Q1200  . polyethylene glycol  17 g Oral Daily  . risperiDONE  2 mg Per Tube TID  . torsemide  20 mg Oral Daily   Continuous Infusions: . sodium chloride    . sodium chloride Stopped (10/03/18 0300)     LOS: 23 days   Time spent: 25 minutes.  Dana Allan, MD  [facility] Pager #: [phone] 7PM-7AM contact night coverage as above

## 2018-10-07 NOTE — Progress Notes (Signed)
Physical Therapy Treatment Patient Details Name: Billy Simon MRN: [id number] DOB: 1956-07-13 Today's Date: 10/07/2018    History of Present Illness Pt is a 62 y.o. male admitted 09/14/18 with complete heart block. S/p heart cath and temporary pacermaker placement 10/15. ETT 10/15-10/17, reintubated 10/17-10/24. S/p permanent pacer placed 10/17. Hospital course complicated by AKI, electrolyte abnormality, severe agitation and acute encephalopathy that is resolving slowly. PMH includes schizoaffective disorder, syncope.   PT Comments    Pt slowly progressing with mobility. Requires minA to maintain balance with short ambulation distance using RW; although requiring decreased assist for mobility overall, remains limited by decreased activity tolerance, fatigue and generalized weakness. Continues to demonstrate flat affect and poor safety awareness. Pt plans to discharge to ALF for continued rehab. Will follow acutely.   Follow Up Recommendations  SNF;Supervision/Assistance - 24 hour(family choosing SNF/ALF over CIR)     Equipment Recommendations  (TBD next venue)    Recommendations for Other Services       Precautions / Restrictions Precautions Precautions: Fall Precaution Comments: Cortrak; (pacemaker 09/16/2018) Restrictions Weight Bearing Restrictions: No    Mobility  Bed Mobility Overal bed mobility: Needs Assistance Bed Mobility: Supine to Sit;Sit to Supine     Supine to sit: Supervision Sit to supine: Supervision   General bed mobility comments: Increased time and effort  Transfers Overall transfer level: Needs assistance Equipment used: Rolling walker (2 wheeled)             General transfer comment: Performed 5x repeated sit<>stands, required ~5-10 sec rest between trials, heavy reliance on UE support to push into standing. Repeated cues for correct hand placement  Ambulation/Gait Ambulation/Gait assistance: Min guard;Min assist Gait Distance (Feet): 20  Feet Assistive device: Rolling walker (2 wheeled) Gait Pattern/deviations: Step-to pattern;Trunk flexed;Leaning posteriorly;Shuffle Gait velocity: Decreased Gait velocity interpretation: <1.31 ft/sec, indicative of household ambulator General Gait Details: Slow, shuffling steps with RW and close min guard for balance; posterior lean with turning requiring minA to correct LOB. DOE 3/4. Bilat knee instability   Stairs             Wheelchair Mobility    Modified Rankin (Stroke Patients Only)       Balance Overall balance assessment: Needs assistance Sitting-balance support: No upper extremity supported;Feet supported Sitting balance-Leahy Scale: Good Sitting balance - Comments: Donned shoes sitting EOB   Standing balance support: Bilateral upper extremity supported Standing balance-Leahy Scale: Poor Standing balance comment: Reliant on UE support                            Cognition Arousal/Alertness: Awake/alert Behavior During Therapy: Flat affect Overall Cognitive Status: Impaired/Different from baseline Area of Impairment: Attention;Following commands;Safety/judgement;Awareness;Problem solving                   Current Attention Level: Selective   Following Commands: Follows one step commands consistently;Follows one step commands with increased time Safety/Judgement: Decreased awareness of safety;Decreased awareness of deficits Awareness: Emergent Problem Solving: Slow processing;Requires verbal cues General Comments: Pt participates, but continues to verbalize words and interact very little; very flat. Answers questions appropriately. Continues to require repeated cues for safety      Exercises      General Comments General comments (skin integrity, edema, etc.): Wife present but stepped out during session. When asked, pt reports he likes having wife present during sessions      Pertinent Vitals/Pain Pain Assessment: No/denies pain     Home Living  Prior Function            PT Goals (current goals can now be found in the care plan section) Acute Rehab PT Goals Patient Stated Goal: To rest PT Goal Formulation: With patient Time For Goal Achievement: 10/13/18 Potential to Achieve Goals: Good Progress towards PT goals: Progressing toward goals    Frequency    Min 3X/week      PT Plan Discharge plan needs to be updated    Co-evaluation              AM-PAC PT "6 Clicks" Daily Activity  Outcome Measure  Difficulty turning over in bed (including adjusting bedclothes, sheets and blankets)?: A Little Difficulty moving from lying on back to sitting on the side of the bed? : A Little Difficulty sitting down on and standing up from a chair with arms (e.g., wheelchair, bedside commode, etc,.)?: Unable Help needed moving to and from a bed to chair (including a wheelchair)?: A Little Help needed walking in hospital room?: A Little Help needed climbing 3-5 steps with a railing? : A Lot 6 Click Score: 15    End of Session Equipment Utilized During Treatment: Gait belt Activity Tolerance: Patient tolerated treatment well Patient left: in bed;with call bell/phone within reach;with bed alarm set Nurse Communication: Mobility status PT Visit Diagnosis: Other abnormalities of gait and mobility (R26.89);Muscle weakness (generalized) (M62.81)     Time: 6578-4696 PT Time Calculation (min) (ACUTE ONLY): 21 min  Charges:  $Therapeutic Exercise: 8-22 mins                     Ina Homes, PT, DPT Acute Rehabilitation Services  Pager [phone] Office [phone]  Malachy Chamber 10/07/2018, 5:01 PM

## 2018-10-07 NOTE — Progress Notes (Addendum)
  Speech Language Pathology Treatment: Dysphagia  Patient Details Name: Billy Simon MRN: [id number] DOB: Apr 17, 1956 Today's Date: 10/07/2018 Time: 0828-0908 SLP Time Calculation (min) (ACUTE ONLY): 40 min  Assessment / Plan / Recommendation Clinical Impression  Pt was alert and engaged throughout dysphagia treatment session today. Following oral care, SLP provided pt and family extensive education and training regarding strict adherence to compensatory strategies for safe swallow; strategies inclue: liquids by teaspoon only, small bites and sips with a throat clear and strong cough after each swallow, intermittent dry swallows throught meals. Provided max verbal/visual/tactile cues, pt demonstrated use of recommended strategies with honey thick liquids and pudding texture PO. By the end of our session he also verbally recited strategies given only min verbal cues from the clinician. ST initiated/ordered dysphagia 1 (puree), honey thick liquids ONLY from teaspoon, full supervision to ensure small bites and sips, slow rate, throat clear followed by hard cough, and perform extra dry swallows intermittently throughout meals to assist in clearance of pharyngeal residue. Although pt demonstrates significant improvements and progress toward acute dysphagia goals, he requires FULL supervision and cueing for consistent implementation of safe swallow strategies. Given deconditioning, pt likely to still require Cortrak to supplement nutrition at present- dietary may wish to assess appropriate volume of NGT feeding now that pt will initiate PO diet.Would prefer pt remain in acute care until ability to consume adequate volume safely and wife/pt feel comfortable with strategies during intake (tomorrow at earliest). He may continue to have 1-5 ice chips AFTER oral care with full supervision as well. Pt would greatly benefit from intensive CIR-level therapies with continued ST; if CIR is not an option for this pt, ST  recommends SNF as next venue of care.   HPI HPI: 62 year old with a history of syncope who presented with complete heart block requiring intubation 10/15-10/16, reintubated 10/17-10/29 for pacemaker insertion. Pt hypoxic hypercarbic and acidotic in cath labl for right femoral temporary pacing wire placement. Exhibited severe schizoaffective disorder, severe agitation, acute encephalopathy. CT head 10/24 > nil acute, mastoid effusion+, age advanced cerebral atrophy +. Previous MBS 09/29/18 (1 day pos-extubation) reccomended continue NPO.      SLP Plan  Continue with current plan of care       Recommendations  Diet recommendations: Dysphagia 1 (puree);Honey-thick liquid Liquids provided via: Teaspoon Medication Administration: Crushed with puree Supervision: Full supervision/cueing for compensatory strategies;Trained caregiver to feed patient;Patient able to self feed Compensations: Minimize environmental distractions;Slow rate;Small sips/bites;Multiple dry swallows after each bite/sip;Hard cough after swallow;Clear throat after each swallow Postural Changes and/or Swallow Maneuvers: Seated upright 90 degrees                Oral Care Recommendations: Oral care BID;Oral care prior to ice chip/H20 Follow up Recommendations: Inpatient Rehab SLP Visit Diagnosis: Dysphagia, unspecified (R13.10) Plan: Continue with current plan of care       Suzzette Righter, Student SLP                 Suzzette Righter 10/07/2018, 11:08 AM

## 2018-10-08 ENCOUNTER — Inpatient Hospital Stay (HOSPITAL_COMMUNITY): Payer: PRIVATE HEALTH INSURANCE

## 2018-10-08 LAB — GLUCOSE, CAPILLARY
GLUCOSE-CAPILLARY: 124 mg/dL — AB (ref 70–99)
GLUCOSE-CAPILLARY: 123 mg/dL — AB (ref 70–99)
Glucose-Capillary: 111 mg/dL — ABNORMAL HIGH (ref 70–99)
Glucose-Capillary: 125 mg/dL — ABNORMAL HIGH (ref 70–99)
Glucose-Capillary: 120 mg/dL — ABNORMAL HIGH (ref 70–99)

## 2018-10-08 LAB — COMPREHENSIVE METABOLIC PANEL WITH GFR
ALT: 192 U/L — ABNORMAL HIGH (ref 0–44)
AST: 74 U/L — ABNORMAL HIGH (ref 15–41)
Albumin: 3.2 g/dL — ABNORMAL LOW (ref 3.5–5.0)
Alkaline Phosphatase: 75 U/L (ref 38–126)
Anion gap: 15 (ref 5–15)
BUN: 32 mg/dL — ABNORMAL HIGH (ref 8–23)
CO2: 23 mmol/L (ref 22–32)
Calcium: 9.2 mg/dL (ref 8.9–10.3)
Chloride: 99 mmol/L (ref 98–111)
Creatinine, Ser: 1.17 mg/dL (ref 0.61–1.24)
GFR calc Af Amer: >60 mL/min
GFR calc non Af Amer: >60 mL/min
Glucose, Bld: 117 mg/dL — ABNORMAL HIGH (ref 70–99)
Potassium: 3.4 mmol/L — ABNORMAL LOW (ref 3.5–5.1)
Sodium: 137 mmol/L (ref 135–145)
Total Bilirubin: 1 mg/dL (ref 0.3–1.2)
Total Protein: 7.3 g/dL (ref 6.5–8.1)

## 2018-10-08 LAB — BASIC METABOLIC PANEL
Anion gap: 14 (ref 5–15)
BUN: 29 mg/dL — ABNORMAL HIGH (ref 8–23)
CO2: 26 mmol/L (ref 22–32)
Calcium: 9.6 mg/dL (ref 8.9–10.3)
Chloride: 98 mmol/L (ref 98–111)
Creatinine, Ser: 1.32 mg/dL — ABNORMAL HIGH (ref 0.61–1.24)
GFR calc Af Amer: >60 mL/min (ref >60)
GFR calc non Af Amer: 57 mL/min — ABNORMAL LOW (ref >60)
Glucose, Bld: 122 mg/dL — ABNORMAL HIGH (ref 70–99)
Potassium: 3.6 mmol/L (ref 3.5–5.1)
Sodium: 138 mmol/L (ref 135–145)

## 2018-10-08 LAB — CBC WITH DIFFERENTIAL/PLATELET
Abs Immature Granulocytes: 0.36 10*3/uL — ABNORMAL HIGH (ref 0.00–0.07)
Basophils Absolute: 0 10*3/uL (ref 0.0–0.1)
Basophils Relative: 0 %
Eosinophils Absolute: 0.1 10*3/uL (ref 0.0–0.5)
Eosinophils Relative: 0 %
HCT: 40.2 % (ref 39.0–52.0)
Hemoglobin: 12.8 g/dL — ABNORMAL LOW (ref 13.0–17.0)
Immature Granulocytes: 2 %
Lymphocytes Relative: 8 %
Lymphs Abs: 1.4 10*3/uL (ref 0.7–4.0)
MCH: 28.8 pg (ref 26.0–34.0)
MCHC: 31.8 g/dL (ref 30.0–36.0)
MCV: 90.3 fL (ref 80.0–100.0)
Monocytes Absolute: 1.2 10*3/uL — ABNORMAL HIGH (ref 0.1–1.0)
Monocytes Relative: 7 %
Neutro Abs: 14.8 10*3/uL — ABNORMAL HIGH (ref 1.7–7.7)
Neutrophils Relative %: 83 %
Platelets: 181 10*3/uL (ref 150–400)
RBC: 4.45 MIL/uL (ref 4.22–5.81)
RDW: 16.1 % — ABNORMAL HIGH (ref 11.5–15.5)
WBC: 17.8 10*3/uL — ABNORMAL HIGH (ref 4.0–10.5)
nRBC: 0 % (ref 0.0–0.2)

## 2018-10-08 LAB — MAGNESIUM: Magnesium: 2 mg/dL (ref 1.7–2.4)

## 2018-10-08 LAB — PROCALCITONIN: Procalcitonin: 0.13 ng/mL

## 2018-10-08 MED ORDER — HYOSCYAMINE SULFATE 0.125 MG SL SUBL
0.2500 mg | SUBLINGUAL_TABLET | Freq: Once | SUBLINGUAL | Status: AC
  Administered 2018-10-08: 0.25 mg via SUBLINGUAL
  Filled 2018-10-08: qty 2

## 2018-10-08 MED ORDER — OXYCODONE-ACETAMINOPHEN 5-325 MG PO TABS: 1.0000 | ORAL_TABLET | ORAL | Status: AC | PRN

## 2018-10-08 MED ORDER — BUPROPION HCL ER (XL) 150 MG PO TB24
300.0000 mg | ORAL_TABLET | Freq: Every day | ORAL | Status: DC
  Administered 2018-10-08: 300 mg via ORAL
  Filled 2018-10-08: qty 2

## 2018-10-08 NOTE — Progress Notes (Signed)
PROGRESS NOTE    Derelle Cockrell  QMV:[id number] DOB: [birth year] DOA: 09/14/2018 PCP: Patient, No Pcp Per  Outpatient Specialists:   Brief Narrative:  Patient is a 62 year old Caucasian male, with past medical history significant for depression, anxiety, syncope, was recently found to be in complete heart block requiring cardiac cath (normal coronaries) and temporary femoral pacer insertion on 09/14/2018.  As per prior documentations, patient was intubated for respiratory failure with hypoxia, hypercarbia, bloody frothy sputum / HCAP extensive infx BL.  Briefly on pressors for hypotension. Patient was extubated 10/29.   Hospital course has also been complicated by acute kidney injury, electrolyte abnormality, severe agitation necessitating use of Precedex and benzodiazepine, and acute encephalopathy that is resolving slowly.  Echo revealed normal EF, but the diastolic function could not be assessed.  Cardiac catheterization revealed mildly elevated LVEDP.  Patient's oxygen requirement is down to 1 L/min via nasal cannula, and the O2 sat is 95%.  Patient still looks weak.  Patient seems to get easily agitated.  10/07/2018.  Discussed and updated patient's wife.  No new complaints.  Awaiting rehabilitation input.   Otherwise, no fever or chills.  No chest pain.  Further management will depend on Hospital course  10/08/2018: Patient was seen alongside patient's nurse, nurse floor manager, patient's wife, patient's 2 sisters.  The speech therapist joined [place] later.  I updated all present extensively.  Explained to all that physical therapy has recommended rehabilitation.  Explained to all present therefore now, discharging patient to rehab facility will be deemed the safe discharge option.  However, due to financial constraints, the patient's wife and sisters have decided to discharge patient to an assisted living facility that is ownedon by the patient's sister upon discharge.  Patient, patient's wife,  patient's sisters all have capacity to receive and process medical information, and they voiced understanding the risk of patient being discharged to an assisted living facility rather than recommended rehab facility.  Patient's wife reported that patient had significant right upper quadrant pain last night.  Patient denies significant right upper quadrant pain at the moment.  No fever or chills.  Leukocytosis is noted.  We will proceed with chest x-ray and right upper quadrant ultrasound.  Patient had abdominal x-ray earlier that came back normal.  All questions from all present were answered.  No speech or aspiration symptoms reported.  Oral feeding is being tried at the moment.  SIGNIFICANT EVENTS:  09/14/18 Admitted with complete heart block requiring temporary pacer insertion and intubation. 10/16 Extubated. Started on precedex for agitation 10/17 Reintubated for permanent pacemaker insertion.  Kept on vent due to agitation. 10/19 Ongoing agitation, head CT and EEG are unremarkable. Started on broad abx for persistent fevers, trach seceretions 09/19/18 - bronch BAL 0 95% polys 10/24  - sevee agitation, neuro called. Restarted home cogentin, buspar, celexa and increase risperdal to high home dose, failed pecedex, back on diprivan 10/25 - - afebrile since 09/22/18 . Creat slightly up; vanc level 37 and vanc on hold. Needing both precedex and diprivan for agitation control. Home psych meds restarted  Yesterday. This AM followed command. Wife reports improved following commnads. But failed SBT due to tachypnea. Being diuresed - 21m Lasix tid. Improved balance +7L was +10L  10/26 - fluid balance down to +6.5L with diuresis. Aafebrfile. CReat rising again with lasix. WBC rising to 20K.  Failed sBT within few minutes.   09/26/18 - lasix reduced and now on hold following hypotension. +6.3L . Creat better after lasix hold and  fluid bolus ,   Had diarrhea but on laxatives  09/28/2018  extubated   STUDIES:    2D echo- 09/14/2018 Focal basal hypertrophy, LVEF 60-65%, paradoxical septal motion consistent with right ventricular pacing. CT head 09/16/2018- mild ventricular megaly, no acute intracranial findings, right frontal sinusitis. EEG 10/90/19-mild diffuse slowing, no seizures. CT head 10/24 > nil acute, mastoid effusion+, age advanced cerebral atrophy +  CULTURES:  HIV 10/15 - neg MRSA PCR 10/15 - positive Blood culture 10/17 -ng Blood cultures 10/19 -ng Trach aspirate 10/19 - nml flora  Resp BAL 10/20 > 95% polys. NORMal flora Urine culture 10/19-ng resp 10/27 >> nml flora  Assessment & Plan:   Principal Problem:   Delirium due to another medical condition Active Problems:   CHB (complete heart block) (HCC)   Complete heart block (HCC)   Encephalopathy acute   Pressure injury of skin   Debility   Schizoaffective disorder, depressive type (HCC)   Dysphagia   Leukocytosis   #Acute respiratory failure in the setting of complete heart block complicated by hypoxia and acidosis.  HCAP / aspiration pneumonia: - Patient is currently on oxygen  1L/Min via nasal cannula -Oxygen saturation is 95% -Continue Pulmonary toiletry -Mobilize patient -Physical therapy consult. -Rehab input is appreciated.  Severe schizoaffective disorder - multiple drugs Severe agitation, Acute encephalopathy -   Doubt serotonin syndrome, NMS.  History of anxiety, depression on psych meds x 30 yrs.  -Off of Precedex. -On Risperidone, Citalopram, Wellbutrin, buspirone - On PRN Haldol and PRN Xanax. - Minimize Mind altering medications -Stable for now.  #Complete heart block: - PPM placed 10/18. -Paced as needed  #AKI - resolved 09/18/18   -Resolved. -Resume Diuretics (Torsemide 80m po once daily) - Monitor renal function and electrolytes closely.   Hypernatremia: Resolved  Dysphagia - High aspiration risk - Speech therapy input - Tube feeds via  Cortrak. 10/04/2018: Speech input is appreciated.  Patient is to remain n.p.o.  Continue to fix for now.  #Sepsis Syndrome - Pneumonia +/- Sinus -Continue cefepime for 14 days total - Continue to monitor Leukocytosis.  Right upper quadrant pain/tenderness/leukocytosis: Chest x-ray. Right upper quadrant ultrasound. Continue to monitor WBC. Control patient's pain. Further management will depend on hospital course. Have a low threshold to proceed with CT scan of the abdomen and pelvis if the pain persists.  DVT prophylaxis: Subcu heparin. Code Status: Full code Family Communication: Wife and son Disposition Plan: Likely rehab   Consultants:   Patient has been seen by cardiology and pulmonary team  Procedures:   Pacemaker placement  Status post intubation and extubation  Antimicrobials:  Cefazolin 10/17- 10/18 Vanco 10/19 >10/26 Zosyn 10/19 > 10/27 Cefepime 10/27 >>   Subjective: No fever chills.   No shortness of breath or chest pain.   Patient looks better today.    Objective: Vitals:   10/07/18 1618 10/07/18 2011 10/08/18 0034 10/08/18 0500  BP: 122/70 (!) 141/70 111/86 132/87  Pulse: 88 84 86 93  Resp:    18  Temp: 98.6 F (37 C) 97.9 F (36.6 C)  98.4 F (36.9 C)  TempSrc: Oral Oral  Oral  SpO2: 96% 96%  94%  Weight:    84.2 kg  Height:        Intake/Output Summary (Last 24 hours) at 10/08/2018 1806 Last data filed at 10/08/2018 1302 Gross per 24 hour  Intake 90 ml  Output 525 ml  Net -435 ml   Filed Weights   10/06/18 0441 10/07/18 0300 10/08/18  0500  Weight: 88.2 kg 88.4 kg 84.2 kg    Examination:  General exam: Not in any distress.  Patient is awake and alert.      Respiratory system: Mild crackles left lung field posteriorly.   Cardiovascular system: S1 & S2 heard.  Mild leg edema.   Gastrointestinal system: Abdomen is obese, soft and nontender.  Organs are difficult to assess.   Central nervous system: Awake and alert.  Agitated.   Anxious.  Moves all limbs.   Extremities: Leg edema is improving.     Psychiatry: Anxious and agitated  Data Reviewed: I have personally reviewed following labs and imaging studies  CBC: Recent Labs  Lab 10/03/18 0318 10/04/18 0545 10/05/18 0423 10/06/18 0927 10/08/18 0208  WBC 15.9* 14.7* 14.3* 11.6* 17.8*  NEUTROABS 12.5* 11.1* 10.5* 8.5* 14.8*  HGB 10.4* 11.6* 11.6* 11.8* 12.8*  HCT 32.3* 36.6* 36.3* 37.4* 40.2  MCV 90.2 90.8 91.4 92.1 90.3  PLT 428* 365 348 289 829   Basic Metabolic Panel: Recent Labs  Lab 10/02/18 1054 10/03/18 0318 10/04/18 0545 10/05/18 0423 10/06/18 0927 10/08/18 0208 10/08/18 1240  NA 141 141 140 141 140 137 138  K 3.3* 3.2* 3.7 3.8 3.7 3.4* 3.6  CL 111 109 108 106 102 99 98  CO2 _0 GLUCOSE 126* 115* 124* 112* 130* 117* 122*  BUN 17 19 24* 30* 34* 32* 29*  CREATININE 0.84 0.88 0.82 0.90 0.89 1.17 1.32*  CALCIUM 8.4* 8.2* 8.7* 8.8* 8.8* 9.2 9.6  MG 2.1  --   --  2.2  --  2.0  --   PHOS 2.9 3.3 3.6 3.8 3.7  --   --    GFR: Estimated Creatinine Clearance: 64.5 mL/min (A) (by C-G formula based on SCr of 1.32 mg/dL (H)). Liver Function Tests: Recent Labs  Lab 10/03/18 0318 10/04/18 0545 10/05/18 0423 10/06/18 0927 10/08/18 0208  AST  --   --   --   --  74*  ALT  --   --   --   --  192*  ALKPHOS  --   --   --   --  75  BILITOT  --   --   --   --  1.0  PROT  --   --   --   --  7.3  ALBUMIN 2.3* 2.5* 2.6* 2.7* 3.2*   No results for input(s): LIPASE, AMYLASE in the last 168 hours. No results for input(s): AMMONIA in the last 168 hours. Coagulation Profile: No results for input(s): INR, PROTIME in the last 168 hours. Cardiac Enzymes: No results for input(s): CKTOTAL, CKMB, CKMBINDEX, TROPONINI in the last 168 hours. BNP (last 3 results) No results for input(s): PROBNP in the last 8760 hours. HbA1C: No results for input(s): HGBA1C in the last 72 hours. CBG: Recent Labs  Lab 10/07/18 2010 10/08/18 0106  10/08/18 0752 10/08/18 1247 10/08/18 1627  GLUCAP 103* 111* 124* 125* 120*   Lipid Profile: No results for input(s): CHOL, HDL, LDLCALC, TRIG, CHOLHDL, LDLDIRECT in the last 72 hours. Thyroid Function Tests: No results for input(s): TSH, T4TOTAL, FREET4, T3FREE, THYROIDAB in the last 72 hours. Anemia Panel: No results for input(s): VITAMINB12, FOLATE, FERRITIN, TIBC, IRON, RETICCTPCT in the last 72 hours. Urine analysis:    Component Value Date/Time   COLORURINE YELLOW 09/18/2018 0813   APPEARANCEUR CLEAR 09/18/2018 0813   LABSPEC 1.023 09/18/2018 0813   PHURINE 5.0 09/18/2018 0813   GLUCOSEU NEGATIVE  09/18/2018 0813   HGBUR LARGE (A) 09/18/2018 0813   BILIRUBINUR NEGATIVE 09/18/2018 0813   KETONESUR 5 (A) 09/18/2018 0813   PROTEINUR 100 (A) 09/18/2018 0813   NITRITE NEGATIVE 09/18/2018 0813   LEUKOCYTESUR NEGATIVE 09/18/2018 0813   Sepsis Labs: _0 (procalcitonin:4,lacticidven:4)  ) No results found for this or any previous visit (from the past 240 hour(s)).       Radiology Studies: Dg Chest Port 1 View  Result Date: 10/08/2018 CLINICAL DATA:  Pneumonia. EXAM: PORTABLE CHEST 1 VIEW COMPARISON:  10/04/2018 FINDINGS: Cardiac pacemaker and feeding catheter in stable position. Mildly enlarged cardiac silhouette. Patchy left lung airspace consolidation. Right mid lung field peripheral airspace opacity. Osseous structures are without acute abnormality. Soft tissues are grossly normal. IMPRESSION: No significant change from the latest radiograph in patchy left-sided airspace consolidation. Right mid lung field small area of airspace consolidation versus atelectasis. Electronically Signed   By: Fidela Salisbury M.D.   On: 10/08/2018 12:32   Dg Abd Portable 2v  Result Date: 10/08/2018 CLINICAL DATA:  Abdominal pain today. EXAM: PORTABLE ABDOMEN - 2 VIEW COMPARISON:  10/01/2018 FINDINGS: Enteric tube unchanged with tip in the midline of the mid abdomen likely over the  distal duodenum. Bowel gas pattern is nonobstructive with moderate contrast throughout the colon. No free peritoneal air. Remainder the exam is unchanged. IMPRESSION: Nonobstructive bowel gas pattern with moderate contrast throughout the colon. Enteric tube unchanged. Electronically Signed   By: Marin Olp M.D.   On: 10/08/2018 09:20   US Abdomen Limited Ruq  Result Date: 10/08/2018 CLINICAL DATA:  Abdominal pain for 1 day. EXAM: ULTRASOUND ABDOMEN LIMITED RIGHT UPPER QUADRANT COMPARISON:  None. FINDINGS: Gallbladder: There is some sludge in the gallbladder. Wall thickness is within normal limits at 1.2 mm. No sonographic Percell Miller sign is present. Common bile duct: Diameter: Not well visualized due to overlying bowel gas. Liver: The liver is echogenic. There is loss of normal internal architecture. No focal lesions are present. The left lobe is not well seen due to overlying bowel gas. Portal vein is patent on color Doppler imaging with normal direction of blood flow towards the liver. IMPRESSION: 1. Gallbladder sludge without evidence for stone or acute cholecystitis. 2. Liver is echogenic suggesting hepatic steatosis. 3. Common bile duct is not visualized due to overlying bowel gas. Electronically Signed   By: San Morelle M.D.   On: 10/08/2018 17:36        Scheduled Meds: . benztropine  1 mg Oral Daily  . buPROPion  300 mg Oral Daily  . busPIRone  15 mg Oral Daily  . chlorhexidine  15 mL Mouth/Throat BID  . citalopram  40 mg Oral Daily  . free water  30 mL Per Tube Q4H  . heparin  5,000 Units Subcutaneous Q8H  . pantoprazole sodium  40 mg Per Tube Q1200  . polyethylene glycol  17 g Oral Daily  . risperiDONE  2 mg Per Tube TID  . torsemide  20 mg Oral Daily   Continuous Infusions: . sodium chloride    . sodium chloride Stopped (10/03/18 0300)     LOS: 24 days   Time spent: 55 minutes.  Dana Allan, MD  [facility] Pager #: [phone] 7PM-7AM contact night  coverage as above

## 2018-10-08 NOTE — Progress Notes (Signed)
  Speech Language Pathology Treatment: Dysphagia  Patient Details Name: Billy Simon MRN: [id number] DOB: 04-27-1956 Today's Date: 10/08/2018 Time: 1130-1200 SLP Time Calculation (min) (ACUTE ONLY): 30 min  Assessment / Plan / Recommendation Clinical Impression  SLP present and participated in impromptu collaboration in pt's room including MD, RN, charge RN, wife and additional family members re: current medical status, prognosis, recommendations. This therapist reviewed goals of dysphagia intervention and progress. Removing his NGT will be dependent of safety and tolerance of recommendations and implementation of strategies but primarily volume of oral intake and if he is meeting nutritional needs. He exhibits fatigue when eating and intake may be lower as his strength and endurance improves. Pt has new pain in right lower area below ribs and MD ordered abdomen ultrasound revealing nonobstructive bowel gas pattern with moderate contrast throughout the colon. Repeat CXR showed no significant change from the latest radiograph in patchy left-sided airspace consolidation. Right mid lung field small area of airspace consolidation versus atelectasis. PO observation with pudding and honey thick liquids via tsp. He recalled/read swallow techniques with 70% accuracy and demonstrated significant impulsivity requiring manual cues to slow rate. Throat clear performed most of the time independently but without low respiratory effort exacerbated by pain on inhalation. He did not exhibit overt s/s aspiration. Reiterated to pt and wife aspiration risks persist despite diet modifications with goal of decreasing potential for aspiration. Continue Dys 1 (puree), honey, clear throat after swallow, swallow 2 times, liquids via teaspoon only. Encourage RN's to document pt's intake to assist in determining when NGT removal is appropriate.      HPI HPI: 62 year old with a history of syncope who presented with complete heart  block requiring intubation 10/15-10/16, reintubated 10/17-10/29 for pacemaker insertion. Pt hypoxic hypercarbic and acidotic in cath labl for right femoral temporary pacing wire placement. Exhibited severe schizoaffective disorder, severe agitation, acute encephalopathy. CT head 10/24 > nil acute, mastoid effusion+, age advanced cerebral atrophy +. Previous MBS 09/29/18 (1 day pos-extubation) reccomended continue NPO.      SLP Plan  Continue with current plan of care       Recommendations  Diet recommendations: Dysphagia 1 (puree);Honey-thick liquid Liquids provided via: Teaspoon Medication Administration: Crushed with puree Supervision: Full supervision/cueing for compensatory strategies;Trained caregiver to feed patient;Patient able to self feed Compensations: Minimize environmental distractions;Slow rate;Small sips/bites;Multiple dry swallows after each bite/sip;Hard cough after swallow;Clear throat after each swallow Postural Changes and/or Swallow Maneuvers: Seated upright 90 degrees                Oral Care Recommendations: Oral care BID;Oral care prior to ice chip/H20 Follow up Recommendations: Skilled Nursing facility SLP Visit Diagnosis: Dysphagia, pharyngeal phase (R13.13) Plan: Continue with current plan of care       GO                [name] 10/08/2018, 3:57 PM

## 2018-10-09 LAB — CBC WITH DIFFERENTIAL/PLATELET
Basophils Absolute: 0 10*3/uL (ref 0.0–0.1)
Basophils Relative: 0 %
Eosinophils Absolute: 0.1 10*3/uL (ref 0.0–0.5)
Eosinophils Relative: 1 %
HCT: 39.5 % (ref 39.0–52.0)
Hemoglobin: 12.7 g/dL — ABNORMAL LOW (ref 13.0–17.0)
Lymphocytes Relative: 12 %
Lymphs Abs: 1.5 10*3/uL (ref 0.7–4.0)
MCH: 29 pg (ref 26.0–34.0)
MCHC: 32.2 g/dL (ref 30.0–36.0)
MCV: 90.2 fL (ref 80.0–100.0)
Monocytes Absolute: 1.1 10*3/uL — ABNORMAL HIGH (ref 0.1–1.0)
Monocytes Relative: 9 %
Neutro Abs: 9.7 10*3/uL — ABNORMAL HIGH (ref 1.7–7.7)
Neutrophils Relative %: 75 %
Platelets: 183 10*3/uL (ref 150–400)
RBC: 4.38 MIL/uL (ref 4.22–5.81)
RDW: 16.3 % — ABNORMAL HIGH (ref 11.5–15.5)
WBC: 12.9 10*3/uL — ABNORMAL HIGH (ref 4.0–10.5)

## 2018-10-09 LAB — GLUCOSE, CAPILLARY
GLUCOSE-CAPILLARY: 117 mg/dL — AB (ref 70–99)
GLUCOSE-CAPILLARY: 122 mg/dL — AB (ref 70–99)
Glucose-Capillary: 115 mg/dL — ABNORMAL HIGH (ref 70–99)
Glucose-Capillary: 132 mg/dL — ABNORMAL HIGH (ref 70–99)
Glucose-Capillary: 123 mg/dL — ABNORMAL HIGH (ref 70–99)

## 2018-10-09 LAB — RENAL FUNCTION PANEL
Albumin: 3 g/dL — ABNORMAL LOW (ref 3.5–5.0)
Anion gap: 13 (ref 5–15)
BUN: 30 mg/dL — ABNORMAL HIGH (ref 8–23)
CO2: 24 mmol/L (ref 22–32)
Calcium: 9 mg/dL (ref 8.9–10.3)
Chloride: 100 mmol/L (ref 98–111)
Creatinine, Ser: 1.08 mg/dL (ref 0.61–1.24)
GFR calc Af Amer: >60 mL/min (ref >60)
GFR calc non Af Amer: >60 mL/min (ref >60)
Glucose, Bld: 113 mg/dL — ABNORMAL HIGH (ref 70–99)
Phosphorus: 3.8 mg/dL (ref 2.5–4.6)
Potassium: 5.2 mmol/L — ABNORMAL HIGH (ref 3.5–5.1)
Sodium: 137 mmol/L (ref 135–145)

## 2018-10-09 MED ORDER — BUPROPION HCL 100 MG PO TABS
100.0000 mg | ORAL_TABLET | Freq: Three times a day (TID) | ORAL | Status: DC
  Administered 2018-10-09 – 2018-10-11 (×8): 100 mg via ORAL
  Filled 2018-10-09 (×10): qty 1

## 2018-10-09 NOTE — Progress Notes (Addendum)
PROGRESS NOTE    Billy Simon  PPI:[id number] DOB: 1956-06-19 DOA: 09/14/2018 PCP: Patient, No Pcp Per  Outpatient Specialists:   Brief Narrative:  Patient is a 62 year old Caucasian male, with past medical history significant for depression, anxiety, syncope, was recently found to be in complete heart block requiring cardiac cath (normal coronaries) and temporary femoral pacer insertion on 09/14/2018.  As per prior documentations, patient was intubated for respiratory failure with hypoxia, hypercarbia, bloody frothy sputum / HCAP extensive infx BL.  Briefly on pressors for hypotension. Patient was extubated 10/29.   Hospital course has also been complicated by acute kidney injury, electrolyte abnormality, severe agitation necessitating use of Precedex and benzodiazepine, and acute encephalopathy that is resolving slowly.  Echo revealed normal EF, but the diastolic function could not be assessed.  Cardiac catheterization revealed mildly elevated LVEDP.  Patient's oxygen requirement is down to 1 L/min via nasal cannula, and the O2 sat is 95%.  Patient still looks weak.  Patient seems to get easily agitated.  10/07/2018.  Discussed and updated patient's wife.  No new complaints.  Awaiting rehabilitation input.   Otherwise, no fever or chills.  No chest pain.  Further management will depend on Hospital course  10/08/2018: Patient was seen alongside patient's nurse, nurse floor manager, patient's wife, patient's 2 sisters.  The speech therapist joined [place] later.  I updated all present extensively.  Explained to all that physical therapy has recommended rehabilitation.  Explained to all present therefore now, discharging patient to rehab facility will be deemed the safe discharge option.  However, due to financial constraints, the patient's wife and sisters have decided to discharge patient to an assisted living facility that is ownedon by the patient's sister upon discharge.  Patient, patient's wife,  patient's sisters all have capacity to receive and process medical information, and they voiced understanding the risk of patient being discharged to an assisted living facility rather than recommended rehab facility.  Patient's wife reported that patient had significant right upper quadrant pain last night.  Patient denies significant right upper quadrant pain at the moment.  No fever or chills.  Leukocytosis is noted.  We will proceed with chest x-ray and right upper quadrant ultrasound.  Patient had abdominal x-ray earlier that came back normal.  All questions from all present were answered.  No speech or aspiration symptoms reported.  Oral feeding is being tried at the moment.  10/09/2018: Patient seen alongside patient's wife and nurse.  Patient denies any right upper quadrant pain today.  Right upper quadrant ultrasound is nonrevealing.  Chest x-ray did not reveal any worsening findings.  Patient is tolerating oral feeds without aspiration symptoms.  Will discontinue NG tube.  Will start caloric count.  Will consult dietitian.  Hopefully, patient will be discharged back, the next 1 to 2 days.  SIGNIFICANT EVENTS:  09/14/18 Admitted with complete heart block requiring temporary pacer insertion and intubation. 10/16 Extubated. Started on precedex for agitation 10/17 Reintubated for permanent pacemaker insertion.  Kept on vent due to agitation. 10/19 Ongoing agitation, head CT and EEG are unremarkable. Started on broad abx for persistent fevers, trach seceretions 09/19/18 - bronch BAL 0 95% polys 10/24  - sevee agitation, neuro called. Restarted home cogentin, buspar, celexa and increase risperdal to high home dose, failed pecedex, back on diprivan 10/25 - - afebrile since 09/22/18 . Creat slightly up; vanc level 37 and vanc on hold. Needing both precedex and diprivan for agitation control. Home psych meds restarted  Yesterday. This AM  followed command. Wife reports improved following commnads. But  failed SBT due to tachypnea. Being diuresed - 50m Lasix tid. Improved balance +7L was +10L  10/26 - fluid balance down to +6.5L with diuresis. Aafebrfile. CReat rising again with lasix. WBC rising to 20K.  Failed sBT within few minutes.   09/26/18 - lasix reduced and now on hold following hypotension. +6.3L . Creat better after lasix hold and fluid bolus ,   Had diarrhea but on laxatives  09/28/2018 extubated   STUDIES:    2D echo- 09/14/2018 Focal basal hypertrophy, LVEF 60-65%, paradoxical septal motion consistent with right ventricular pacing. CT head 09/16/2018- mild ventricular megaly, no acute intracranial findings, right frontal sinusitis. EEG 10/90/19-mild diffuse slowing, no seizures. CT head 10/24 > nil acute, mastoid effusion+, age advanced cerebral atrophy +  CULTURES:  HIV 10/15 - neg MRSA PCR 10/15 - positive Blood culture 10/17 -ng Blood cultures 10/19 -ng Trach aspirate 10/19 - nml flora  Resp BAL 10/20 > 95% polys. NORMal flora Urine culture 10/19-ng resp 10/27 >> nml flora  Assessment & Plan:   Principal Problem:   Delirium due to another medical condition Active Problems:   CHB (complete heart block) (HCC)   Complete heart block (HCC)   Encephalopathy acute   Pressure injury of skin   Debility   Schizoaffective disorder, depressive type (HCC)   Dysphagia   Leukocytosis   #Acute respiratory failure in the setting of complete heart block complicated by hypoxia and acidosis.  HCAP / aspiration pneumonia: - Patient is currently on oxygen  1L/Min via nasal cannula -Oxygen saturation is 95% -Continue Pulmonary toiletry -Mobilize patient -Physical therapy consult. -Rehab input is appreciated.  Severe schizoaffective disorder - multiple drugs Severe agitation, Acute encephalopathy -   Doubt serotonin syndrome, NMS.  History of anxiety, depression on psych meds x 30 yrs.  -Off of Precedex. -On Risperidone, Citalopram, Wellbutrin, buspirone - On  PRN Haldol and PRN Xanax. - Minimize Mind altering medications -Stable for now.  #Complete heart block: - PPM placed 10/18. -Paced as needed  #AKI - resolved 09/18/18   -Resolved. -Resume Diuretics (Torsemide 287mpo once daily) - Monitor renal function and electrolytes closely.   Hypernatremia: Resolved  Dysphagia - High aspiration risk - Speech therapy input - Tube feeds via Cortrak. 10/04/2018: Speech input is appreciated.  Patient is to remain n.p.o.  Continue to fix for now.  #Sepsis Syndrome - Pneumonia +/- Sinus -Continue cefepime for 14 days total - Continue to monitor Leukocytosis.  Right upper quadrant pain/tenderness/leukocytosis: Chest x-ray. Right upper quadrant ultrasound. Continue to monitor WBC. Control patient's pain. Further management will depend on hospital course. Have a low threshold to proceed with CT scan of the abdomen and pelvis if the pain persists.  DVT prophylaxis: Subcu heparin. Code Status: Full code Family Communication: Wife and son Disposition Plan: Likely rehab   Consultants:   Patient has been seen by cardiology and pulmonary team  Procedures:   Pacemaker placement  Status post intubation and extubation  Antimicrobials:  Cefazolin 10/17- 10/18 Vanco 10/19 >10/26 Zosyn 10/19 > 10/27 Cefepime 10/27 >>   Subjective: No fever chills.   No shortness of breath or chest pain.   Patient looks better today.    Objective: Vitals:   10/09/18 0229 10/09/18 0829 10/09/18 1220 10/09/18 1636  BP: 130/69 126/76 (!) 148/70 129/77  Pulse: 94 90 85 84  Resp: _0 Temp:   97.8 F (36.6 C) 97.9 F (36.6 C)  TempSrc:  Axillary Axillary  SpO2:   98% 98%  Weight: 83 kg     Height:        Intake/Output Summary (Last 24 hours) at 10/09/2018 1810 Last data filed at 10/09/2018 1500 Gross per 24 hour  Intake 870 ml  Output 900 ml  Net -30 ml   Filed Weights   10/07/18 0300 10/08/18 0500 10/09/18 0229  Weight:  88.4 kg 84.2 kg 83 kg    Examination:  General exam: Not in any distress.  Patient is awake and alert.      Respiratory system: Mild crackles left lung field posteriorly.   Cardiovascular system: S1 & S2 heard.  Mild leg edema.   Gastrointestinal system: Abdomen is obese, soft and nontender.  Organs are difficult to assess.   Central nervous system: Awake and alert.  Agitated.  Anxious.  Moves all limbs.   Extremities: Leg edema is improving.     Psychiatry: Anxious and agitated  Data Reviewed: I have personally reviewed following labs and imaging studies  CBC: Recent Labs  Lab 10/04/18 0545 10/05/18 0423 10/06/18 0927 10/08/18 0208 10/09/18 0345  WBC 14.7* 14.3* 11.6* 17.8* 12.9*  NEUTROABS 11.1* 10.5* 8.5* 14.8* 9.7*  HGB 11.6* 11.6* 11.8* 12.8* 12.7*  HCT 36.6* 36.3* 37.4* 40.2 39.5  MCV 90.8 91.4 92.1 90.3 90.2  PLT 365 348 289 181 092   Basic Metabolic Panel: Recent Labs  Lab 10/03/18 0318 10/04/18 0545 10/05/18 0423 10/06/18 0927 10/08/18 0208 10/08/18 1240 10/09/18 0345  NA 141 140 141 140 137 138 137  K 3.2* 3.7 3.8 3.7 3.4* 3.6 5.2*  CL 109 108 106 102 99 98 100  CO2 _0 GLUCOSE 115* 124* 112* 130* 117* 122* 113*  BUN 19 24* 30* 34* 32* 29* 30*  CREATININE 0.88 0.82 0.90 0.89 1.17 1.32* 1.08  CALCIUM 8.2* 8.7* 8.8* 8.8* 9.2 9.6 9.0  MG  --   --  2.2  --  2.0  --   --   PHOS 3.3 3.6 3.8 3.7  --   --  3.8   GFR: Estimated Creatinine Clearance: 78.8 mL/min (by C-G formula based on SCr of 1.08 mg/dL). Liver Function Tests: Recent Labs  Lab 10/04/18 0545 10/05/18 0423 10/06/18 0927 10/08/18 0208 10/09/18 0345  AST  --   --   --  74*  --   ALT  --   --   --  192*  --   ALKPHOS  --   --   --  75  --   BILITOT  --   --   --  1.0  --   PROT  --   --   --  7.3  --   ALBUMIN 2.5* 2.6* 2.7* 3.2* 3.0*   No results for input(s): LIPASE, AMYLASE in the last 168 hours. No results for input(s): AMMONIA in the last 168 hours. Coagulation  Profile: No results for input(s): INR, PROTIME in the last 168 hours. Cardiac Enzymes: No results for input(s): CKTOTAL, CKMB, CKMBINDEX, TROPONINI in the last 168 hours. BNP (last 3 results) No results for input(s): PROBNP in the last 8760 hours. HbA1C: No results for input(s): HGBA1C in the last 72 hours. CBG: Recent Labs  Lab 10/08/18 2106 10/09/18 0054 10/09/18 0827 10/09/18 1214 10/09/18 1633  GLUCAP 123* 117* 122* 115* 132*   Lipid Profile: No results for input(s): CHOL, HDL, LDLCALC, TRIG, CHOLHDL, LDLDIRECT in the last 72 hours. Thyroid Function Tests: No results  for input(s): TSH, T4TOTAL, FREET4, T3FREE, THYROIDAB in the last 72 hours. Anemia Panel: No results for input(s): VITAMINB12, FOLATE, FERRITIN, TIBC, IRON, RETICCTPCT in the last 72 hours. Urine analysis:    Component Value Date/Time   COLORURINE YELLOW 09/18/2018 0813   APPEARANCEUR CLEAR 09/18/2018 0813   LABSPEC 1.023 09/18/2018 0813   PHURINE 5.0 09/18/2018 0813   GLUCOSEU NEGATIVE 09/18/2018 0813   HGBUR LARGE (A) 09/18/2018 0813   BILIRUBINUR NEGATIVE 09/18/2018 0813   KETONESUR 5 (A) 09/18/2018 0813   PROTEINUR 100 (A) 09/18/2018 0813   NITRITE NEGATIVE 09/18/2018 0813   LEUKOCYTESUR NEGATIVE 09/18/2018 0813   Sepsis Labs: _0 (procalcitonin:4,lacticidven:4)  ) No results found for this or any previous visit (from the past 240 hour(s)).       Radiology Studies: Dg Chest Port 1 View  Result Date: 10/08/2018 CLINICAL DATA:  Pneumonia. EXAM: PORTABLE CHEST 1 VIEW COMPARISON:  10/04/2018 FINDINGS: Cardiac pacemaker and feeding catheter in stable position. Mildly enlarged cardiac silhouette. Patchy left lung airspace consolidation. Right mid lung field peripheral airspace opacity. Osseous structures are without acute abnormality. Soft tissues are grossly normal. IMPRESSION: No significant change from the latest radiograph in patchy left-sided airspace consolidation. Right mid lung field  small area of airspace consolidation versus atelectasis. Electronically Signed   By: Fidela Salisbury M.D.   On: 10/08/2018 12:32   Dg Abd Portable 2v  Result Date: 10/08/2018 CLINICAL DATA:  Abdominal pain today. EXAM: PORTABLE ABDOMEN - 2 VIEW COMPARISON:  10/01/2018 FINDINGS: Enteric tube unchanged with tip in the midline of the mid abdomen likely over the distal duodenum. Bowel gas pattern is nonobstructive with moderate contrast throughout the colon. No free peritoneal air. Remainder the exam is unchanged. IMPRESSION: Nonobstructive bowel gas pattern with moderate contrast throughout the colon. Enteric tube unchanged. Electronically Signed   By: Marin Olp M.D.   On: 10/08/2018 09:20   US Abdomen Limited Ruq  Result Date: 10/08/2018 CLINICAL DATA:  Abdominal pain for 1 day. EXAM: ULTRASOUND ABDOMEN LIMITED RIGHT UPPER QUADRANT COMPARISON:  None. FINDINGS: Gallbladder: There is some sludge in the gallbladder. Wall thickness is within normal limits at 1.2 mm. No sonographic Percell Miller sign is present. Common bile duct: Diameter: Not well visualized due to overlying bowel gas. Liver: The liver is echogenic. There is loss of normal internal architecture. No focal lesions are present. The left lobe is not well seen due to overlying bowel gas. Portal vein is patent on color Doppler imaging with normal direction of blood flow towards the liver. IMPRESSION: 1. Gallbladder sludge without evidence for stone or acute cholecystitis. 2. Liver is echogenic suggesting hepatic steatosis. 3. Common bile duct is not visualized due to overlying bowel gas. Electronically Signed   By: San Morelle M.D.   On: 10/08/2018 17:36        Scheduled Meds: . benztropine  1 mg Oral Daily  . buPROPion  100 mg Oral TID  . busPIRone  15 mg Oral Daily  . chlorhexidine  15 mL Mouth/Throat BID  . citalopram  40 mg Oral Daily  . heparin  5,000 Units Subcutaneous Q8H  . pantoprazole sodium  40 mg Per Tube Q1200  .  polyethylene glycol  17 g Oral Daily  . risperiDONE  2 mg Per Tube TID  . torsemide  20 mg Oral Daily   Continuous Infusions: . sodium chloride    . sodium chloride Stopped (10/03/18 0300)     LOS: 25 days   Time spent: 25 minutes.  Yehuda Savannah  Marthenia Rolling, MD  [facility] Pager #: [phone] 7PM-7AM contact night coverage as above

## 2018-10-10 LAB — RENAL FUNCTION PANEL
Albumin: 3 g/dL — ABNORMAL LOW (ref 3.5–5.0)
Anion gap: 13 (ref 5–15)
BUN: 28 mg/dL — ABNORMAL HIGH (ref 8–23)
CO2: 29 mmol/L (ref 22–32)
Calcium: 9.6 mg/dL (ref 8.9–10.3)
Chloride: 95 mmol/L — ABNORMAL LOW (ref 98–111)
Creatinine, Ser: 1.17 mg/dL (ref 0.61–1.24)
GFR calc Af Amer: >60 mL/min (ref >60)
GFR calc non Af Amer: >60 mL/min (ref >60)
Glucose, Bld: 119 mg/dL — ABNORMAL HIGH (ref 70–99)
Phosphorus: 3.9 mg/dL (ref 2.5–4.6)
Potassium: 3 mmol/L — ABNORMAL LOW (ref 3.5–5.1)
Sodium: 137 mmol/L (ref 135–145)

## 2018-10-10 LAB — CBC WITH DIFFERENTIAL/PLATELET
Abs Immature Granulocytes: 0.24 10*3/uL — ABNORMAL HIGH (ref 0.00–0.07)
Basophils Absolute: 0.1 10*3/uL (ref 0.0–0.1)
Basophils Relative: 0 %
Eosinophils Absolute: 0.2 10*3/uL (ref 0.0–0.5)
Eosinophils Relative: 1 %
HCT: 42.3 % (ref 39.0–52.0)
Hemoglobin: 13.6 g/dL (ref 13.0–17.0)
Immature Granulocytes: 2 %
Lymphocytes Relative: 16 %
Lymphs Abs: 2.6 10*3/uL (ref 0.7–4.0)
MCH: 29.1 pg (ref 26.0–34.0)
MCHC: 32.2 g/dL (ref 30.0–36.0)
MCV: 90.4 fL (ref 80.0–100.0)
Monocytes Absolute: 1.6 10*3/uL — ABNORMAL HIGH (ref 0.1–1.0)
Monocytes Relative: 10 %
Neutro Abs: 11.3 10*3/uL — ABNORMAL HIGH (ref 1.7–7.7)
Neutrophils Relative %: 71 %
Platelets: 192 10*3/uL (ref 150–400)
RBC: 4.68 MIL/uL (ref 4.22–5.81)
RDW: 16 % — ABNORMAL HIGH (ref 11.5–15.5)
WBC: 16 10*3/uL — ABNORMAL HIGH (ref 4.0–10.5)
nRBC: 0 % (ref 0.0–0.2)

## 2018-10-10 LAB — MAGNESIUM: Magnesium: 2.1 mg/dL (ref 1.7–2.4)

## 2018-10-10 LAB — GLUCOSE, CAPILLARY
GLUCOSE-CAPILLARY: 120 mg/dL — AB (ref 70–99)
Glucose-Capillary: 108 mg/dL — ABNORMAL HIGH (ref 70–99)
Glucose-Capillary: 112 mg/dL — ABNORMAL HIGH (ref 70–99)
Glucose-Capillary: 119 mg/dL — ABNORMAL HIGH (ref 70–99)
Glucose-Capillary: 124 mg/dL — ABNORMAL HIGH (ref 70–99)

## 2018-10-10 MED ORDER — POTASSIUM CHLORIDE CRYS ER 20 MEQ PO TBCR
40.0000 meq | EXTENDED_RELEASE_TABLET | ORAL | Status: AC
  Administered 2018-10-10 (×2): 40 meq via ORAL
  Filled 2018-10-10 (×2): qty 2

## 2018-10-10 NOTE — Progress Notes (Addendum)
PROGRESS NOTE    Billy Simon  GEX:[id number] DOB: 28-Aug-1956 DOA: 09/14/2018 PCP: Patient, No Pcp Per  Outpatient Specialists:   Brief Narrative:  Patient is a 62 year old Caucasian male, with past medical history significant for depression, anxiety, syncope, was recently found to be in complete heart block requiring cardiac cath (normal coronaries) and temporary femoral pacer insertion on 09/14/2018.  As per prior documentations, patient was intubated for respiratory failure with hypoxia, hypercarbia, bloody frothy sputum / HCAP extensive infx BL.  Briefly on pressors for hypotension. Patient was extubated 10/29.   Hospital course has also been complicated by acute kidney injury, electrolyte abnormality, severe agitation necessitating use of Precedex and benzodiazepine, and acute encephalopathy that is resolving slowly.  Echo revealed normal EF, but the diastolic function could not be assessed.  Cardiac catheterization revealed mildly elevated LVEDP.  Patient's oxygen requirement is down to 1 L/min via nasal cannula, and the O2 sat is 95%.  Patient still looks weak.  Patient seems to get easily agitated.  10/07/2018.  Discussed and updated patient's wife.  No new complaints.  Awaiting rehabilitation input.   Otherwise, no fever or chills.  No chest pain.  Further management will depend on Hospital course  10/08/2018: Patient was seen alongside patient's nurse, nurse floor manager, patient's wife, patient's 2 sisters.  The speech therapist joined [place] later.  I updated all present extensively.  Explained to all that physical therapy has recommended rehabilitation.  Explained to all present therefore now, discharging patient to rehab facility will be deemed the safe discharge option.  However, due to financial constraints, the patient's wife and sisters have decided to discharge patient to an assisted living facility that is ownedon by the patient's sister upon discharge.  Patient, patient's wife,  patient's sisters all have capacity to receive and process medical information, and they voiced understanding the risk of patient being discharged to an assisted living facility rather than recommended rehab facility.  Patient's wife reported that patient had significant right upper quadrant pain last night.  Patient denies significant right upper quadrant pain at the moment.  No fever or chills.  Leukocytosis is noted.  We will proceed with chest x-ray and right upper quadrant ultrasound.  Patient had abdominal x-ray earlier that came back normal.  All questions from all present were answered.  No speech or aspiration symptoms reported.  Oral feeding is being tried at the moment.  10/09/2018: Patient seen alongside patient's wife and nurse.  Patient denies any right upper quadrant pain today.  Right upper quadrant ultrasound is nonrevealing.  Chest x-ray did not reveal any worsening findings.  Patient is tolerating oral feeds without aspiration symptoms.  Will discontinue NG tube.  Will start caloric count.  Will consult dietitian.  Hopefully, patient will be discharged back, the next 1 to 2 days.  10/10/18: Patient seen.  Good cough reflexes.  Patient continues to improve.  Leukocytosis noted.  Will repeat CBC in the morning.  Likely DC in a.m.  We will also check procalcitonin in the morning.  Chest x-ray is not suggestive of tuberculosis.  SIGNIFICANT EVENTS:  09/14/18 Admitted with complete heart block requiring temporary pacer insertion and intubation. 10/16 Extubated. Started on precedex for agitation 10/17 Reintubated for permanent pacemaker insertion.  Kept on vent due to agitation. 10/19 Ongoing agitation, head CT and EEG are unremarkable. Started on broad abx for persistent fevers, trach seceretions 09/19/18 - bronch BAL 0 95% polys 10/24  - sevee agitation, neuro called. Restarted home cogentin, buspar, celexa  and increase risperdal to high home dose, failed pecedex, back on diprivan 10/25  - - afebrile since 09/22/18 . Creat slightly up; vanc level 37 and vanc on hold. Needing both precedex and diprivan for agitation control. Home psych meds restarted  Yesterday. This AM followed command. Wife reports improved following commnads. But failed SBT due to tachypnea. Being diuresed - 63m Lasix tid. Improved balance +7L was +10L  10/26 - fluid balance down to +6.5L with diuresis. Aafebrfile. CReat rising again with lasix. WBC rising to 20K.  Failed sBT within few minutes.   09/26/18 - lasix reduced and now on hold following hypotension. +6.3L . Creat better after lasix hold and fluid bolus ,   Had diarrhea but on laxatives  09/28/2018 extubated   STUDIES:    2D echo- 09/14/2018 Focal basal hypertrophy, LVEF 60-65%, paradoxical septal motion consistent with right ventricular pacing. CT head 09/16/2018- mild ventricular megaly, no acute intracranial findings, right frontal sinusitis. EEG 10/90/19-mild diffuse slowing, no seizures. CT head 10/24 > nil acute, mastoid effusion+, age advanced cerebral atrophy +  CULTURES:  HIV 10/15 - neg MRSA PCR 10/15 - positive Blood culture 10/17 -ng Blood cultures 10/19 -ng Trach aspirate 10/19 - nml flora  Resp BAL 10/20 > 95% polys. NORMal flora Urine culture 10/19-ng resp 10/27 >> nml flora  Assessment & Plan:   Principal Problem:   Delirium due to another medical condition Active Problems:   CHB (complete heart block) (HCC)   Complete heart block (HCC)   Encephalopathy acute   Pressure injury of skin   Debility   Schizoaffective disorder, depressive type (HCC)   Dysphagia   Leukocytosis   #Acute respiratory failure in the setting of complete heart block complicated by hypoxia and acidosis.  HCAP / aspiration pneumonia: - Patient is currently on oxygen  1L/Min via nasal cannula -Oxygen saturation is 95% -Continue Pulmonary toiletry -Mobilize patient -Physical therapy consult. -Rehab input is appreciated.  Severe  schizoaffective disorder - multiple drugs Severe agitation, Acute encephalopathy -   Doubt serotonin syndrome, NMS.  History of anxiety, depression on psych meds x 30 yrs.  -Off of Precedex. -On Risperidone, Citalopram, Wellbutrin, buspirone - On PRN Haldol and PRN Xanax. - Minimize Mind altering medications -Stable for now.  #Complete heart block: - PPM placed 10/18. -Paced as needed  #AKI - resolved 09/18/18   -Resolved. -Resume Diuretics (Torsemide 262mpo once daily) - Monitor renal function and electrolytes closely.   Hypernatremia: Resolved  Dysphagia - High aspiration risk - Speech therapy input - Tube feeds via Cortrak. 10/04/2018: Speech input is appreciated.  Patient is to remain n.p.o.  Continue to fix for now.  #Sepsis Syndrome - Pneumonia +/- Sinus -Continue cefepime for 14 days total - Continue to monitor Leukocytosis.  Right upper quadrant pain/tenderness/leukocytosis: Chest x-ray. Right upper quadrant ultrasound. Continue to monitor WBC. Control patient's pain. Further management will depend on hospital course. Have a low threshold to proceed with CT scan of the abdomen and pelvis if the pain persists.  DVT prophylaxis: Subcu heparin. Code Status: Full code Family Communication: Wife and son Disposition Plan: Likely rehab   Consultants:   Patient has been seen by cardiology and pulmonary team  Procedures:   Pacemaker placement  Status post intubation and extubation  Antimicrobials:  Cefazolin 10/17- 10/18 Vanco 10/19 >10/26 Zosyn 10/19 > 10/27 Cefepime 10/27 >>   Subjective: No fever chills.   No shortness of breath or chest pain.   Patient looks better today.  Objective: Vitals:   10/09/18 1636 10/09/18 2041 10/10/18 0512 10/10/18 0836  BP: 129/77 119/81 110/85 138/82  Pulse: 84 90 96 96  Resp: _0 Temp: 97.9 F (36.6 C) 98.7 F (37.1 C) (!) 97.4 F (36.3 C) 98.2 F (36.8 C)  TempSrc: Axillary Oral Oral Oral    SpO2: 98% 95% 96% 95%  Weight:      Height:        Intake/Output Summary (Last 24 hours) at 10/10/2018 1030 Last data filed at 10/09/2018 2139 Gross per 24 hour  Intake 265 ml  Output 600 ml  Net -335 ml   Filed Weights   10/07/18 0300 10/08/18 0500 10/09/18 0229  Weight: 88.4 kg 84.2 kg 83 kg    Examination:  General exam: Not in any distress.  Patient is awake and alert.      Respiratory system: Mild crackles left lung field posteriorly.   Cardiovascular system: S1 & S2 heard.  Mild leg edema.   Gastrointestinal system: Abdomen is obese, soft and nontender.  Organs are difficult to assess.   Central nervous system: Awake and alert.  Agitated.  Anxious.  Moves all limbs.   Extremities: Leg edema is improving.     Psychiatry: Anxious and agitated  Data Reviewed: I have personally reviewed following labs and imaging studies  CBC: Recent Labs  Lab 10/05/18 0423 10/06/18 0927 10/08/18 0208 10/09/18 0345 10/10/18 0506  WBC 14.3* 11.6* 17.8* 12.9* 16.0*  NEUTROABS 10.5* 8.5* 14.8* 9.7* 11.3*  HGB 11.6* 11.8* 12.8* 12.7* 13.6  HCT 36.3* 37.4* 40.2 39.5 42.3  MCV 91.4 92.1 90.3 90.2 90.4  PLT 348 289 181 183 748   Basic Metabolic Panel: Recent Labs  Lab 10/04/18 0545 10/05/18 0423 10/06/18 0927 10/08/18 0208 10/08/18 1240 10/09/18 0345 10/10/18 0506  NA 140 141 140 137 138 137 137  K 3.7 3.8 3.7 3.4* 3.6 5.2* 3.0*  CL 108 106 102 99 98 100 95*  CO2 _1 GLUCOSE 124* 112* 130* 117* 122* 113* 119*  BUN 24* 30* 34* 32* 29* 30* 28*  CREATININE 0.82 0.90 0.89 1.17 1.32* 1.08 1.17  CALCIUM 8.7* 8.8* 8.8* 9.2 9.6 9.0 9.6  MG  --  2.2  --  2.0  --   --  2.1  PHOS 3.6 3.8 3.7  --   --  3.8 3.9   GFR: Estimated Creatinine Clearance: 72.8 mL/min (by C-G formula based on SCr of 1.17 mg/dL). Liver Function Tests: Recent Labs  Lab 10/05/18 0423 10/06/18 0927 10/08/18 0208 10/09/18 0345 10/10/18 0506  AST  --   --  74*  --   --   ALT  --   --   192*  --   --   ALKPHOS  --   --  75  --   --   BILITOT  --   --  1.0  --   --   PROT  --   --  7.3  --   --   ALBUMIN 2.6* 2.7* 3.2* 3.0* 3.0*   No results for input(s): LIPASE, AMYLASE in the last 168 hours. No results for input(s): AMMONIA in the last 168 hours. Coagulation Profile: No results for input(s): INR, PROTIME in the last 168 hours. Cardiac Enzymes: No results for input(s): CKTOTAL, CKMB, CKMBINDEX, TROPONINI in the last 168 hours. BNP (last 3 results) No results for input(s): PROBNP in the last 8760 hours. HbA1C: No results for input(s): HGBA1C  in the last 72 hours. CBG: Recent Labs  Lab 10/09/18 1633 10/09/18 2104 10/10/18 0013 10/10/18 0516 10/10/18 0742  GLUCAP 132* 123* 124* 112* 119*   Lipid Profile: No results for input(s): CHOL, HDL, LDLCALC, TRIG, CHOLHDL, LDLDIRECT in the last 72 hours. Thyroid Function Tests: No results for input(s): TSH, T4TOTAL, FREET4, T3FREE, THYROIDAB in the last 72 hours. Anemia Panel: No results for input(s): VITAMINB12, FOLATE, FERRITIN, TIBC, IRON, RETICCTPCT in the last 72 hours. Urine analysis:    Component Value Date/Time   COLORURINE YELLOW 09/18/2018 0813   APPEARANCEUR CLEAR 09/18/2018 0813   LABSPEC 1.023 09/18/2018 0813   PHURINE 5.0 09/18/2018 0813   GLUCOSEU NEGATIVE 09/18/2018 0813   HGBUR LARGE (A) 09/18/2018 0813   BILIRUBINUR NEGATIVE 09/18/2018 0813   KETONESUR 5 (A) 09/18/2018 0813   PROTEINUR 100 (A) 09/18/2018 0813   NITRITE NEGATIVE 09/18/2018 0813   LEUKOCYTESUR NEGATIVE 09/18/2018 0813   Sepsis Labs: _0 (procalcitonin:4,lacticidven:4)  ) No results found for this or any previous visit (from the past 240 hour(s)).       Radiology Studies: Dg Chest Port 1 View  Result Date: 10/08/2018 CLINICAL DATA:  Pneumonia. EXAM: PORTABLE CHEST 1 VIEW COMPARISON:  10/04/2018 FINDINGS: Cardiac pacemaker and feeding catheter in stable position. Mildly enlarged cardiac silhouette. Patchy left lung  airspace consolidation. Right mid lung field peripheral airspace opacity. Osseous structures are without acute abnormality. Soft tissues are grossly normal. IMPRESSION: No significant change from the latest radiograph in patchy left-sided airspace consolidation. Right mid lung field small area of airspace consolidation versus atelectasis. Electronically Signed   By: Fidela Salisbury M.D.   On: 10/08/2018 12:32   US Abdomen Limited Ruq  Result Date: 10/08/2018 CLINICAL DATA:  Abdominal pain for 1 day. EXAM: ULTRASOUND ABDOMEN LIMITED RIGHT UPPER QUADRANT COMPARISON:  None. FINDINGS: Gallbladder: There is some sludge in the gallbladder. Wall thickness is within normal limits at 1.2 mm. No sonographic Percell Miller sign is present. Common bile duct: Diameter: Not well visualized due to overlying bowel gas. Liver: The liver is echogenic. There is loss of normal internal architecture. No focal lesions are present. The left lobe is not well seen due to overlying bowel gas. Portal vein is patent on color Doppler imaging with normal direction of blood flow towards the liver. IMPRESSION: 1. Gallbladder sludge without evidence for stone or acute cholecystitis. 2. Liver is echogenic suggesting hepatic steatosis. 3. Common bile duct is not visualized due to overlying bowel gas. Electronically Signed   By: San Morelle M.D.   On: 10/08/2018 17:36        Scheduled Meds: . benztropine  1 mg Oral Daily  . buPROPion  100 mg Oral TID  . busPIRone  15 mg Oral Daily  . chlorhexidine  15 mL Mouth/Throat BID  . citalopram  40 mg Oral Daily  . heparin  5,000 Units Subcutaneous Q8H  . pantoprazole sodium  40 mg Per Tube Q1200  . polyethylene glycol  17 g Oral Daily  . potassium chloride  40 mEq Oral Q4H  . risperiDONE  2 mg Per Tube TID  . torsemide  20 mg Oral Daily   Continuous Infusions: . sodium chloride    . sodium chloride Stopped (10/03/18 0300)     LOS: 26 days   Time spent: 25  minutes.  Dana Allan, MD  [facility] Pager #: [phone] 7PM-7AM contact night coverage as above

## 2018-10-11 ENCOUNTER — Inpatient Hospital Stay (HOSPITAL_COMMUNITY): Payer: PRIVATE HEALTH INSURANCE

## 2018-10-11 LAB — CBC WITH DIFFERENTIAL/PLATELET
Abs Immature Granulocytes: 0.17 10*3/uL — ABNORMAL HIGH (ref 0.00–0.07)
Basophils Absolute: 0 10*3/uL (ref 0.0–0.1)
Basophils Relative: 0 %
Eosinophils Absolute: 0.3 10*3/uL (ref 0.0–0.5)
Eosinophils Relative: 3 %
HCT: 43.6 % (ref 39.0–52.0)
Hemoglobin: 13.6 g/dL (ref 13.0–17.0)
Immature Granulocytes: 1 %
Lymphocytes Relative: 16 %
Lymphs Abs: 2 10*3/uL (ref 0.7–4.0)
MCH: 28.2 pg (ref 26.0–34.0)
MCHC: 31.2 g/dL (ref 30.0–36.0)
MCV: 90.3 fL (ref 80.0–100.0)
Monocytes Absolute: 1.1 10*3/uL — ABNORMAL HIGH (ref 0.1–1.0)
Monocytes Relative: 9 %
Neutro Abs: 8.9 10*3/uL — ABNORMAL HIGH (ref 1.7–7.7)
Neutrophils Relative %: 71 %
Platelets: UNDETERMINED 10*3/uL (ref 150–400)
RBC: 4.83 MIL/uL (ref 4.22–5.81)
RDW: 15.3 % (ref 11.5–15.5)
WBC: 12.6 10*3/uL — ABNORMAL HIGH (ref 4.0–10.5)
nRBC: 0 % (ref 0.0–0.2)

## 2018-10-11 LAB — RENAL FUNCTION PANEL
Albumin: 3 g/dL — ABNORMAL LOW (ref 3.5–5.0)
Anion gap: 14 (ref 5–15)
BUN: 25 mg/dL — ABNORMAL HIGH (ref 8–23)
CO2: 26 mmol/L (ref 22–32)
Calcium: 9.3 mg/dL (ref 8.9–10.3)
Chloride: 94 mmol/L — ABNORMAL LOW (ref 98–111)
Creatinine, Ser: 1.09 mg/dL (ref 0.61–1.24)
GFR calc Af Amer: >60 mL/min (ref >60)
GFR calc non Af Amer: >60 mL/min (ref >60)
Glucose, Bld: 107 mg/dL — ABNORMAL HIGH (ref 70–99)
Phosphorus: 4.3 mg/dL (ref 2.5–4.6)
Potassium: 3.9 mmol/L (ref 3.5–5.1)
Sodium: 134 mmol/L — ABNORMAL LOW (ref 135–145)

## 2018-10-11 LAB — GLUCOSE, CAPILLARY
GLUCOSE-CAPILLARY: 98 mg/dL (ref 70–99)
Glucose-Capillary: 115 mg/dL — ABNORMAL HIGH (ref 70–99)
Glucose-Capillary: 134 mg/dL — ABNORMAL HIGH (ref 70–99)

## 2018-10-11 LAB — MAGNESIUM: Magnesium: 2 mg/dL (ref 1.7–2.4)

## 2018-10-11 LAB — PROCALCITONIN: Procalcitonin: <0.1 ng/mL

## 2018-10-11 MED ORDER — ALPRAZOLAM 0.5 MG PO TABS: 0.5000 mg | ORAL_TABLET | Freq: Two times a day (BID) | ORAL | 0 refills | Status: AC | PRN

## 2018-10-11 MED ORDER — RISPERIDONE 1 MG/ML PO SOLN: 2.0000 mg | Freq: Three times a day (TID) | ORAL | 0 refills | Status: AC

## 2018-10-11 MED ORDER — POLYETHYLENE GLYCOL 3350 17 G PO PACK: 17.0000 g | PACK | Freq: Every day | ORAL | 0 refills | Status: DC

## 2018-10-11 MED ORDER — PANTOPRAZOLE SODIUM 40 MG PO PACK: 40.0000 mg | PACK | Freq: Every day | ORAL | 0 refills | Status: DC

## 2018-10-11 MED ORDER — TORSEMIDE 20 MG PO TABS: 20.0000 mg | ORAL_TABLET | Freq: Every day | ORAL | 0 refills | Status: DC

## 2018-10-11 MED ORDER — CHLORHEXIDINE GLUCONATE 0.12 % MT SOLN: 15.0000 mL | Freq: Two times a day (BID) | OROMUCOSAL | 0 refills | Status: DC

## 2018-10-11 MED ORDER — BUSPIRONE HCL 15 MG PO TABS: 15.0000 mg | ORAL_TABLET | Freq: Every day | ORAL | 0 refills | Status: AC

## 2018-10-11 NOTE — Progress Notes (Signed)
Patient will DC to: [facility] Anticipated DC date: 10/11/18 Family notified: Family at bedside Transport by: ALF to pick pt up   Per MD patient ready for DC to [facility]. RN, patient, patient's family, and facility notified of DC. Discharge Summary and FL2 sent to facility along with chest x ray. RN to call report prior to discharge [phone]).   CSW will sign off for now as social work intervention is no longer needed. Please consult [place] again if new needs arise.  Cristobal Goldmann, LCSW Clinical Social Worker [phone]

## 2018-10-11 NOTE — Discharge Summary (Signed)
Physician Discharge Summary  Patient ID: Billy Simon MRN: [id number] DOB/AGE: 1956-05-29 62 y.o.  Admit date: 09/14/2018 Discharge date: 10/11/2018  Admission Diagnoses:  Discharge Diagnoses:  Acute respiratory failure with hypoxia and hypercapnia.   Aspiration pneumonia Sepsis. Complete heart block status post permanent pacemaker placement. Delirium due to another medical condition Acute kidney injury. Dysphagia. Pressure injury of skin Debility Schizoaffective disorder, depressive type ([place]) Dysphagia Leukocytosis   Discharged Condition: stable  Hospital Course: Patient is a 62 year old Caucasian male, with past medical history significant for depression, anxiety and syncope.  Apparently, patient was admitted to the hospital earlier in the year with syncope.  On the day of presentation, patient was said to have fallen back, was unresponsive and was "making noises".  EMS was called to the patient's home, and patient was found to be in complete heart block with heart rate in the 30s.  On presentation to the hospital, the patient was intubated, taken to the cardiac Cath Lab for coronary angiography and temporary cardiac pacemaker placement.  Cardiac cath revealed normal coronaries, normal left ventricular ejection fraction and LVEDP of 19 mmHg.  Electrophysiology team was eventually consulted for permanent pacemaker post placement.  Following intubation, patient was transferred to the ICU team.  Patient was managed for likely sepsis, aspiration pneumonia, acute kidney injury, hypernatremia, and management for schizoaffective disorder was continued.  Patient was eventually extubated and transferred to the hospitalist team for further care.  Treatment for aspiration pneumonia has been completed.  Patient has continued to pose aspiration risk.  Patient was evaluated by speech pathologist severally during the hospital stay.  NG tube was placed for tube feeds.  Patient gradually improved, and NG  tube was discontinued.  Patient was seen by the physical therapy team, and discharged to patient rehab facility ([facility]) was advised.  Due to financial challenges, the patient's wife and family have elected to take patient to an assisted living facility for further care.  The patient's wife, the patient and the patient's family understand that discharging patient to an assisted living facility is suboptimal, but the patient and the family are willing to do so.  Patient has been optimized and will be discharged to an assisted living facility patient, patient's wife and family wish.  SIGNIFICANT EVENTS:  09/14/18 Admitted with complete heart block requiring temporary pacer insertion and intubation. 10/16 Extubated. Started on precedex for agitation 10/17 Reintubated for permanent pacemaker insertion. Kept on vent due to agitation. 10/19 Ongoing agitation, head CT and EEG are unremarkable. Started on broad abx for persistent fevers, trach seceretions 09/19/18 - bronch BAL 0 95% polys 10/24 - severe agitation, neuro called. Restarted home cogentin, buspar, celexa and increase risperdal to high home dose, failed pecedex, back on diprivan 10/25 - - afebrile since 09/22/18 . Creat slightly up; vanc level 37 and vanc on hold. Needing both precedex and diprivan for agitation control. Home psych meds restarted Yesterday. This AM followed command. Wife reports improved following commnads. But failed SBT due to tachypnea. Being diuresed - 59m Lasix tid. Improved balance +7L was +10L  10/26 - fluidbalance down to +6.5L with diuresis. Aafebrfile. Creatinine rising again with lasix. WBC rising to 20K. Failed sBT (spontaneous breathing trials) within few minutes.   09/26/18 - lasix reduced and now on hold following hypotension. +6.3L . Creatiniine better after lasix was held, and fluid bolus , Had diarrhea but on laxatives  09/28/2018 extubated   STUDIES:  2D echo- 09/14/2018 Focal basal hypertrophy,  LVEF 60-65%, paradoxical septal motion consistent  with right ventricular pacing. CT head 09/16/2018- mild ventricular megaly, no acute intracranial findings, right frontal sinusitis. EEG 10/90/19-mild diffuse slowing, no seizures. CT head 10/24 > nil acute, mastoid effusion+, age advanced cerebral atrophy +  CULTURES:  HIV 10/15 - neg MRSA PCR 10/15 - positive Blood culture 10/17 -ng Blood cultures 10/19 -ng Trach aspirate 10/19 - nml flora  Resp BAL 10/20 > 95% polys. NORMal flora Urine culture 10/19-ng resp 10/27 >> nml flora  Acute respiratory failure in the setting of complete heart block complicated by hypoxia and acidosis. HCAP / aspiration pneumonia: - Patient was on supplemental oxygen during the hospital stay.  We will continue to wean down supplemental oxygen. -Oxygen saturation is 95% -Continued Pulmonary toiletry -Mobilize patient -Physical therapy consult. -Rehab input is appreciated.  Severe schizoaffective disorder - multiple drugs Severe agitation, Acute encephalopathy - Doubt serotonin syndrome, NMS.  History of anxiety, depression on psych meds x 30 yrs.  -Off Precedex. -On Risperidone, Citalopram, Wellbutrin, buspirone - On PRN Haldol and PRN Xanax. - Minimize Mind altering medications -Stable for now.  Complete heart block: -Permanent pacemaker placed on 09/17/18.. -Paced as needed  Acute kidney injury - resolved 09/18/18  -Resolved. -ResumeDiuretics (Torsemide 6m po once daily) -Continue to monitor renal function and electrolytes closely.   Hypernatremia: Resolved  Dysphagia - High aspiration risk - Speech therapy input -Patient was tube feeds via Cortrak (NG tube) -NG tube has been discontinued. -Diet as recommended by the speech therapy and the dietitians. -Continue aspiration precautions.  Sepsis Syndrome - Pneumonia +/- Sinus -Treated with IV Cefepime for 14 days. - Continue to monitor WBC.  Right upper quadrant  pain/tenderness/leukocytosis: Chest x-ray did not reveal any new findings.. Right upper quadrant ultrasound was negative for acute cholecystitis.. Continue to monitor WBC. Right upper quadrant pain has resolved.  Consults: cardiology, pulmonary/intensive care, neurology, psychiatry and Electrophysiology  Significant Diagnostic Studies:  Discharge Exam: Blood pressure 121/77, pulse 91, temperature 98.1 F (36.7 C), temperature source Oral, resp. rate 18, height 6' (1.829 m), weight 84.6 kg, SpO2 98 %.   Disposition: Discharge disposition: 01-Home or Self Care   Discharge Instructions    Diet - low sodium heart healthy   Complete by:  As directed    As advised by the speech therapist and the dietitian.   Increase activity slowly   Complete by:  As directed      Allergies as of 10/11/2018   No Known Allergies     Medication List    STOP taking these medications   risperiDONE 2 MG tablet Commonly known as:  RISPERDAL Replaced by:  risperiDONE 1 MG/ML oral solution     TAKE these medications   ALPRAZolam 0.5 MG tablet Commonly known as:  XANAX Take 1 tablet (0.5 mg total) by mouth 2 (two) times daily as needed for anxiety. What changed:    medication strength  how much to take  when to take this   benztropine 1 MG tablet Commonly known as:  COGENTIN Take 1 mg by mouth daily.   buPROPion 300 MG 24 hr tablet Commonly known as:  WELLBUTRIN XL Take 300 mg by mouth daily.   busPIRone 15 MG tablet Commonly known as:  BUSPAR Take 1 tablet (15 mg total) by mouth daily. Start taking on:  10/12/2018 What changed:  when to take this   chlorhexidine 0.12 % solution Commonly known as:  PERIDEX Use as directed 15 mLs in the mouth or throat 2 (two) times daily.   citalopram  40 MG tablet Commonly known as:  CELEXA Take 40 mg by mouth daily.   pantoprazole sodium 40 mg/20 mL Pack Commonly known as:  PROTONIX Place 20 mLs (40 mg total) into feeding tube daily at 12  noon. Start taking on:  10/12/2018   polyethylene glycol packet Commonly known as:  MIRALAX / GLYCOLAX Take 17 g by mouth daily. Start taking on:  10/12/2018   risperiDONE 1 MG/ML oral solution Commonly known as:  RISPERDAL Place 2 mLs (2 mg total) into feeding tube 3 (three) times daily. Replaces:  risperiDONE 2 MG tablet   torsemide 20 MG tablet Commonly known as:  DEMADEX Take 1 tablet (20 mg total) by mouth daily. Start taking on:  10/12/2018      Follow-up Information    [facility] Follow up on 09/29/2018.   Specialty:  Cardiology Why:  at [facility] information: [address], [place]       Constance Haw, MD Follow up on 12/20/2018.   Specialty:  Cardiology Why:  at 9:15AM Contact information: [place] [address] [phone]          35 minutes spent discharging this patient.  SignedBonnell Public 10/11/2018, 2:29 PM

## 2018-10-11 NOTE — NC FL2 (Signed)
[place] MEDICAID FL2 LEVEL OF CARE SCREENING TOOL     IDENTIFICATION  Patient Name: Billy Simon Birthdate: [birth year] Sex: male Admission Date (Current Location): 09/14/2018  [facility] and [place] Number:  Producer, television/film/video and Address:  [facility] [place], [address], [place], [place] [address]      Provider Number: [id number]  Attending Physician Name and Address:  Barnetta Chapel, MD  Relative Name and Phone Number:  Sencere Symonette, spouse, [phone]    Current Level of Care: Hospital Recommended Level of Care: Assisted Living Facility Prior Approval Number:    Date Approved/Denied:   PASRR Number: [id number]  Discharge Plan: Other (Comment)(ALF)    Current Diagnoses: Patient Active Problem List   Diagnosis Date Noted  . Debility   . Schizoaffective disorder, depressive type (HCC)   . Dysphagia   . Leukocytosis   . Delirium due to another medical condition   . Pressure injury of skin 09/27/2018  . Encephalopathy acute   . CHB (complete heart block) (HCC) 09/14/2018  . Complete heart block (HCC) 09/14/2018    Orientation RESPIRATION BLADDER Height & Weight     Self, Time, Situation, Place  Normal Continent Weight: 84.6 kg Height:  6' (182.9 cm)  BEHAVIORAL SYMPTOMS/MOOD NEUROLOGICAL BOWEL NUTRITION STATUS      Continent Diet(Regular-low sodium)  AMBULATORY STATUS COMMUNICATION OF NEEDS Skin   Limited Assist Verbally Surgical wounds, Other (Comment)(closed incision L chest, non PU L back, non PU R leg)                       Personal Care Assistance Level of Assistance  Bathing, Feeding, Dressing Bathing Assistance: Limited assistance Feeding assistance: Independent Dressing Assistance: Limited assistance     Functional Limitations Info  Sight, Hearing, Speech Sight Info: Adequate Hearing Info: Adequate Speech Info: Adequate    SPECIAL CARE FACTORS FREQUENCY  PT (By licensed PT), OT (By licensed OT), Speech  therapy     PT Frequency: 5x/weekly OT Frequency: 5x/weekly     Speech Therapy Frequency: please see DC summary      Contractures Contractures Info: Not present    Additional Factors Info  Code Status, Allergies, Isolation Precautions, Psychotropic Code Status Info: Full Allergies Info: No Known Allergies Psychotropic Info: celexa, risperdal   Isolation Precautions Info: Contact precuations      Current Medications (10/11/2018):   Discharge Medications: STOP taking these medications   risperiDONE 2 MG tablet Commonly known as:  RISPERDAL Replaced by:  risperiDONE 1 MG/ML oral solution     TAKE these medications   ALPRAZolam 0.5 MG tablet Commonly known as:  XANAX Take 1 tablet (0.5 mg total) by mouth 2 (two) times daily as needed for anxiety. What changed:    medication strength  how much to take  when to take this   benztropine 1 MG tablet Commonly known as:  COGENTIN Take 1 mg by mouth daily.   buPROPion 300 MG 24 hr tablet Commonly known as:  WELLBUTRIN XL Take 300 mg by mouth daily.   busPIRone 15 MG tablet Commonly known as:  BUSPAR Take 1 tablet (15 mg total) by mouth daily. Start taking on:  10/12/2018 What changed:  when to take this   chlorhexidine 0.12 % solution Commonly known as:  PERIDEX Use as directed 15 mLs in the mouth or throat 2 (two) times daily.   citalopram 40 MG tablet Commonly known as:  CELEXA Take 40 mg by mouth daily.  pantoprazole sodium 40 mg/20 mL Pack Commonly known as:  PROTONIX Place 20 mLs (40 mg total) into feeding tube daily at 12 noon. Start taking on:  10/12/2018   polyethylene glycol packet Commonly known as:  MIRALAX / GLYCOLAX Take 17 g by mouth daily. Start taking on:  10/12/2018   risperiDONE 1 MG/ML oral solution Commonly known as:  RISPERDAL Place 2 mLs (2 mg total) into feeding tube 3 (three) times daily. Replaces:  risperiDONE 2 MG tablet   torsemide 20 MG tablet Commonly known  as:  DEMADEX Take 1 tablet (20 mg total) by mouth daily. Start taking on:  10/12/2018     Relevant Imaging Results:  Relevant Lab Results:   Additional Information SSN: [id number]  Add home health services  Mearl Latin, LCSW

## 2018-10-11 NOTE — Progress Notes (Signed)
Nutrition Follow-up / Consult  DOCUMENTATION CODES:   Not applicable  INTERVENTION:    Magic cup TID with meals, each supplement provides 290 kcal and 9 grams of protein  NUTRITION DIAGNOSIS:   Inadequate oral intake related to dysphagia as evidenced by meal completion < 50%.  Ongoing   GOAL:   Patient will meet greater than or equal to 90% of their needs  Unmet  MONITOR:   PO intake, Supplement acceptance   ASSESSMENT:   62 yo Male with h/o complete heart block required a cath and temporary femoral pacer insertion on 09/14/2018.  Cortrak has been discontinued.  Plans for d/c to ALF today. Diet has been advanced to dysphagia 1 with honey thick liquids. Intake of meals mostly < 50%. RD to add PO supplements. Labs and medications reviewed.    Diet Order:   Diet Order            Diet - low sodium heart healthy        DIET - DYS 1 Room service appropriate? Yes; Fluid consistency: Honey Thick  Diet effective now              EDUCATION NEEDS:   Not appropriate for education at this time  Skin:  Skin Assessment: Skin Integrity Issues: Skin Integrity Issues:: Incisions, Other (Comment) Incisions: closed chest incision Other: Non pressure wound to R leg; 2 skin tears to back; MASD to sacrum & buttocks  Last BM:  11/11  Height:   Ht Readings from Last 1 Encounters:  09/19/18 6' (1.829 m)    Weight:   Wt Readings from Last 1 Encounters:  10/11/18 84.6 kg    Ideal Body Weight:  81 kg  BMI:  Body mass index is 25.29 kg/m.  Estimated Nutritional Needs:   Kcal:  2200-2400  Protein:  115-130 gm  Fluid:  2.2-2.4 L    Joaquin Courts, RD, LDN, CNSC Pager [phone] After Hours Pager [phone]

## 2018-10-11 NOTE — Plan of Care (Signed)
  Problem: Clinical Measurements: Goal: Ability to maintain clinical measurements within normal limits will improve Outcome: Progressing   Problem: Clinical Measurements: Goal: Respiratory complications will improve Outcome: Progressing   Problem: Clinical Measurements: Goal: Cardiovascular complication will be avoided Outcome: Progressing   

## 2018-10-11 NOTE — Care Management Note (Addendum)
Case Management Note Previous Note Created by Mable Fill  Patient Details  Name: Billy Simon MRN: [id number] Date of Birth: [birth year]  Subjective/Objective:                    Action/Plan:   Expected Discharge Date:                  Expected Discharge Plan:  IP Rehab Facility  In-House Referral:  NA  Discharge planning Services  CM Consult  Post Acute Care Choice:  NA Choice offered to:  NA  DME Arranged:  N/A DME Agency:  NA  HH Arranged:  NA HH Agency:  NA  Status of Service:  Completed, signed off  If discussed at Long Length of Stay Meetings, dates discussed:    Additional Comments: 10/11/2018 Raynald Blend, RN, BSN [phone] CM received request from pts wife to contact his sister Susy Manor directly [phone].  CM spoke with Ms Andrey Campanile and she informed CM that she was already aware of both the HHPT and Speech - sister/wife declined for CM to arrange [facility] - CM was informed that facility will arrange [facility] once pt arrives.  Pt has active prescription coverage.  NO other CM needs identified prior to the note being written.  CM signing off  CM informed by nurse that pt will likely discharge to ALF today - pt recommended for HHPT and Speech by attending.  Pts insurance does not cover therapy of any kind.     CM confirmed with [facility] [place] that pt has been accepted.  Facility prefers to use [facility] - CM contacted liaison and referral taken - agency to follow back up with CM regarding if pt will be accepted.     1216 10-07-18 Tomi Bamberger, RN,BSN [phone] CM did speak with CSW-plan for patient will be to transition to ALF [facility] once stable. CSW did make Inpatient Rehab Coordinator aware of plan of care. No further needs from CM at this time.  Cherylann Parr, RN 10/11/2018, 1:25 PM

## 2018-10-11 NOTE — Progress Notes (Signed)
Modified Barium Swallow Progress Note  Patient Details  Name: Billy Simon MRN: [id number] Date of Birth: 1956/08/11  Today's Date: 10/11/2018  Modified Barium Swallow completed.  Full report located under Chart Review in the Imaging Section.  Brief recommendations include the following:  Clinical Impression  Although swallow function slightly improved since last instrumental swallow test, pt still presents with moderate pharyngeal dysphagia leading to penetration of honey and nectar thick liquids as well as silent aspiration of thin. Reduced pharyngeal peristalsis evidenced across consistencies and contributed to the presence of moderate vallecular and pyriform sinus residue with thickened liquids and regular texture. Significantly reduced epiglottic inversion resulted in nectar penetration to the vocal folds X1, however implementation of head turn to the left was effective to provide additional airway protection, with no further instances of penetration or aspiration noted with this texture. Unfortunately, compensatory postural maneuvers were insufficient to prevent silent aspiration of thin liquids. Efficient oral transit and deglutition of regular texture PO observed without airway intrusion. Recommend upgrade to dysphagia 3 (mechanical soft) and nectar thick liquids with head turn to the left, clear throat and hard cough intermittently throughout meals. ST educated pt and wife regarding use of thickener to achieve nectar consistency, new compensatory strategies, encouraged continued use of expiratory muscle strength training (EMST) exercises, and advised to seek repeat MBS as outpatient in 2-3 weeks.   Swallow Evaluation Recommendations       SLP Diet Recommendations: Dysphagia 3 (Mech soft) solids;Nectar thick liquid   Liquid Administration via: Cup   Medication Administration: Crushed with puree   Supervision: Patient able to self feed;Full supervision/cueing for compensatory  strategies   Compensations: Minimize environmental distractions;Slow rate;Small sips/bites;Multiple dry swallows after each bite/sip;Clear throat intermittently   Postural Changes: Seated upright at 90 degrees   Oral Care Recommendations: Oral care BID        Blenda Mounts Laurice 10/11/2018,3:36 PM

## 2018-10-21 LAB — FUNGUS CULTURE WITH STAIN

## 2018-10-21 LAB — FUNGUS CULTURE RESULT

## 2018-11-01 LAB — ACID FAST CULTURE WITH REFLEXED SENSITIVITIES: ACID FAST CULTURE - AFSCU3: NEGATIVE

## 2018-11-01 LAB — ACID FAST CULTURE WITH REFLEXED SENSITIVITIES (MYCOBACTERIA)

## 2018-12-19 NOTE — Progress Notes (Signed)
Electrophysiology Office Note   Date:  12/20/2018   ID:  Billy Simon, Billy Simon January 05, 1956, MRN [id number]  PCP:  Shelbie Ammons, MD  Cardiologist:  [place] Primary Electrophysiologist:  Regan Lemming, MD    No chief complaint on file.    History of Present Illness: Billy Simon is a 63 y.o. male who is being seen today for the evaluation of complete AV block at the request of Hillis Range. Presenting today for electrophysiology evaluation.  He had multiple syncopal episodes and presented to [facility] October 2019 and complete heart block.  His hospital course was complicated for sepsis, aspiration pneumonia, acute kidney injury, and management of schizoaffective disorder.  He had a Saint Jude dual-chamber pacemaker implanted 09/16/2018.   Today, he denies symptoms of palpitations, chest pain, shortness of breath, orthopnea, PND, lower extremity edema, claudication, dizziness, presyncope, syncope, bleeding, or neurologic sequela. The patient is tolerating medications without difficulties.    Past Medical History:  Diagnosis Date  . CHB (complete heart block) (HCC) 09/14/2018  . Syncope 08/2018   Hospitalized at [facility]   Past Surgical History:  Procedure Laterality Date  . LEFT HEART CATH AND CORONARY ANGIOGRAPHY N/A 09/14/2018   Procedure: LEFT HEART CATH AND CORONARY ANGIOGRAPHY;  Surgeon: [place], Peter M, MD;  Location: [facility] INVASIVE CV LAB;  Service: Cardiovascular;  Laterality: N/A;  . PACEMAKER IMPLANT N/A 09/16/2018   Procedure: PACEMAKER IMPLANT;  Surgeon: Regan Lemming, MD;  Location: [facility] INVASIVE CV LAB;  Service: Cardiovascular;  Laterality: N/A;  . TEMPORARY PACEMAKER N/A 09/14/2018   Procedure: TEMPORARY PACEMAKER;  Surgeon: [place], Peter M, MD;  Location: [facility] INVASIVE CV LAB;  Service: Cardiovascular;  Laterality: N/A;     Current Outpatient Medications  Medication Sig Dispense Refill  . benztropine (COGENTIN) 1 MG tablet Take  1 mg by mouth daily.    Marland Kitchen buPROPion (WELLBUTRIN XL) 300 MG 24 hr tablet Take 300 mg by mouth daily.    . busPIRone (BUSPAR) 15 MG tablet Take 1 tablet (15 mg total) by mouth daily. 30 tablet 0  . citalopram (CELEXA) 40 MG tablet Take 40 mg by mouth daily.    . pantoprazole sodium (PROTONIX) 40 mg/20 mL PACK Place 20 mLs (40 mg total) into feeding tube daily at 12 noon. 30 each 0  . risperiDONE (RISPERDAL) 1 MG/ML oral solution Place 2 mLs (2 mg total) into feeding tube 3 (three) times daily. 30 mL 0  . ALPRAZolam (XANAX) 0.5 MG tablet Take 1 tablet (0.5 mg total) by mouth 2 (two) times daily as needed for anxiety. (Patient not taking: Reported on 12/20/2018) 20 tablet 0  . polyethylene glycol (MIRALAX / GLYCOLAX) packet Take 17 g by mouth daily. (Patient not taking: Reported on 12/20/2018) 14 each 0   No current facility-administered medications for this visit.     Allergies:   Patient has no known allergies.   Social History:  The patient  reports that he has never smoked. He has never used smokeless tobacco.   Family History:  The patient's family history includes Heart Problems in his father; Heart attack in his father; Leukemia in his mother.    ROS:  Please see the history of present illness.   Otherwise, review of systems is positive for none.   All other systems are reviewed and negative.    PHYSICAL EXAM: VS:  BP 132/72   Pulse 89   Ht 6' (1.829 m)   Wt 202 lb (91.6 kg)  BMI 27.40 kg/m  , BMI Body mass index is 27.4 kg/m. GEN: Well nourished, well developed, in no acute distress  HEENT: normal  Neck: no JVD, carotid bruits, or masses Cardiac: RRR; no murmurs, rubs, or gallops,no edema  Respiratory:  clear to auscultation bilaterally, normal work of breathing GI: soft, nontender, nondistended, + BS MS: no deformity or atrophy  Skin: warm and dry,  device pocket is well healed Neuro:  Strength and sensation are intact Psych: euthymic mood, full affect  EKG:  EKG is  ordered today. Personal review of the ekg ordered shows A sense, V paced  Device interrogation is reviewed today in detail.  See PaceArt for details.   Recent Labs: 09/14/2018: TSH 12.490 10/02/2018: B Natriuretic Peptide 110.3 10/08/2018: ALT 192 10/11/2018: BUN 25; Creatinine, Ser 1.09; Hemoglobin 13.6; Magnesium 2.0; Platelets PLATELET CLUMPS NOTED ON SMEAR, UNABLE TO ESTIMATE; Potassium 3.9; Sodium 134    Lipid Panel     Component Value Date/Time   TRIG 278 (H) 09/25/2018 0437     Wt Readings from Last 3 Encounters:  12/20/18 202 lb (91.6 kg)  10/11/18 186 lb 8 oz (84.6 kg)      Other studies Reviewed: Additional studies/ records that were reviewed today include: TTE 09/14/2018 Review of the above records today demonstrates: - Left ventricle: There was mild focal basal hypertrophy of the  septum. Systolic function was normal. The estimated ejection  fraction was in the range of 60% to 65%. The study is not  technically sufficient to allow evaluation of LV diastolic  function. Cannot exclude thrombus.  - Ventricular septum: Septal motion showed paradox. These changes  are consistent with right ventricular pacing.    ASSESSMENT AND PLAN:  1.  Complete heart block: Status post Saint Jude dual-chamber pacemaker implanted 09/16/2018.  He had a significantly long hospitalization and is continuing to improve from that.  Device functioning appropriately.  No changes at this time.    Current medicines are reviewed at length with the patient today.   The patient does not have concerns regarding his medicines.  The following changes were made today:  none  Labs/ tests ordered today include:  No orders of the defined types were placed in this encounter.    Disposition:   FU with   9 months  Signed,  Jorja Loa, MD  12/20/2018 9:51 AM     [facility] [address] [place] [address] [phone] (office) [phone]  (fax)

## 2018-12-20 ENCOUNTER — Encounter: Payer: Self-pay | Admitting: Cardiology

## 2018-12-20 ENCOUNTER — Ambulatory Visit (INDEPENDENT_AMBULATORY_CARE_PROVIDER_SITE_OTHER): Payer: PRIVATE HEALTH INSURANCE | Admitting: Cardiology

## 2018-12-20 VITALS — BP 132/72 | HR 89 | Ht 72.0 in | Wt 202.0 lb

## 2018-12-20 DIAGNOSIS — Z950 Presence of cardiac pacemaker: Secondary | ICD-10-CM

## 2018-12-20 DIAGNOSIS — I442 Atrioventricular block, complete: Secondary | ICD-10-CM

## 2018-12-20 LAB — CUP PACEART INCLINIC DEVICE CHECK
Battery Voltage: 2.99 V
Brady Statistic RA Percent Paced: 0.85 %
Date Time Interrogation Session: 20200120155647
Implantable Lead Implant Date: 20191017
Implantable Lead Location: 753859
Lead Channel Pacing Threshold Amplitude: 0.75 V
Lead Channel Pacing Threshold Amplitude: 0.75 V
Lead Channel Pacing Threshold Amplitude: 1 V
Lead Channel Pacing Threshold Pulse Width: 0.5 ms
Lead Channel Pacing Threshold Pulse Width: 0.5 ms
Lead Channel Pacing Threshold Pulse Width: 0.5 ms
Lead Channel Sensing Intrinsic Amplitude: 5 mV
Lead Channel Sensing Intrinsic Amplitude: 9.3 mV
Lead Channel Setting Pacing Amplitude: 2 V
Lead Channel Setting Pacing Pulse Width: 0.5 ms
MDC IDC LEAD IMPLANT DT: 20191017
MDC IDC LEAD LOCATION: 753860
MDC IDC MSMT BATTERY REMAINING LONGEVITY: 120 mo
MDC IDC MSMT LEADCHNL RA IMPEDANCE VALUE: 412.5 Ohm
MDC IDC MSMT LEADCHNL RA PACING THRESHOLD PULSEWIDTH: 0.5 ms
MDC IDC MSMT LEADCHNL RV IMPEDANCE VALUE: 587.5 Ohm
MDC IDC MSMT LEADCHNL RV PACING THRESHOLD AMPLITUDE: 0.875 V
MDC IDC PG IMPLANT DT: 20191017
MDC IDC SET LEADCHNL RV PACING AMPLITUDE: 1.125
MDC IDC SET LEADCHNL RV SENSING SENSITIVITY: 4 mV
MDC IDC STAT BRADY RV PERCENT PACED: 99.98 %
Pulse Gen Model: 2272
Pulse Gen Serial Number: 9070587

## 2018-12-20 NOTE — Patient Instructions (Addendum)
Medication Instructions:  Your physician recommends that you continue on your current medications as directed. Please refer to the Current Medication list given to you today.  *If you need a refill on your cardiac medications before your next appointment, please call your pharmacy*  Labwork: None ordered  Testing/Procedures: None ordered  Follow-Up: Remote monitoring is used to monitor your Pacemaker or ICD from home. This monitoring reduces the number of office visits required to check your device to one time per year. It allows [place] to keep an eye on the functioning of your device to ensure it is working properly. You are scheduled for a device check from home on 03/21/2019. You may send your transmission at any time that day. If you have a wireless device, the transmission will be sent automatically. After your physician reviews your transmission, you will receive a postcard with your next transmission date.  Your physician wants you to follow-up in: 9 months with Dr. Elberta Fortis.  You will receive a reminder letter in the mail two months in advance. If you don't receive a letter, please call our office to schedule the follow-up appointment.  Thank you for choosing [facility]!!   Dory Horn, RN [phone]  Any Other Special Instructions Will Be Listed Below (If Applicable).

## 2019-03-21 ENCOUNTER — Other Ambulatory Visit: Payer: Self-pay

## 2019-03-21 ENCOUNTER — Ambulatory Visit (INDEPENDENT_AMBULATORY_CARE_PROVIDER_SITE_OTHER): Payer: PRIVATE HEALTH INSURANCE | Admitting: *Deleted

## 2019-03-21 DIAGNOSIS — I442 Atrioventricular block, complete: Secondary | ICD-10-CM

## 2019-03-22 LAB — CUP PACEART REMOTE DEVICE CHECK
Battery Remaining Longevity: 116 mo
Battery Remaining Percentage: 95.5 %
Battery Voltage: 3.02 V
Brady Statistic AP VP Percent: 1.3 %
Brady Statistic AP VS Percent: 1 %
Brady Statistic AS VP Percent: 99 %
Brady Statistic AS VS Percent: 1 %
Brady Statistic RA Percent Paced: 1.2 %
Brady Statistic RV Percent Paced: 99 %
Date Time Interrogation Session: 20200420060018
Implantable Lead Implant Date: 20191017
Implantable Lead Implant Date: 20191017
Implantable Lead Location: 753859
Implantable Lead Location: 753860
Implantable Pulse Generator Implant Date: 20191017
Lead Channel Impedance Value: 410 Ohm
Lead Channel Impedance Value: 510 Ohm
Lead Channel Pacing Threshold Amplitude: 0.75 V
Lead Channel Pacing Threshold Amplitude: 0.875 V
Lead Channel Pacing Threshold Pulse Width: 0.5 ms
Lead Channel Pacing Threshold Pulse Width: 0.5 ms
Lead Channel Sensing Intrinsic Amplitude: 4.9 mV
Lead Channel Sensing Intrinsic Amplitude: 9.3 mV
Lead Channel Setting Pacing Amplitude: 1.125
Lead Channel Setting Pacing Amplitude: 2 V
Lead Channel Setting Pacing Pulse Width: 0.5 ms
Lead Channel Setting Sensing Sensitivity: 4 mV
Pulse Gen Model: 2272
Pulse Gen Serial Number: 9070587

## 2019-03-28 ENCOUNTER — Encounter: Payer: Self-pay | Admitting: Cardiology

## 2019-03-28 NOTE — Progress Notes (Signed)
Remote pacemaker transmission.   

## 2019-05-24 ENCOUNTER — Ambulatory Visit (INDEPENDENT_AMBULATORY_CARE_PROVIDER_SITE_OTHER): Payer: PRIVATE HEALTH INSURANCE | Admitting: Podiatry

## 2019-05-24 DIAGNOSIS — Z5329 Procedure and treatment not carried out because of patient's decision for other reasons: Secondary | ICD-10-CM

## 2019-05-24 NOTE — Progress Notes (Signed)
Same day call cancel no show.

## 2019-05-31 ENCOUNTER — Other Ambulatory Visit: Payer: Self-pay

## 2019-05-31 ENCOUNTER — Ambulatory Visit (INDEPENDENT_AMBULATORY_CARE_PROVIDER_SITE_OTHER): Payer: Medicaid Other | Admitting: Podiatry

## 2019-05-31 ENCOUNTER — Encounter: Payer: Self-pay | Admitting: Podiatry

## 2019-05-31 VITALS — Temp 99.1°F | Resp 16

## 2019-05-31 DIAGNOSIS — L600 Ingrowing nail: Secondary | ICD-10-CM

## 2019-05-31 DIAGNOSIS — M79676 Pain in unspecified toe(s): Secondary | ICD-10-CM

## 2019-05-31 MED ORDER — NEOMYCIN-POLYMYXIN-HC 3.5-10000-1 OT SOLN: OTIC | 0 refills | Status: DC

## 2019-05-31 NOTE — Patient Instructions (Signed)

## 2019-05-31 NOTE — Progress Notes (Signed)
Subjective:  Patient ID: Billy Simon, male    DOB: 05-Feb-1956,  MRN: [id number]  Chief Complaint  Patient presents with  . Nail Problem    Lt hallux medial border and Rt hallux lateral border x 2-3 wks; but going on for years; 7/10 throbbing intermittnt pain -= no injury Tx: trimming -pt denies drainage/redness/swelling   63 y.o. male presents with the above complaint.   Review of Systems: Negative except as noted in the HPI. Denies N/V/F/Ch.  Past Medical History:  Diagnosis Date  . CHB (complete heart block) (HCC) 09/14/2018  . Syncope 08/2018   Hospital[facility] at [facility]    Current Outpatient Medications:  .  ALPRAZolam (XANAX) 0.5 MG tablet, Take 1 tablet (0.5 mg total) by mouth 2 (two) times daily as needed for anxiety., Disp: 20 tablet, Rfl: 0 .  benztropine (COGENTIN) 1 MG tablet, Take 1 mg by mouth daily., Disp: , Rfl:  .  buPROPion (WELLBUTRIN XL) 300 MG 24 hr tablet, Take 300 mg by mouth daily., Disp: , Rfl:  .  busPIRone (BUSPAR) 15 MG tablet, Take 1 tablet (15 mg total) by mouth daily., Disp: 30 tablet, Rfl: 0 .  carbidopa-levodopa (SINEMET IR) 25-250 MG tablet, Take 1 tablet by mouth daily., Disp: , Rfl:  .  citalopram (CELEXA) 40 MG tablet, Take 40 mg by mouth daily., Disp: , Rfl:  .  meclizine (ANTIVERT) 25 MG tablet, Take 25 mg by mouth 3 (three) times daily as needed for dizziness., Disp: , Rfl:  .  pantoprazole (PROTONIX) 40 MG tablet, Take 40 mg by mouth daily., Disp: , Rfl:  .  risperiDONE (RISPERDAL) 1 MG/ML oral solution, Place 2 mLs (2 mg total) into feeding tube 3 (three) times daily., Disp: 30 mL, Rfl: 0 .  neomycin-polymyxin-hydrocortisone (CORTISPORIN) OTIC solution, Apply 2-3 drops to the ingrown toenail site twice daily. Cover with band-aid., Disp: 10 mL, Rfl: 0  Social History   Tobacco Use  Smoking Status Never Smoker  Smokeless Tobacco Never Used    No Known Allergies Objective:   Vitals:   05/31/19 1439  Resp: 16  Temp: 99.1  F (37.3 C)   There is no height or weight on file to calculate BMI. Constitutional Well developed. Well nourished.  Vascular Dorsalis pedis pulses palpable bilaterally. Posterior tibial pulses palpable bilaterally. Capillary refill normal to all digits.  No cyanosis or clubbing noted. Pedal hair growth normal.  Neurologic Normal speech. Oriented to person, place, and time. Epicritic sensation to light touch grossly present bilaterally.  Dermatologic Painful ingrowing nail at lateral nail borders of the right hallux nail, medial left. No other open wounds. No skin lesions.  Orthopedic: Normal joint ROM without pain or crepitus bilaterally. No visible deformities. No bony tenderness.   Radiographs: None Assessment:   1. Ingrown nail   2. Pain around toenail    Plan:  Patient was evaluated and treated and all questions answered.  Ingrown Nail, bilaterally -Patient elects to proceed with minor surgery to remove ingrown toenail removal today. Consent reviewed and signed by patient. -Ingrown nail excised. See procedure note. -Educated on post-procedure care including soaking. Written instructions provided and reviewed. -Patient to follow up in 2 weeks for nail check.  Procedure: Excision of Ingrown Toenail Location: Bilateral 1st toenails Anesthesia: Lidocaine 1% plain; 1.5 mL and Marcaine 0.5% plain; 1.5 mL, digital block. Skin Prep: Betadine. Dressing: Silvadene; telfa; dry, sterile, compression dressing. Technique: Following skin prep, the toe was exsanguinated and a tourniquet was secured at the  base of the toe. The affected nail border was freed, split with a nail splitter, and excised. Chemical matrixectomy was then performed with phenol and irrigated out with alcohol. The tourniquet was then removed and sterile dressing applied. Disposition: Patient tolerated procedure well. Patient to return in 2 weeks for follow-up.   Return in about 2 weeks (around 06/14/2019) for Nail  Check, Bilateral.

## 2019-06-14 ENCOUNTER — Other Ambulatory Visit: Payer: Self-pay

## 2019-06-14 ENCOUNTER — Ambulatory Visit (INDEPENDENT_AMBULATORY_CARE_PROVIDER_SITE_OTHER): Payer: Medicaid Other | Admitting: Podiatry

## 2019-06-14 ENCOUNTER — Encounter: Payer: Self-pay | Admitting: Podiatry

## 2019-06-14 VITALS — Temp 98.4°F | Resp 16

## 2019-06-14 DIAGNOSIS — L600 Ingrowing nail: Secondary | ICD-10-CM | POA: Diagnosis not present

## 2019-06-14 DIAGNOSIS — B351 Tinea unguium: Secondary | ICD-10-CM

## 2019-06-14 DIAGNOSIS — M79675 Pain in left toe(s): Secondary | ICD-10-CM

## 2019-06-14 DIAGNOSIS — M79674 Pain in right toe(s): Secondary | ICD-10-CM

## 2019-06-20 ENCOUNTER — Ambulatory Visit (INDEPENDENT_AMBULATORY_CARE_PROVIDER_SITE_OTHER): Payer: Self-pay | Admitting: *Deleted

## 2019-06-20 DIAGNOSIS — I442 Atrioventricular block, complete: Secondary | ICD-10-CM

## 2019-06-22 LAB — CUP PACEART REMOTE DEVICE CHECK
Date Time Interrogation Session: 20200722085251
Implantable Lead Implant Date: 20191017
Implantable Lead Implant Date: 20191017
Implantable Lead Location: 753859
Implantable Lead Location: 753860
Implantable Pulse Generator Implant Date: 20191017
Pulse Gen Model: 2272
Pulse Gen Serial Number: 7090587

## 2019-06-23 NOTE — Progress Notes (Signed)
  Subjective:  Patient ID: Billy Simon, male    DOB: 1956-08-14,  MRN: [id number]  Chief Complaint  Patient presents with  . nail check    F/U BL nail check Pt. states," much better." -pt denies pain/redness/swelling/warmth/driange -w/ redness at Lt nail bed Tx: epsom salt soaking and drops   . debride    Pt. want BL nail triming -pt sighned Medicaid ABN   63 y.o. male returns for the above complaint.   Objective:   General AA&O x3. Normal mood and affect.  Vascular Foot warm and well perfused with good capillary refill.  Neurologic Sensation grossly intact.  Dermatologic Nail avulsion site healing well without drainage or erythema. Nail bed with overlying soft crust. Left intact. No signs of local infection.  Orthopedic: No tenderness to palpation of the toe.   Assessment & Plan:  Patient was evaluated and treated and all questions answered.  S/p Ingrown Toenail Excision, bilaterally -Healing well without issue. -Discussed return precautions. -F/u PRN  Remaining nails trimmed x8

## 2019-07-04 ENCOUNTER — Encounter: Payer: Self-pay | Admitting: Cardiology

## 2019-07-04 NOTE — Progress Notes (Signed)
Remote pacemaker transmission.   

## 2019-09-16 DIAGNOSIS — Z950 Presence of cardiac pacemaker: Secondary | ICD-10-CM | POA: Diagnosis not present

## 2019-09-16 DIAGNOSIS — I639 Cerebral infarction, unspecified: Secondary | ICD-10-CM | POA: Diagnosis not present

## 2019-09-16 DIAGNOSIS — I361 Nonrheumatic tricuspid (valve) insufficiency: Secondary | ICD-10-CM | POA: Diagnosis not present

## 2019-09-17 DIAGNOSIS — I639 Cerebral infarction, unspecified: Secondary | ICD-10-CM | POA: Diagnosis not present

## 2019-09-17 DIAGNOSIS — Z950 Presence of cardiac pacemaker: Secondary | ICD-10-CM | POA: Diagnosis not present

## 2019-09-19 ENCOUNTER — Ambulatory Visit (INDEPENDENT_AMBULATORY_CARE_PROVIDER_SITE_OTHER): Payer: Medicaid Other | Admitting: *Deleted

## 2019-09-19 DIAGNOSIS — R55 Syncope and collapse: Secondary | ICD-10-CM

## 2019-09-19 DIAGNOSIS — I442 Atrioventricular block, complete: Secondary | ICD-10-CM

## 2019-09-19 LAB — CUP PACEART REMOTE DEVICE CHECK
Date Time Interrogation Session: 20201019205245
Implantable Lead Implant Date: 20191017
Implantable Lead Implant Date: 20191017
Implantable Lead Location: 753859
Implantable Lead Location: 753860
Implantable Pulse Generator Implant Date: 20191017
Pulse Gen Model: 2272
Pulse Gen Serial Number: 7090587

## 2019-10-07 NOTE — Progress Notes (Signed)
Remote pacemaker transmission.   

## 2019-12-13 ENCOUNTER — Telehealth: Payer: Self-pay | Admitting: Cardiology

## 2019-12-13 NOTE — Telephone Encounter (Signed)
Patient's wife contacted and she reports Billy Simon is sleeping. She reports patient fell a month ago and may have hit his chest at [facility] site and has bruising underneath incision site over ppm. Pacemaker was implanted in 08/2018. Patient haas some soreness at area of bruising but has not taken any medication because it is not  "bad" enough to take Tylenol. No drainage or swelling at incision  Site or at site of bruising. Wife reports bruising has decreased and patient has follow up with Dr Elberta Fortis 01/02/20. Remote transmission sent and device function WNL. Alerts and episodes are short episodes of PMT and do not coincide with time patient fell. Patient will send photo of area. Wife reports patient was seen by Greggory Stallion NP at PCP office 12/12/19 and he recommended contacting our office. Notes not available in [facility]. Recommended patient keep follow up appointment 01/02/20 and to call office if he has swelling, drainage or pain at [facility] site.Wife stated she understood instructions.

## 2019-12-13 NOTE — Telephone Encounter (Signed)
Please call patients wife he is having bruising around the pacemaker sight nd she is very concerned.  She states that he said it ti sore and she wants him seen. I schedule for 2-1 in [place] but they will do whatever to get him checked out.

## 2019-12-19 ENCOUNTER — Ambulatory Visit (INDEPENDENT_AMBULATORY_CARE_PROVIDER_SITE_OTHER): Payer: Medicaid Other | Admitting: *Deleted

## 2019-12-19 DIAGNOSIS — I442 Atrioventricular block, complete: Secondary | ICD-10-CM | POA: Diagnosis not present

## 2019-12-19 LAB — CUP PACEART REMOTE DEVICE CHECK
Date Time Interrogation Session: 20210118084312
Implantable Lead Implant Date: 20191017
Implantable Lead Implant Date: 20191017
Implantable Lead Location: 753859
Implantable Lead Location: 753860
Implantable Pulse Generator Implant Date: 20191017
Pulse Gen Model: 2272
Pulse Gen Serial Number: 7090587

## 2020-01-02 ENCOUNTER — Ambulatory Visit (INDEPENDENT_AMBULATORY_CARE_PROVIDER_SITE_OTHER): Payer: Medicaid Other | Admitting: Cardiology

## 2020-01-02 ENCOUNTER — Encounter: Payer: Self-pay | Admitting: Cardiology

## 2020-01-02 ENCOUNTER — Other Ambulatory Visit: Payer: Self-pay

## 2020-01-02 VITALS — BP 118/74 | HR 81 | Ht 72.0 in | Wt 214.6 lb

## 2020-01-02 DIAGNOSIS — I442 Atrioventricular block, complete: Secondary | ICD-10-CM

## 2020-01-02 DIAGNOSIS — Z950 Presence of cardiac pacemaker: Secondary | ICD-10-CM

## 2020-01-02 LAB — CUP PACEART INCLINIC DEVICE CHECK
Battery Remaining Longevity: 123 mo
Battery Voltage: 3.01 V
Brady Statistic RA Percent Paced: 2.9 %
Brady Statistic RV Percent Paced: 99.98 %
Date Time Interrogation Session: 20210201105918
Implantable Lead Implant Date: 20191017
Implantable Lead Implant Date: 20191017
Implantable Lead Location: 753859
Implantable Lead Location: 753860
Implantable Pulse Generator Implant Date: 20191017
Lead Channel Impedance Value: 462.5 Ohm
Lead Channel Impedance Value: 475 Ohm
Lead Channel Pacing Threshold Amplitude: 0.75 V
Lead Channel Pacing Threshold Amplitude: 0.75 V
Lead Channel Pacing Threshold Amplitude: 1 V
Lead Channel Pacing Threshold Pulse Width: 0.5 ms
Lead Channel Pacing Threshold Pulse Width: 0.5 ms
Lead Channel Pacing Threshold Pulse Width: 0.5 ms
Lead Channel Sensing Intrinsic Amplitude: 12 mV
Lead Channel Sensing Intrinsic Amplitude: 5 mV
Lead Channel Setting Pacing Amplitude: 1.25 V
Lead Channel Setting Pacing Amplitude: 2 V
Lead Channel Setting Pacing Pulse Width: 0.5 ms
Lead Channel Setting Sensing Sensitivity: 4 mV
Pulse Gen Model: 2272
Pulse Gen Serial Number: 9070587

## 2020-01-02 NOTE — Progress Notes (Signed)
Electrophysiology Office Note   Date:  01/02/2020   ID:  Zedric, Deroy 1956/03/11, MRN [id number]  PCP:  Galvin Proffer, MD  Cardiologist:  [place] Primary Electrophysiologist:  Regan Lemming, MD    Chief Complaint: CHB   History of Present Illness: Billy Simon is a 64 y.o. male who is being seen today for the evaluation of CHB at the request of Hague, Myrene Galas, MD. Presenting today for electrophysiology evaluation.  He has a history significant for complete heart block and syncope and is now status post Saint Jude dual-chamber pacemaker implanted 09/16/2018.  Today, he denies symptoms of palpitations, chest pain, shortness of breath, orthopnea, PND, lower extremity edema, claudication, dizziness, presyncope, syncope, bleeding, or neurologic sequela. The patient is tolerating medications without difficulties.    Past Medical History:  Diagnosis Date  . CHB (complete heart block) (HCC) 09/14/2018  . Syncope 08/2018   Hospitalized at [facility]   Past Surgical History:  Procedure Laterality Date  . LEFT HEART CATH AND CORONARY ANGIOGRAPHY N/A 09/14/2018   Procedure: LEFT HEART CATH AND CORONARY ANGIOGRAPHY;  Surgeon: Sw[place]il[place], Peter M, MD;  Location: [facility] INVASIVE CV LAB;  Service: Cardiovascular;  Laterality: N/A;  . PACEMAKER IMPLANT N/A 09/16/2018   Procedure: PACEMAKER IMPLANT;  Surgeon: Regan Lemming, MD;  Location: [facility] INVASIVE CV LAB;  Service: Cardiovascular;  Laterality: N/A;  . TEMPORARY PACEMAKER N/A 09/14/2018   Procedure: TEMPORARY PACEMAKER;  Surgeon: [place]il[place], Peter M, MD;  Location: [facility] INVASIVE CV LAB;  Service: Cardiovascular;  Laterality: N/A;     Current Outpatient Medications  Medication Sig Dispense Refill  . ALPRAZolam (XANAX) 0.5 MG tablet Take 1 tablet (0.5 mg total) by mouth 2 (two) times daily as needed for anxiety. 20 tablet 0  . benztropine (COGENTIN) 1 MG tablet Take 1 mg by mouth daily.    Marland Kitchen buPROPion (WELLBUTRIN XL)  300 MG 24 hr tablet Take 300 mg by mouth daily.    . busPIRone (BUSPAR) 15 MG tablet Take 1 tablet (15 mg total) by mouth daily. 30 tablet 0  . carbidopa-levodopa (SINEMET IR) 25-250 MG tablet Take 1 tablet by mouth daily.    . citalopram (CELEXA) 40 MG tablet Take 40 mg by mouth daily.    . meclizine (ANTIVERT) 25 MG tablet Take 25 mg by mouth 3 (three) times daily as needed for dizziness.    . neomycin-polymyxin-hydrocortisone (CORTISPORIN) OTIC solution Apply 2-3 drops to the ingrown toenail site twice daily. Cover with band-aid. 10 mL 0  . pantoprazole (PROTONIX) 40 MG tablet Take 40 mg by mouth daily.    . risperiDONE (RISPERDAL) 1 MG/ML oral solution Place 2 mLs (2 mg total) into feeding tube 3 (three) times daily. 30 mL 0   No current facility-administered medications for this visit.    Allergies:   Patient has no known allergies.   Social History:  The patient  reports that he has never smoked. He has never used smokeless tobacco. He reports that he does not drink alcohol.   Family History:  The patient's family history includes Heart Problems in his father; Heart attack in his father; Leukemia in his mother.    ROS:  Please see the history of present illness.   Otherwise, review of systems is positive for none.   All other systems are reviewed and negative.    PHYSICAL EXAM: VS:  BP 118/74   Pulse 81   Ht 6' (1.829 m)   Wt 214 lb 9.6  oz (97.3 kg)   SpO2 96%   BMI 29.10 kg/m  , BMI Body mass index is 29.1 kg/m. GEN: Well nourished, well developed, in no acute distress  HEENT: normal  Neck: no JVD, carotid bruits, or masses Cardiac: RRR; no murmurs, rubs, or gallops,no edema  Respiratory:  clear to auscultation bilaterally, normal work of breathing GI: soft, nontender, nondistended, + BS MS: no deformity or atrophy  Skin: warm and dry, device pocket is well healed Neuro:  Strength and sensation are intact Psych: euthymic mood, full affect  EKG:  EKG is ordered  today. Personal review of the ekg ordered shows atrial sensed, ventricular paced  Device interrogation is reviewed today in detail.  See PaceArt for details.   Recent Labs: No results found for requested labs within last 8760 hours.    Lipid Panel     Component Value Date/Time   TRIG 278 (H) 09/25/2018 0437     Wt Readings from Last 3 Encounters:  01/02/20 214 lb 9.6 oz (97.3 kg)  12/20/18 202 lb (91.6 kg)  10/11/18 186 lb 8 oz (84.6 kg)      Other studies Reviewed: Additional studies/ records that were reviewed today include: TTE 09/15/19  Review of the above records today demonstrates:   - Left ventricle: There was mild focal basal hypertrophy of the  septum. Systolic function was normal. The estimated ejection  fraction was in the range of 60% to 65%. The study is not  technically sufficient to allow evaluation of LV diastolic  function. Cannot exclude thrombus.  - Ventricular septum: Septal motion showed paradox. These changes  are consistent with right ventricular pacing.    ASSESSMENT AND PLAN:  1.  Complete heart block: Status post Saint Jude dual-chamber pacemaker implanted 09/16/2018.  Device functioning appropriately.  No changes at this time.    Current medicines are reviewed at length with the patient today.   The patient does not have concerns regarding his medicines.  The following changes were made today:  none  Labs/ tests ordered today include:  Orders Placed This Encounter  Procedures  . EKG 12-Lead     Disposition:   FU with Kaily Wragg 1 year  Signed, Shaneque Merkle Meredith Leeds, MD  01/02/2020 9:08 AM     [facility] [address] [place] [address] [phone] (office) [phone] (fax)

## 2020-03-19 ENCOUNTER — Ambulatory Visit (INDEPENDENT_AMBULATORY_CARE_PROVIDER_SITE_OTHER): Payer: Medicaid Other | Admitting: *Deleted

## 2020-03-19 DIAGNOSIS — I442 Atrioventricular block, complete: Secondary | ICD-10-CM

## 2020-03-19 LAB — CUP PACEART REMOTE DEVICE CHECK
Battery Remaining Longevity: 121 mo
Battery Remaining Percentage: 95.5 %
Battery Voltage: 3.01 V
Brady Statistic AP VP Percent: 2.6 %
Brady Statistic AP VS Percent: 1 %
Brady Statistic AS VP Percent: 97 %
Brady Statistic AS VS Percent: 1 %
Brady Statistic RA Percent Paced: 2.6 %
Brady Statistic RV Percent Paced: 99 %
Date Time Interrogation Session: 20210419030247
Implantable Lead Implant Date: 20191017
Implantable Lead Implant Date: 20191017
Implantable Lead Location: 753859
Implantable Lead Location: 753860
Implantable Pulse Generator Implant Date: 20191017
Lead Channel Impedance Value: 440 Ohm
Lead Channel Impedance Value: 460 Ohm
Lead Channel Pacing Threshold Amplitude: 0.75 V
Lead Channel Pacing Threshold Amplitude: 1 V
Lead Channel Pacing Threshold Pulse Width: 0.5 ms
Lead Channel Pacing Threshold Pulse Width: 0.5 ms
Lead Channel Sensing Intrinsic Amplitude: 12 mV
Lead Channel Sensing Intrinsic Amplitude: 5 mV
Lead Channel Setting Pacing Amplitude: 1.25 V
Lead Channel Setting Pacing Amplitude: 2 V
Lead Channel Setting Pacing Pulse Width: 0.5 ms
Lead Channel Setting Sensing Sensitivity: 4 mV
Pulse Gen Model: 2272
Pulse Gen Serial Number: 9070587

## 2020-03-19 NOTE — Progress Notes (Signed)
PPM Remote  

## 2020-06-18 ENCOUNTER — Ambulatory Visit (INDEPENDENT_AMBULATORY_CARE_PROVIDER_SITE_OTHER): Payer: Medicaid Other | Admitting: *Deleted

## 2020-06-18 DIAGNOSIS — I442 Atrioventricular block, complete: Secondary | ICD-10-CM | POA: Diagnosis not present

## 2020-06-18 LAB — CUP PACEART REMOTE DEVICE CHECK
Battery Remaining Longevity: 120 mo
Battery Remaining Percentage: 95.5 %
Battery Voltage: 3.01 V
Brady Statistic AP VP Percent: 5.3 %
Brady Statistic AP VS Percent: 1 %
Brady Statistic AS VP Percent: 95 %
Brady Statistic AS VS Percent: 1 %
Brady Statistic RA Percent Paced: 5.2 %
Brady Statistic RV Percent Paced: 99 %
Date Time Interrogation Session: 20210719093924
Implantable Lead Implant Date: 20191017
Implantable Lead Implant Date: 20191017
Implantable Lead Location: 753859
Implantable Lead Location: 753860
Implantable Pulse Generator Implant Date: 20191017
Lead Channel Impedance Value: 430 Ohm
Lead Channel Impedance Value: 480 Ohm
Lead Channel Pacing Threshold Amplitude: 0.75 V
Lead Channel Pacing Threshold Amplitude: 1 V
Lead Channel Pacing Threshold Pulse Width: 0.5 ms
Lead Channel Pacing Threshold Pulse Width: 0.5 ms
Lead Channel Sensing Intrinsic Amplitude: 12 mV
Lead Channel Sensing Intrinsic Amplitude: 5 mV
Lead Channel Setting Pacing Amplitude: 1.25 V
Lead Channel Setting Pacing Amplitude: 2 V
Lead Channel Setting Pacing Pulse Width: 0.5 ms
Lead Channel Setting Sensing Sensitivity: 4 mV
Pulse Gen Model: 2272
Pulse Gen Serial Number: 9070587

## 2020-06-19 NOTE — Progress Notes (Signed)
Remote pacemaker transmission.   

## 2020-09-17 ENCOUNTER — Ambulatory Visit (INDEPENDENT_AMBULATORY_CARE_PROVIDER_SITE_OTHER): Payer: Medicaid Other

## 2020-09-17 DIAGNOSIS — I442 Atrioventricular block, complete: Secondary | ICD-10-CM | POA: Diagnosis not present

## 2020-09-18 LAB — CUP PACEART REMOTE DEVICE CHECK
Battery Remaining Longevity: 118 mo
Battery Remaining Percentage: 95.5 %
Battery Voltage: 3.01 V
Brady Statistic AP VP Percent: 5.9 %
Brady Statistic AP VS Percent: 1 %
Brady Statistic AS VP Percent: 94 %
Brady Statistic AS VS Percent: 1 %
Brady Statistic RA Percent Paced: 5.9 %
Brady Statistic RV Percent Paced: 99 %
Date Time Interrogation Session: 20211018142121
Implantable Lead Implant Date: 20191017
Implantable Lead Implant Date: 20191017
Implantable Lead Location: 753859
Implantable Lead Location: 753860
Implantable Pulse Generator Implant Date: 20191017
Lead Channel Impedance Value: 430 Ohm
Lead Channel Impedance Value: 460 Ohm
Lead Channel Pacing Threshold Amplitude: 0.75 V
Lead Channel Pacing Threshold Amplitude: 1 V
Lead Channel Pacing Threshold Pulse Width: 0.5 ms
Lead Channel Pacing Threshold Pulse Width: 0.5 ms
Lead Channel Sensing Intrinsic Amplitude: 12 mV
Lead Channel Sensing Intrinsic Amplitude: 5 mV
Lead Channel Setting Pacing Amplitude: 1.25 V
Lead Channel Setting Pacing Amplitude: 2 V
Lead Channel Setting Pacing Pulse Width: 0.5 ms
Lead Channel Setting Sensing Sensitivity: 4 mV
Pulse Gen Model: 2272
Pulse Gen Serial Number: 9070587

## 2020-09-20 NOTE — Progress Notes (Signed)
Remote pacemaker transmission.   

## 2020-11-02 ENCOUNTER — Observation Stay (HOSPITAL_COMMUNITY): Payer: Medicaid Other

## 2020-11-02 ENCOUNTER — Encounter (HOSPITAL_COMMUNITY): Payer: Self-pay | Admitting: Emergency Medicine

## 2020-11-02 ENCOUNTER — Emergency Department (HOSPITAL_COMMUNITY): Payer: Medicaid Other

## 2020-11-02 ENCOUNTER — Other Ambulatory Visit: Payer: Self-pay

## 2020-11-02 ENCOUNTER — Inpatient Hospital Stay (HOSPITAL_COMMUNITY)
Admission: EM | Admit: 2020-11-02 | Discharge: 2020-12-01 | DRG: 207 | Disposition: E | Payer: Medicaid Other | Attending: Pulmonary Disease | Admitting: Pulmonary Disease

## 2020-11-02 DIAGNOSIS — J690 Pneumonitis due to inhalation of food and vomit: Secondary | ICD-10-CM | POA: Diagnosis not present

## 2020-11-02 DIAGNOSIS — Z8249 Family history of ischemic heart disease and other diseases of the circulatory system: Secondary | ICD-10-CM

## 2020-11-02 DIAGNOSIS — E875 Hyperkalemia: Secondary | ICD-10-CM | POA: Diagnosis not present

## 2020-11-02 DIAGNOSIS — G9340 Encephalopathy, unspecified: Secondary | ICD-10-CM | POA: Diagnosis not present

## 2020-11-02 DIAGNOSIS — R0602 Shortness of breath: Secondary | ICD-10-CM

## 2020-11-02 DIAGNOSIS — R0902 Hypoxemia: Secondary | ICD-10-CM

## 2020-11-02 DIAGNOSIS — R7989 Other specified abnormal findings of blood chemistry: Secondary | ICD-10-CM | POA: Diagnosis not present

## 2020-11-02 DIAGNOSIS — J15211 Pneumonia due to Methicillin susceptible Staphylococcus aureus: Secondary | ICD-10-CM | POA: Diagnosis not present

## 2020-11-02 DIAGNOSIS — R6521 Severe sepsis with septic shock: Secondary | ICD-10-CM | POA: Diagnosis not present

## 2020-11-02 DIAGNOSIS — R4182 Altered mental status, unspecified: Secondary | ICD-10-CM

## 2020-11-02 DIAGNOSIS — F251 Schizoaffective disorder, depressive type: Secondary | ICD-10-CM | POA: Diagnosis present

## 2020-11-02 DIAGNOSIS — G9341 Metabolic encephalopathy: Secondary | ICD-10-CM | POA: Diagnosis present

## 2020-11-02 DIAGNOSIS — D6489 Other specified anemias: Secondary | ICD-10-CM | POA: Diagnosis present

## 2020-11-02 DIAGNOSIS — E870 Hyperosmolality and hypernatremia: Secondary | ICD-10-CM | POA: Diagnosis not present

## 2020-11-02 DIAGNOSIS — Y848 Other medical procedures as the cause of abnormal reaction of the patient, or of later complication, without mention of misadventure at the time of the procedure: Secondary | ICD-10-CM | POA: Diagnosis not present

## 2020-11-02 DIAGNOSIS — E872 Acidosis: Secondary | ICD-10-CM | POA: Diagnosis not present

## 2020-11-02 DIAGNOSIS — J1282 Pneumonia due to coronavirus disease 2019: Secondary | ICD-10-CM | POA: Diagnosis present

## 2020-11-02 DIAGNOSIS — D62 Acute posthemorrhagic anemia: Secondary | ICD-10-CM | POA: Diagnosis not present

## 2020-11-02 DIAGNOSIS — Z6834 Body mass index (BMI) 34.0-34.9, adult: Secondary | ICD-10-CM

## 2020-11-02 DIAGNOSIS — R451 Restlessness and agitation: Secondary | ICD-10-CM | POA: Diagnosis not present

## 2020-11-02 DIAGNOSIS — U071 COVID-19: Principal | ICD-10-CM | POA: Diagnosis present

## 2020-11-02 DIAGNOSIS — N170 Acute kidney failure with tubular necrosis: Secondary | ICD-10-CM | POA: Diagnosis not present

## 2020-11-02 DIAGNOSIS — R7401 Elevation of levels of liver transaminase levels: Secondary | ICD-10-CM

## 2020-11-02 DIAGNOSIS — E871 Hypo-osmolality and hyponatremia: Secondary | ICD-10-CM | POA: Diagnosis present

## 2020-11-02 DIAGNOSIS — Z4659 Encounter for fitting and adjustment of other gastrointestinal appliance and device: Secondary | ICD-10-CM

## 2020-11-02 DIAGNOSIS — T380X5A Adverse effect of glucocorticoids and synthetic analogues, initial encounter: Secondary | ICD-10-CM | POA: Diagnosis not present

## 2020-11-02 DIAGNOSIS — A4101 Sepsis due to Methicillin susceptible Staphylococcus aureus: Secondary | ICD-10-CM | POA: Diagnosis not present

## 2020-11-02 DIAGNOSIS — Y95 Nosocomial condition: Secondary | ICD-10-CM | POA: Diagnosis not present

## 2020-11-02 DIAGNOSIS — Z950 Presence of cardiac pacemaker: Secondary | ICD-10-CM

## 2020-11-02 DIAGNOSIS — I82433 Acute embolism and thrombosis of popliteal vein, bilateral: Secondary | ICD-10-CM | POA: Diagnosis not present

## 2020-11-02 DIAGNOSIS — N401 Enlarged prostate with lower urinary tract symptoms: Secondary | ICD-10-CM | POA: Diagnosis present

## 2020-11-02 DIAGNOSIS — Z452 Encounter for adjustment and management of vascular access device: Secondary | ICD-10-CM

## 2020-11-02 DIAGNOSIS — L8990 Pressure ulcer of unspecified site, unspecified stage: Secondary | ICD-10-CM | POA: Diagnosis present

## 2020-11-02 DIAGNOSIS — E669 Obesity, unspecified: Secondary | ICD-10-CM | POA: Diagnosis present

## 2020-11-02 DIAGNOSIS — Z7902 Long term (current) use of antithrombotics/antiplatelets: Secondary | ICD-10-CM

## 2020-11-02 DIAGNOSIS — J80 Acute respiratory distress syndrome: Secondary | ICD-10-CM | POA: Diagnosis not present

## 2020-11-02 DIAGNOSIS — Z79899 Other long term (current) drug therapy: Secondary | ICD-10-CM

## 2020-11-02 DIAGNOSIS — Z806 Family history of leukemia: Secondary | ICD-10-CM

## 2020-11-02 DIAGNOSIS — Z7982 Long term (current) use of aspirin: Secondary | ICD-10-CM

## 2020-11-02 DIAGNOSIS — F419 Anxiety disorder, unspecified: Secondary | ICD-10-CM | POA: Diagnosis present

## 2020-11-02 DIAGNOSIS — J95851 Ventilator associated pneumonia: Secondary | ICD-10-CM | POA: Diagnosis not present

## 2020-11-02 DIAGNOSIS — R040 Epistaxis: Secondary | ICD-10-CM | POA: Diagnosis not present

## 2020-11-02 DIAGNOSIS — I442 Atrioventricular block, complete: Secondary | ICD-10-CM | POA: Diagnosis present

## 2020-11-02 DIAGNOSIS — E860 Dehydration: Secondary | ICD-10-CM | POA: Diagnosis present

## 2020-11-02 DIAGNOSIS — I469 Cardiac arrest, cause unspecified: Secondary | ICD-10-CM | POA: Diagnosis not present

## 2020-11-02 DIAGNOSIS — Z9911 Dependence on respirator [ventilator] status: Secondary | ICD-10-CM | POA: Diagnosis not present

## 2020-11-02 DIAGNOSIS — R338 Other retention of urine: Secondary | ICD-10-CM | POA: Diagnosis present

## 2020-11-02 DIAGNOSIS — Z888 Allergy status to other drugs, medicaments and biological substances status: Secondary | ICD-10-CM

## 2020-11-02 DIAGNOSIS — R000 Tachycardia, unspecified: Secondary | ICD-10-CM | POA: Diagnosis not present

## 2020-11-02 DIAGNOSIS — E785 Hyperlipidemia, unspecified: Secondary | ICD-10-CM | POA: Diagnosis present

## 2020-11-02 DIAGNOSIS — K567 Ileus, unspecified: Secondary | ICD-10-CM

## 2020-11-02 DIAGNOSIS — J9600 Acute respiratory failure, unspecified whether with hypoxia or hypercapnia: Secondary | ICD-10-CM

## 2020-11-02 DIAGNOSIS — R5381 Other malaise: Secondary | ICD-10-CM | POA: Diagnosis present

## 2020-11-02 DIAGNOSIS — N289 Disorder of kidney and ureter, unspecified: Secondary | ICD-10-CM

## 2020-11-02 DIAGNOSIS — Z283 Underimmunization status: Secondary | ICD-10-CM

## 2020-11-02 DIAGNOSIS — R739 Hyperglycemia, unspecified: Secondary | ICD-10-CM | POA: Diagnosis not present

## 2020-11-02 DIAGNOSIS — I824Z2 Acute embolism and thrombosis of unspecified deep veins of left distal lower extremity: Secondary | ICD-10-CM | POA: Diagnosis not present

## 2020-11-02 DIAGNOSIS — J9601 Acute respiratory failure with hypoxia: Secondary | ICD-10-CM | POA: Diagnosis not present

## 2020-11-02 DIAGNOSIS — Z9289 Personal history of other medical treatment: Secondary | ICD-10-CM

## 2020-11-02 HISTORY — DX: COVID-19: U07.1

## 2020-11-02 LAB — CBC WITH DIFFERENTIAL/PLATELET
Abs Immature Granulocytes: 0.01 10*3/uL (ref 0.00–0.07)
Abs Immature Granulocytes: 0.01 10*3/uL (ref 0.00–0.07)
Basophils Absolute: 0 10*3/uL (ref 0.0–0.1)
Basophils Absolute: 0 10*3/uL (ref 0.0–0.1)
Basophils Relative: 0 %
Basophils Relative: 0 %
Eosinophils Absolute: 0 10*3/uL (ref 0.0–0.5)
Eosinophils Absolute: 0 10*3/uL (ref 0.0–0.5)
Eosinophils Relative: 0 %
Eosinophils Relative: 0 %
HCT: 43.3 % (ref 39.0–52.0)
HCT: 44.2 % (ref 39.0–52.0)
Hemoglobin: 14.2 g/dL (ref 13.0–17.0)
Hemoglobin: 14.1 g/dL (ref 13.0–17.0)
Immature Granulocytes: 0 %
Immature Granulocytes: 0 %
Lymphocytes Relative: 7 %
Lymphocytes Relative: 16 %
Lymphs Abs: 0.4 10*3/uL — ABNORMAL LOW (ref 0.7–4.0)
Lymphs Abs: 0.8 10*3/uL (ref 0.7–4.0)
MCH: 28.6 pg (ref 26.0–34.0)
MCH: 28.4 pg (ref 26.0–34.0)
MCHC: 32.8 g/dL (ref 30.0–36.0)
MCHC: 31.9 g/dL (ref 30.0–36.0)
MCV: 87.1 fL (ref 80.0–100.0)
MCV: 88.9 fL (ref 80.0–100.0)
Monocytes Absolute: 0.4 10*3/uL (ref 0.1–1.0)
Monocytes Absolute: 0.5 10*3/uL (ref 0.1–1.0)
Monocytes Relative: 7 %
Monocytes Relative: 10 %
Neutro Abs: 4.5 10*3/uL (ref 1.7–7.7)
Neutro Abs: 3.6 10*3/uL (ref 1.7–7.7)
Neutrophils Relative %: 86 %
Neutrophils Relative %: 74 %
Platelets: 152 10*3/uL (ref 150–400)
Platelets: 143 10*3/uL — ABNORMAL LOW (ref 150–400)
RBC: 4.97 MIL/uL (ref 4.22–5.81)
RBC: 4.97 MIL/uL (ref 4.22–5.81)
RDW: 14.1 % (ref 11.5–15.5)
RDW: 14.2 % (ref 11.5–15.5)
WBC: 5.3 10*3/uL (ref 4.0–10.5)
WBC: 4.9 10*3/uL (ref 4.0–10.5)
nRBC: 0 % (ref 0.0–0.2)
nRBC: 0 % (ref 0.0–0.2)

## 2020-11-02 LAB — COMPREHENSIVE METABOLIC PANEL
ALT: 65 U/L — ABNORMAL HIGH (ref 0–44)
ALT: 65 U/L — ABNORMAL HIGH (ref 0–44)
AST: 84 U/L — ABNORMAL HIGH (ref 15–41)
AST: 83 U/L — ABNORMAL HIGH (ref 15–41)
Albumin: 3.4 g/dL — ABNORMAL LOW (ref 3.5–5.0)
Albumin: 3.3 g/dL — ABNORMAL LOW (ref 3.5–5.0)
Alkaline Phosphatase: 50 U/L (ref 38–126)
Alkaline Phosphatase: 48 U/L (ref 38–126)
Anion gap: 12 (ref 5–15)
Anion gap: 13 (ref 5–15)
BUN: 15 mg/dL (ref 8–23)
BUN: 15 mg/dL (ref 8–23)
CO2: 21 mmol/L — ABNORMAL LOW (ref 22–32)
CO2: 23 mmol/L (ref 22–32)
Calcium: 8.5 mg/dL — ABNORMAL LOW (ref 8.9–10.3)
Calcium: 8.5 mg/dL — ABNORMAL LOW (ref 8.9–10.3)
Chloride: 104 mmol/L (ref 98–111)
Chloride: 105 mmol/L (ref 98–111)
Creatinine, Ser: 1.31 mg/dL — ABNORMAL HIGH (ref 0.61–1.24)
Creatinine, Ser: 1.29 mg/dL — ABNORMAL HIGH (ref 0.61–1.24)
GFR, Estimated: >60 mL/min (ref >60)
GFR, Estimated: >60 mL/min (ref >60)
Glucose, Bld: 125 mg/dL — ABNORMAL HIGH (ref 70–99)
Glucose, Bld: 105 mg/dL — ABNORMAL HIGH (ref 70–99)
Potassium: 4 mmol/L (ref 3.5–5.1)
Potassium: 4 mmol/L (ref 3.5–5.1)
Sodium: 137 mmol/L (ref 135–145)
Sodium: 141 mmol/L (ref 135–145)
Total Bilirubin: 0.5 mg/dL (ref 0.3–1.2)
Total Bilirubin: 0.5 mg/dL (ref 0.3–1.2)
Total Protein: 6.2 g/dL — ABNORMAL LOW (ref 6.5–8.1)
Total Protein: 6.1 g/dL — ABNORMAL LOW (ref 6.5–8.1)

## 2020-11-02 LAB — PROCALCITONIN: Procalcitonin: <0.1 ng/mL

## 2020-11-02 LAB — HIV ANTIBODY (ROUTINE TESTING W REFLEX): HIV Screen 4th Generation wRfx: NONREACTIVE

## 2020-11-02 LAB — CBG MONITORING, ED
Glucose-Capillary: 93 mg/dL (ref 70–99)
Glucose-Capillary: 108 mg/dL — ABNORMAL HIGH (ref 70–99)

## 2020-11-02 LAB — TYPE AND SCREEN
ABO/RH(D): A POS
Antibody Screen: NEGATIVE

## 2020-11-02 LAB — RESP PANEL BY RT-PCR (FLU A&B, COVID) ARPGX2
Influenza A by PCR: NEGATIVE
Influenza B by PCR: NEGATIVE
SARS Coronavirus 2 by RT PCR: POSITIVE — AB

## 2020-11-02 LAB — AMMONIA: Ammonia: 33 umol/L (ref 9–35)

## 2020-11-02 LAB — C-REACTIVE PROTEIN: CRP: 3.7 mg/dL — ABNORMAL HIGH (ref <1.0)

## 2020-11-02 LAB — D-DIMER, QUANTITATIVE: D-Dimer, Quant: 1.2 ug/mL-FEU — ABNORMAL HIGH (ref 0.00–0.50)

## 2020-11-02 LAB — FIBRINOGEN: Fibrinogen: 391 mg/dL (ref 210–475)

## 2020-11-02 LAB — HEPATITIS B SURFACE ANTIGEN: Hepatitis B Surface Ag: NONREACTIVE

## 2020-11-02 LAB — ABO/RH: ABO/RH(D): A POS

## 2020-11-02 LAB — FERRITIN: Ferritin: 326 ng/mL (ref 24–336)

## 2020-11-02 LAB — TSH: TSH: 4.066 u[IU]/mL (ref 0.350–4.500)

## 2020-11-02 LAB — VITAMIN B12: Vitamin B-12: 970 pg/mL — ABNORMAL HIGH (ref 180–914)

## 2020-11-02 LAB — LACTATE DEHYDROGENASE: LDH: 386 U/L — ABNORMAL HIGH (ref 98–192)

## 2020-11-02 MED ORDER — BUSPIRONE HCL 5 MG PO TABS
15.0000 mg | ORAL_TABLET | Freq: Every day | ORAL | Status: DC
  Administered 2020-11-02 – 2020-11-04 (×2): 15 mg via ORAL
  Filled 2020-11-02: qty 2
  Filled 2020-11-02: qty 3
  Filled 2020-11-02: qty 2

## 2020-11-02 MED ORDER — ALBUTEROL SULFATE HFA 108 (90 BASE) MCG/ACT IN AERS: 2.0000 | INHALATION_SPRAY | Freq: Once | RESPIRATORY_TRACT | Status: DC | PRN

## 2020-11-02 MED ORDER — ENOXAPARIN SODIUM 40 MG/0.4ML ~~LOC~~ SOLN
40.0000 mg | Freq: Every day | SUBCUTANEOUS | Status: DC
  Administered 2020-11-02 – 2020-11-08 (×7): 40 mg via SUBCUTANEOUS
  Filled 2020-11-02 (×7): qty 0.4

## 2020-11-02 MED ORDER — CLOPIDOGREL BISULFATE 75 MG PO TABS
75.0000 mg | ORAL_TABLET | Freq: Every day | ORAL | Status: DC
  Administered 2020-11-02 – 2020-11-04 (×2): 75 mg via ORAL
  Filled 2020-11-02 (×3): qty 1

## 2020-11-02 MED ORDER — BENZTROPINE MESYLATE 1 MG PO TABS: 1.0000 mg | ORAL_TABLET | ORAL | Status: DC

## 2020-11-02 MED ORDER — EPINEPHRINE 0.3 MG/0.3ML IJ SOAJ: 0.3000 mg | Freq: Once | INTRAMUSCULAR | Status: DC | PRN

## 2020-11-02 MED ORDER — FAMOTIDINE IN NACL 20-0.9 MG/50ML-% IV SOLN: 20.0000 mg | Freq: Once | INTRAVENOUS | Status: DC | PRN

## 2020-11-02 MED ORDER — TAMSULOSIN HCL 0.4 MG PO CAPS
0.4000 mg | ORAL_CAPSULE | Freq: Every day | ORAL | Status: DC
  Administered 2020-11-02 – 2020-11-04 (×2): 0.4 mg via ORAL
  Filled 2020-11-02 (×3): qty 1

## 2020-11-02 MED ORDER — ASPIRIN EC 81 MG PO TBEC
81.0000 mg | DELAYED_RELEASE_TABLET | Freq: Every day | ORAL | Status: DC
  Administered 2020-11-02 – 2020-11-04 (×2): 81 mg via ORAL
  Filled 2020-11-02 (×3): qty 1

## 2020-11-02 MED ORDER — ATORVASTATIN CALCIUM 40 MG PO TABS
40.0000 mg | ORAL_TABLET | Freq: Every day | ORAL | Status: DC
  Administered 2020-11-02 – 2020-11-04 (×2): 40 mg via ORAL
  Filled 2020-11-02 (×2): qty 1

## 2020-11-02 MED ORDER — RISPERIDONE 0.5 MG PO TABS
1.0000 mg | ORAL_TABLET | Freq: Three times a day (TID) | ORAL | Status: DC
  Administered 2020-11-02 – 2020-11-04 (×5): 1 mg via ORAL
  Filled 2020-11-02: qty 2
  Filled 2020-11-02 (×9): qty 1

## 2020-11-02 MED ORDER — ALPRAZOLAM 0.25 MG PO TABS: 1.0000 mg | ORAL_TABLET | Freq: Two times a day (BID) | ORAL | Status: DC | PRN

## 2020-11-02 MED ORDER — BENZTROPINE MESYLATE 1 MG PO TABS
1.0000 mg | ORAL_TABLET | Freq: Every day | ORAL | Status: DC
  Administered 2020-11-02 – 2020-11-04 (×3): 1 mg via ORAL
  Filled 2020-11-02 (×4): qty 1

## 2020-11-02 MED ORDER — PANTOPRAZOLE SODIUM 40 MG PO TBEC
40.0000 mg | DELAYED_RELEASE_TABLET | Freq: Every day | ORAL | Status: DC
  Administered 2020-11-02 – 2020-11-04 (×2): 40 mg via ORAL
  Filled 2020-11-02 (×3): qty 1

## 2020-11-02 MED ORDER — ACETAMINOPHEN 325 MG PO TABS
650.0000 mg | ORAL_TABLET | Freq: Four times a day (QID) | ORAL | Status: DC | PRN
  Administered 2020-11-05: 650 mg via ORAL
  Filled 2020-11-02 (×2): qty 2

## 2020-11-02 MED ORDER — DIPHENHYDRAMINE HCL 50 MG/ML IJ SOLN: 50.0000 mg | Freq: Once | INTRAMUSCULAR | Status: DC | PRN

## 2020-11-02 MED ORDER — ACETAMINOPHEN 650 MG RE SUPP
650.0000 mg | Freq: Four times a day (QID) | RECTAL | Status: DC | PRN
  Administered 2020-11-04 – 2020-11-05 (×2): 650 mg via RECTAL
  Filled 2020-11-02 (×2): qty 1

## 2020-11-02 MED ORDER — SODIUM CHLORIDE 0.9 % IV SOLN: INTRAVENOUS | Status: DC | PRN

## 2020-11-02 MED ORDER — SODIUM CHLORIDE 0.9 % IV SOLN
Freq: Once | INTRAVENOUS | Status: AC
  Filled 2020-11-02: qty 20

## 2020-11-02 MED ORDER — BENZTROPINE MESYLATE 2 MG PO TABS
2.0000 mg | ORAL_TABLET | Freq: Every day | ORAL | Status: DC
  Administered 2020-11-02 – 2020-11-04 (×3): 2 mg via ORAL
  Filled 2020-11-02 (×3): qty 2

## 2020-11-02 MED ORDER — LORAZEPAM 2 MG/ML IJ SOLN
1.0000 mg | Freq: Four times a day (QID) | INTRAMUSCULAR | Status: DC | PRN
  Administered 2020-11-02 – 2020-11-03 (×3): 1 mg via INTRAMUSCULAR
  Filled 2020-11-02 (×3): qty 1

## 2020-11-02 MED ORDER — CITALOPRAM HYDROBROMIDE 40 MG PO TABS
40.0000 mg | ORAL_TABLET | Freq: Every day | ORAL | Status: DC
  Administered 2020-11-02 – 2020-11-04 (×2): 40 mg via ORAL
  Filled 2020-11-02: qty 1
  Filled 2020-11-02: qty 4

## 2020-11-02 MED ORDER — BUPROPION HCL ER (XL) 150 MG PO TB24
300.0000 mg | ORAL_TABLET | Freq: Every day | ORAL | Status: DC
  Administered 2020-11-02 – 2020-11-04 (×2): 300 mg via ORAL
  Filled 2020-11-02 (×3): qty 2

## 2020-11-02 MED ORDER — SODIUM CHLORIDE 0.9 % IV SOLN: INTRAVENOUS | Status: AC

## 2020-11-02 MED ORDER — METHYLPREDNISOLONE SODIUM SUCC 125 MG IJ SOLR: 125.0000 mg | Freq: Once | INTRAMUSCULAR | Status: DC | PRN

## 2020-11-02 NOTE — ED Notes (Signed)
Patient transported to CT 

## 2020-11-02 NOTE — Evaluation (Signed)
Clinical/Bedside Swallow Evaluation Patient Details  Name: Billy Simon MRN: [id number] Date of Birth: 1956/08/20  Today's Date: 11/17/2020 Time: SLP Start Time (ACUTE ONLY): 1350 SLP Stop Time (ACUTE ONLY): 1358 SLP Time Calculation (min) (ACUTE ONLY): 8 min  Past Medical History:  Past Medical History:  Diagnosis Date  . CHB (complete heart block) (HCC) 09/14/2018  . COVID-19   . Syncope 08/2018   Hospitalized at [facility]   Past Surgical History:  Past Surgical History:  Procedure Laterality Date  . LEFT HEART CATH AND CORONARY ANGIOGRAPHY N/A 09/14/2018   Procedure: LEFT HEART CATH AND CORONARY ANGIOGRAPHY;  Surgeon: [place], Peter M, MD;  Location: [facility] INVASIVE CV LAB;  Service: Cardiovascular;  Laterality: N/A;  . PACEMAKER IMPLANT N/A 09/16/2018   Procedure: PACEMAKER IMPLANT;  Surgeon: Regan Lemming, MD;  Location: [facility] INVASIVE CV LAB;  Service: Cardiovascular;  Laterality: N/A;  . TEMPORARY PACEMAKER N/A 09/14/2018   Procedure: TEMPORARY PACEMAKER;  Surgeon: [place], Peter M, MD;  Location: [facility] INVASIVE CV LAB;  Service: Cardiovascular;  Laterality: N/A;   HPI:  Billy Simon is a 64 y.o. male with history of schizoaffective disorder, complete heart block status post pacemaker placement who was recently diagnosed with COVID-19 infection on October 31, 2020 was found to be increasingly confused since last evening at home.  Progressively patient found difficult to ambulate also.  Did not have any fall.  As per the patient's wife patient started having fever and weakness about 5 days ago and on December 1 noticed 3 days ago patient was tested for Covid which came positive.  And yesterday that is the following day patient became more weak and confused and not able to ambulate and at this point patient was brought to the ER.    Assessment / Plan / Recommendation Clinical Impression  Pt demonstrates normal swallowing ability. No diet modifcation or SLP f/u  needed will sign off.       Aspiration Risk       Diet Recommendation Regular;Thin liquid        Other  Recommendations     Follow up Recommendations        Frequency and Duration            Prognosis        Swallow Study   General HPI: Billy Simon is a 64 y.o. male with history of schizoaffective disorder, complete heart block status post pacemaker placement who was recently diagnosed with COVID-19 infection on October 31, 2020 was found to be increasingly confused since last evening at home.  Progressively patient found difficult to ambulate also.  Did not have any fall.  As per the patient's wife patient started having fever and weakness about 5 days ago and on December 1 noticed 3 days ago patient was tested for Covid which came positive.  And yesterday that is the following day patient became more weak and confused and not able to ambulate and at this point patient was brought to the ER.  Type of Study: Bedside Swallow Evaluation Previous Swallow Assessment: none Diet Prior to this Study: Regular;Thin liquids Temperature Spikes Noted: No Respiratory Status: Nasal cannula History of Recent Intubation: No Behavior/Cognition: Alert;Distractible;Requires cueing Oral Cavity Assessment: Within Functional Limits Oral Care Completed by SLP: No Oral Cavity - Dentition: Adequate natural dentition Vision: Functional for self-feeding Self-Feeding Abilities: Able to feed self Patient Positioning: Upright in bed Baseline Vocal Quality: Normal Volitional Cough: Strong Volitional Swallow: Able to elicit  Oral/Motor/Sensory Function Overall Oral Motor/Sensory Function: Within functional limits   Ice Chips     Thin Liquid Thin Liquid: Within functional limits    Nectar Thick Nectar Thick Liquid: Not tested   Honey Thick Honey Thick Liquid: Not tested   Puree Puree: Not tested   Solid     Solid: Within functional limits      Bogdan Vivona, Riley Nearing 11/08/2020,2:22  PM

## 2020-11-02 NOTE — ED Notes (Signed)
This nurse witnessed pt pulling out IV and leads. MD Shanker paged.

## 2020-11-02 NOTE — Progress Notes (Signed)
Per nurse, patient too agitated and combative for EEG at this time. He has already pulled out 3 IVs. Will attempt tomorrow as schedule permits.

## 2020-11-02 NOTE — ED Notes (Addendum)
Patient removed second peripheral IV line. RN Tammy Sours started new line IV, restarted IV fluids. Bedside swallow screen performed and documented, patient able to tolerate oral fluids well. Patient alert and speaking in full sentences. Crackers and gingerale provided with permission from MD, diet order placed. IV line reinforced with gauze wrap, soft mittens applied. Patient calm at this time.

## 2020-11-02 NOTE — ED Notes (Addendum)
1mg  IM Ativan given. Patient reconnected to telemetry, BP cuff, and pulse oximeter. Maintenance fluids not restarted due to lack of IV access. Will attempt to start new IV once patient is calm and no longer pulling at lines. MD aware.

## 2020-11-02 NOTE — ED Notes (Addendum)
Peripheral IV found on floor in patient's room by this RN during hourly rounding. Patient's wife states she does not know when patient removed IV from arm. New peripheral IV site obtained, reinforced with coban. Maintenance fluids restarted on new IV site. Patient pulling at pulse oximeter and BP cuff. Dimmed lights in room, turned on television for distraction. Safety mittens ordered by Diplomatic Services operational officer.

## 2020-11-02 NOTE — ED Provider Notes (Signed)
[facility] EMERGENCY DEPARTMENT Provider Note   CSN: [id number] Arrival date & time: 11/21/2020  0221    History Chief Complaint  Patient presents with   Covid+ / Confused w/ fever    Billy Simon is a 64 y.o. male.  The history is provided by a relative. The history is limited by the condition of the patient (Altered mental status).  He has history of complete heart block with permanent pacemaker, schizoaffective disorder and is brought in by his wife because of confusion.  He started running low-grade fevers with cough 4 days ago, and tested positive for COVID-19 2 days ago.  Yesterday, he became nonverbal.  He will still follow commands.  There has been no known vomiting or diarrhea.  Past Medical History:  Diagnosis Date   CHB (complete heart block) (HCC) 09/14/2018   COVID-19    Syncope 08/2018   Hospitalized at [facility]    Patient Active Problem List   Diagnosis Date Noted   Debility    Schizoaffective disorder, depressive type (HCC)    Dysphagia    Leukocytosis    Delirium due to another medical condition    Pressure injury of skin 09/27/2018   Encephalopathy acute    CHB (complete heart block) (HCC) 09/14/2018   Complete heart block (HCC) 09/14/2018   Vasovagal syncope 05/20/2018   Schizoaffective disorder (HCC) 03/28/2014    Past Surgical History:  Procedure Laterality Date   LEFT HEART CATH AND CORONARY ANGIOGRAPHY N/A 09/14/2018   Procedure: LEFT HEART CATH AND CORONARY ANGIOGRAPHY;  Surgeon: [place], Peter M, MD;  Location: [facility] INVASIVE CV LAB;  Service: Cardiovascular;  Laterality: N/A;   PACEMAKER IMPLANT N/A 09/16/2018   Procedure: PACEMAKER IMPLANT;  Surgeon: Regan Lemming, MD;  Location: [facility] INVASIVE CV LAB;  Service: Cardiovascular;  Laterality: N/A;   TEMPORARY PACEMAKER N/A 09/14/2018   Procedure: TEMPORARY PACEMAKER;  Surgeon: [place], Peter M, MD;  Location: [facility] INVASIVE CV LAB;  Service:  Cardiovascular;  Laterality: N/A;       Family History  Problem Relation Age of Onset   Leukemia Mother    Heart attack Father    Heart Problems Father        pacemaker    Social History   Tobacco Use   Smoking status: Never Smoker   Smokeless tobacco: Never Used  Substance Use Topics   Alcohol use: Never   Drug use: Not on file    Home Medications Prior to Admission medications   Medication Sig Start Date End Date Taking? Authorizing Provider  ALPRAZolam Prudy Feeler) 0.5 MG tablet Take 1 tablet (0.5 mg total) by mouth 2 (two) times daily as needed for anxiety. 10/11/18  Yes Barnetta Chapel, MD  ALPRAZolam Prudy Feeler) 1 MG tablet Take 1 mg by mouth 2 (two) times daily as needed. 05/07/20  Yes [provider]  ascorbic acid (VITAMIN C) 500 MG tablet Take 500 mg by mouth 2 (two) times daily.   Yes [provider]  aspirin 81 MG EC tablet Take 81 mg by mouth daily.    Yes [provider]  atorvastatin (LIPITOR) 40 MG tablet Take 40 mg by mouth at bedtime. 09/06/20  Yes [provider]  benztropine (COGENTIN) 1 MG tablet Take 1 mg by mouth See admin instructions. 2 tabs in the morning 1 tab hs   Yes [provider]  buPROPion (WELLBUTRIN XL) 300 MG 24 hr tablet Take 300 mg by mouth daily.   Yes [provider]  busPIRone (BUSPAR) 15 MG tablet Take 1 tablet (15 mg total) by mouth daily. 10/12/18  Yes Berton Mountgbata, Sylvester I, MD  Cholecalciferol (VITAMIN D3) 50 MCG (2000 UT) CAPS Take 1 capsule by mouth daily.   Yes [provider]  citalopram (CELEXA) 40 MG tablet Take 40 mg by mouth daily.   Yes [provider]  clopidogrel (PLAVIX) 75 MG tablet Take 75 mg by mouth daily. 09/30/20  Yes [provider]  meclizine (ANTIVERT) 25 MG tablet Take 25 mg by mouth 3 (three) times daily as needed for dizziness.   Yes [provider]  pantoprazole (PROTONIX) 40 MG tablet Take 40 mg by mouth daily. 05/08/19  Yes  [provider]  risperiDONE (RISPERDAL) 1 MG tablet Take 1 mg by mouth 3 (three) times daily.  10/26/20  Yes [provider]  tamsulosin (FLOMAX) 0.4 MG CAPS capsule Take 0.4 mg by mouth daily. 10/11/20  Yes [provider]  Zinc 100 MG TABS Take 1 tablet by mouth daily.   Yes [provider]  risperiDONE (RISPERDAL) 1 MG/ML oral solution Place 2 mLs (2 mg total) into feeding tube 3 (three) times daily. Patient not taking: Reported on 11/16/2020 10/11/18   Barnetta Chapelgbata, Sylvester I, MD    Allergies    Duragesic-100 [fentanyl]  Review of Systems   Review of Systems  Unable to perform ROS: Mental status change    Physical Exam Updated Vital Signs BP 125/70    Pulse 70    Temp 99 F (37.2 C) (Oral)    Resp (!) 27    Ht 5\' 9"  (1.753 m)    Wt 105 kg    SpO2 94%    BMI 34.18 kg/m   Physical Exam Vitals and nursing note reviewed.   64 year old male, resting comfortably and in no acute distress. Vital signs are significant for elevated respiratory rate. Oxygen saturation is 94%, which is normal. Head is normocephalic and atraumatic. PERRLA, EOMI. Oropharynx is clear. Neck is nontender and supple without adenopathy or JVD. Back is nontender and there is no CVA tenderness. Lungs have bibasilar rales without wheezes or rhonchi. Chest is nontender. Heart has regular rate and rhythm without murmur. Abdomen is soft, flat, nontender without masses or hepatosplenomegaly and peristalsis is normoactive. Extremities have 1+ edema, full range of motion is present. Skin is warm and dry without rash. Neurologic: Awake but nonverbal, will follow commands, cranial nerves are intact, moves all extremities equally.  ED Results / Procedures / Treatments   Labs (all labs ordered are listed, but only abnormal results are displayed) Labs Reviewed  CBC WITH DIFFERENTIAL/PLATELET - Abnormal; Notable for the following components:      Result Value   Lymphs Abs 0.4 (*)    All  other components within normal limits  COMPREHENSIVE METABOLIC PANEL - Abnormal; Notable for the following components:   CO2 21 (*)    Glucose, Bld 125 (*)    Creatinine, Ser 1.31 (*)    Calcium 8.5 (*)    Total Protein 6.2 (*)    Albumin 3.4 (*)    AST 84 (*)    ALT 65 (*)    All other components within normal limits  D-DIMER, QUANTITATIVE (NOT AT [facility]) - Abnormal; Notable for the following components:   D-Dimer, Quant 1.20 (*)    All other components within normal limits  LACTATE DEHYDROGENASE - Abnormal; Notable for the following components:   LDH 386 (*)    All other  components within normal limits  C-REACTIVE PROTEIN - Abnormal; Notable for the following components:   CRP 3.7 (*)    All other components within normal limits  RESP PANEL BY RT-PCR (FLU A&B, COVID) ARPGX2  FIBRINOGEN  FERRITIN  PROCALCITONIN  HEPATITIS B SURFACE ANTIGEN  HIV ANTIBODY (ROUTINE TESTING W REFLEX)  CBG MONITORING, ED  TYPE AND SCREEN  ABO/RH    EKG EKG Interpretation  Date/Time:  Friday 11-17-20 04:02:05 EST Ventricular Rate:  70 PR Interval:    QRS Duration: 158 QT Interval:  472 QTC Calculation: 510 R Axis:   -92 Text Interpretation: Atrial-sensed ventricular-paced rhythm No old tracing to compare Confirmed by Dione Booze ([id number]) on 2020-11-17 4:22:46 AM   Radiology DG Chest Portable 1 View  Result Date: 17-Nov-2020 CLINICAL DATA:  COVID-19 positivity with fevers EXAM: PORTABLE CHEST 1 VIEW COMPARISON:  09/15/2019 FINDINGS: Cardiac shadow is stable. Pacing device is again seen. Patchy opacity is noted in the left base consistent with the given clinical history. No sizable effusion is noted. No bony abnormality is seen. IMPRESSION: Patchy airspace opacity in the left base consistent with the given clinical history of COVID-19 positivity. Electronically Signed   By: Alcide Clever M.D.   On: 2020/11/17 03:01    Procedures Procedures   Medications Ordered in ED Medications   aspirin EC tablet 81 mg (has no administration in time range)  atorvastatin (LIPITOR) tablet 40 mg (has no administration in time range)  ALPRAZolam (XANAX) tablet 1 mg (has no administration in time range)  buPROPion (WELLBUTRIN XL) 24 hr tablet 300 mg (has no administration in time range)  busPIRone (BUSPAR) tablet 15 mg (has no administration in time range)  citalopram (CELEXA) tablet 40 mg (has no administration in time range)  risperiDONE (RISPERDAL) tablet 1 mg (has no administration in time range)  pantoprazole (PROTONIX) EC tablet 40 mg (has no administration in time range)  tamsulosin (FLOMAX) capsule 0.4 mg (has no administration in time range)  clopidogrel (PLAVIX) tablet 75 mg (has no administration in time range)  0.9 %  sodium chloride infusion (has no administration in time range)  acetaminophen (TYLENOL) tablet 650 mg (has no administration in time range)    Or  acetaminophen (TYLENOL) suppository 650 mg (has no administration in time range)  benztropine (COGENTIN) tablet 2 mg (has no administration in time range)    And  benztropine (COGENTIN) tablet 1 mg (has no administration in time range)    ED Course  I have reviewed the triage vital signs and the nursing notes.  Pertinent labs & imaging results that were available during my care of the patient were reviewed by me and considered in my medical decision making (see chart for details).  MDM Rules/Calculators/A&P Altered mental status in the setting of COVID-19 infection.  Although infiltrates are seen on chest x-ray, he is not demonstrating any hypoxia.  Labs show mild renal insufficiency in a range that he has been in the past, mild elevation of transaminases which is actually improved over most recent value on record in 2019.  He is clearly at risk for worsening of respiratory status.  Case is discussed with Dr. Toniann Fail of [facility], who agrees to admit the patient.  Adien Kimari Coudriet was evaluated in  Emergency Department on 11/17/20 for the symptoms described in the history of present illness. He was evaluated in the context of the global COVID-19 pandemic, which necessitated consideration that the patient might be at risk for infection with the  SARS-CoV-2 virus that causes COVID-19. Institutional protocols and algorithms that pertain to the evaluation of patients at risk for COVID-19 are in a state of rapid change based on information released by regulatory bodies including the CDC and federal and state organizations. These policies and algorithms were followed during the patient's care in the ED.   Final Clinical Impression(s) / ED Diagnoses Final diagnoses:  Altered mental status, unspecified altered mental status type  COVID-19 virus infection  Renal insufficiency  Elevated transaminase level    Rx / DC Orders ED Discharge Orders    None       Dione Booze, MD 11/21/2020 [phone]

## 2020-11-02 NOTE — Progress Notes (Signed)
PROGRESS NOTE                                                                                                                                                                                                             Patient Demographics:    Billy Simon, is a 64 y.o. male, DOB - 07/05/1956, AGT:[id number]  Outpatient Primary MD for the patient is Billy Proffer, MD   Admit date - 11/14/2020   LOS - 0  Chief Complaint  Patient presents with  . Covid+ / Confused w/ fever       Brief Narrative: Patient is a 65 y.o. male with PMHx of schizoaffective disorder, complete heart block s/p pacemaker placement-started having cough/fever since 11/29-subsequently diagnosed with COVID-19 on 12/1-brought to the hospital with a 1 day history of worsening confusion.  Patient subsequently admitted to the hospitalist service for further evaluation and treatment.  COVID-19 vaccinated status: Unvaccinated  Significant Events: 12/3>> Admit to [facility] for generalized weakness/fatigue/confusion/fever-COVID-19 positive  Significant studies: 12/3>>Chest x-ray: Patchy airspace opacity in the left base 12/3>> CT head: No acute intracranial abnormality.  COVID-19 medications: Monoclonal antibody infusion: 12/3 x 1  Antibiotics: None  Microbiology data: None  Procedures: None  Consults: None  DVT prophylaxis: SCDs Start: 11/28/2020 0536 Prophylactic Lovenox    Subjective:    Billy Simon today was sleeping when I first walked into the room-his wife was at bedside.  Per spouse-he has been exhausted-and has not slept-finally got some sleep around 5 AM.  When I woke him up-he was able to follow my commands-nodding head yes and no-and answering in a few word sentences.  Per spouse-he appears clinically improved compared to yesterday.   Assessment  & Plan :   Acute metabolic encephalopathy: Suspect related to COVID-19 infection-Per  spouse-she suspects some mild cognitive dysfunction at baseline.  CT head without acute abnormalities, TSH/ammonia levels are normal, vitamin B12 stable.  Since patient is already somewhat better than yesterday (although not yet back to baseline)-doubt further neuroimaging or work-up required-however I will go ahead and obtain EEG.  Plan is to follow clinical course--if his mentation does not improve-we can consider ordering a MRI brain (per spouse-PPM is MRI compatible)  COVID-19 infection: Not hypoxic-stable on room air-really does not have any major respiratory symptoms-getting monoclonal antibody infusion this morning.  If hypoxemia develops-or if  pneumonia symptoms become more apparent-we will start COVID-19 treatment with a steroid/Remdesivir.  Fever: afebrile O2 requirements:  SpO2: 92 %  COVID-19 Labs: Recent Labs    11/11/2020 0400  DDIMER 1.20*  FERRITIN 326  LDH 386*  CRP 3.7*       Component Value Date/Time   BNP 110.3 (H) 10/02/2018 1054    Recent Labs  Lab 11/26/2020 0400  PROCALCITON <0.10    Lab Results  Component Value Date   SARSCOV2NAA POSITIVE (A) 11/24/2020    AKI: Appears mild-likely hemodynamically mediated-downtrending-follow electrolytes.  Transaminitis: Likely related to COVID-19 related inflammation-appears mild-no further work-up required at this time.  Plan is to follow closely.  Schizoaffective disorder: Continue Risperdal, Celexa, BuSpar, Wellbutrin and benztropine.  History of complete heart block-s/p PPM placement ([employer]-09/16/2018): Continue telemetry monitoring  BPH: Continue Flomax  GERD: Continue PPI  Deconditioning/debility: Due to COVID-19 infection-await PT/OT eval.   Obesity: Estimated body mass index is 34.18 kg/m as calculated from the following:   Height as of this encounter: 5\' 9"  (1.753 m).   Weight as of this encounter: 105 kg.    GI prophylaxis: PPI  ABG:    Component Value Date/Time   PHART 7.507 (H)  09/24/2018 0413   PCO2ART 37.1 09/24/2018 0413   PO2ART 85.0 09/24/2018 0413   HCO3 29.3 (H) 09/24/2018 0413   TCO2 30 09/24/2018 0413   ACIDBASEDEF 3.0 (H) 09/20/2018 0411   O2SAT 97.0 09/24/2018 0413    Vent Settings: N/A   Condition - Extremely Guarded  Family Communication  :  Spouse @ bedside in the ED  Code Status :  Full Code  Diet :  Diet Order            Diet NPO time specified Except for: Sips with Meds  Diet effective now                  Disposition Plan  :   Status is: Inpatient  Remains inpatient appropriate because:Inpatient level of care appropriate due to severity of illness   Dispo: The patient is from: Home              Anticipated d/c is to: Home              Anticipated d/c date is: 2 days              Patient currently is not medically stable to d/c.  Barriers to discharge: Metabolic encephalopathy-needing further inpatient work-up-pending EEG-severe debility/deconditioning-needing PT/OT to determine safe disposition  Antimicorbials  :    Anti-infectives (From admission, onward)   None      Inpatient Medications  Scheduled Meds: . aspirin EC  81 mg Oral Daily  . atorvastatin  40 mg Oral QHS  . benztropine  2 mg Oral Daily   And  . benztropine  1 mg Oral QHS  . buPROPion  300 mg Oral Daily  . busPIRone  15 mg Oral Daily  . citalopram  40 mg Oral Daily  . clopidogrel  75 mg Oral Daily  . pantoprazole  40 mg Oral Daily  . risperiDONE  1 mg Oral TID  . tamsulosin  0.4 mg Oral Daily   Continuous Infusions: . sodium chloride 75 mL/hr at 11/06/2020 0819  . sodium chloride    . famotidine (PEPCID) IV     PRN Meds:.sodium chloride, acetaminophen **OR** acetaminophen, albuterol, ALPRAZolam, diphenhydrAMINE, EPINEPHrine, famotidine (PEPCID) IV, methylPREDNISolone (SOLU-MEDROL) injection   Time Spent in minutes  25  See all Orders from today for further details   Jeoffrey MassedShanker Cinda Hara M.D on 11/17/2020 at 11:05 AM  To page go to  [email or web address] - use universal password  [facility] -  Office  [phone]    Objective:   Vitals:   Oct 30, 2020 0930 Oct 30, 2020 0945 Oct 30, 2020 1000 Oct 30, 2020 1015  BP: 111/73 110/74 98/76 (!) 113/57  Pulse: 63 72 65 65  Resp: 20 17 19  (!) 23  Temp:      TempSrc:      SpO2: 92% 95% 94% 92%  Weight:      Height:        Wt Readings from Last 3 Encounters:  Oct 30, 2020 105 kg  01/02/20 97.3 kg  10/11/18 84.6 kg    No intake or output data in the 24 hours ending Oct 30, 2020 1105   Physical Exam Gen Exam: Comfortable-not any distress-following simple commands. HEENT:atraumatic, normocephalic Chest: B/L clear to auscultation anteriorly CVS:S1S2 regular Abdomen:soft non tender, non distended Extremities:no edema Neurology: Non focal Skin: no rash   Data Review:    CBC Recent Labs  Lab Oct 30, 2020 0234 Oct 30, 2020 0727  WBC 5.3 4.9  HGB 14.2 14.1  HCT 43.3 44.2  PLT 152 143*  MCV 87.1 88.9  MCH 28.6 28.4  MCHC 32.8 31.9  RDW 14.1 14.2  LYMPHSABS 0.4* 0.8  MONOABS 0.4 0.5  EOSABS 0.0 0.0  BASOSABS 0.0 0.0    Chemistries  Recent Labs  Lab Oct 30, 2020 0234 Oct 30, 2020 0400  NA 137 141  K 4.0 4.0  CL 104 105  CO2 21* 23  GLUCOSE 125* 105*  BUN 15 15  CREATININE 1.31* 1.29*  CALCIUM 8.5* 8.5*  AST 84* 83*  ALT 65* 65*  ALKPHOS 50 48  BILITOT 0.5 0.5   ------------------------------------------------------------------------------------------------------------------ No results for input(s): CHOL, HDL, LDLCALC, TRIG, CHOLHDL, LDLDIRECT in the last 72 hours.  No results found for: HGBA1C ------------------------------------------------------------------------------------------------------------------ Recent Labs    Oct 30, 2020 0400  TSH 4.066   ------------------------------------------------------------------------------------------------------------------ Recent Labs    Oct 30, 2020 0400  VITAMINB12 970*  FERRITIN 326    Coagulation profile No results for  input(s): INR, PROTIME in the last 168 hours.  Recent Labs    Oct 30, 2020 0400  DDIMER 1.20*    Cardiac Enzymes No results for input(s): CKMB, TROPONINI, MYOGLOBIN in the last 168 hours.  Invalid input(s): CK ------------------------------------------------------------------------------------------------------------------    Component Value Date/Time   BNP 110.3 (H) 10/02/2018 1054    Micro Results Recent Results (from the past 240 hour(s))  Resp Panel by RT-PCR (Flu A&B, Covid) Nasopharyngeal Swab     Status: Abnormal   Collection Time: Oct 30, 2020  4:29 AM   Specimen: Nasopharyngeal Swab; Nasopharyngeal(NP) swabs in vial transport medium  Result Value Ref Range Status   SARS Coronavirus 2 by RT PCR POSITIVE (A) NEGATIVE Final    Comment: RESULT CALLED TO, READ BACK BY AND VERIFIED WITH: A OAKLEY RN 11/17/2020 [id number] JDW (NOTE) SARS-CoV-2 target nucleic acids are DETECTED.  The SARS-CoV-2 RNA is generally detectable in upper respiratory specimens during the acute phase of infection. Positive results are indicative of the presence of the identified virus, but do not rule out bacterial infection or co-infection with other pathogens not detected by the test. Clinical correlation with patient history and other diagnostic information is necessary to determine patient infection status. The expected result is Negative.  Fact Sheet for Patients: BloggerCourse.comhttps://www.fda.gov/media/152166/download  Fact Sheet for Healthcare Providers: SeriousBroker.ithttps://www.fda.gov/media/152162/download  This test is not yet approved or cleared by the [place] FDA and  has  been authorized for detection and/or diagnosis of SARS-CoV-2 by FDA under an Emergency Use Authorization (EUA).  This EUA will remain in effect (meaning this test can be used ) for the duration of  the COVID-19 declaration under Section 564(b)(1) of the Act, 21 U.S.C. section 360bbb-3(b)(1), unless the authorization is terminated or revoked  sooner.     Influenza A by PCR NEGATIVE NEGATIVE Final   Influenza B by PCR NEGATIVE NEGATIVE Final    Comment: (NOTE) The Xpert Xpress SARS-CoV-2/FLU/RSV plus assay is intended as an aid in the diagnosis of influenza from Nasopharyngeal swab specimens and should not be used as a sole basis for treatment. Nasal washings and aspirates are unacceptable for Xpert Xpress SARS-CoV-2/FLU/RSV testing.  Fact Sheet for Patients: BloggerCourse.com  Fact Sheet for Healthcare Providers: SeriousBroker.it  This test is not yet approved or cleared by the [place] FDA and has been authorized for detection and/or diagnosis of SARS-CoV-2 by FDA under an Emergency Use Authorization (EUA). This EUA will remain in effect (meaning this test can be used) for the duration of the COVID-19 declaration under Section 564(b)(1) of the Act, 21 U.S.C. section 360bbb-3(b)(1), unless the authorization is terminated or revoked.  Performed at [facility], [address], [place], [place] [address]     Radiology Reports CT HEAD WO CONTRAST  Result Date: 11/15/2020 CLINICAL DATA:  Altered mental status EXAM: CT HEAD WITHOUT CONTRAST TECHNIQUE: Contiguous axial images were obtained from the base of the skull through the vertex without intravenous contrast. COMPARISON:  None. FINDINGS: Brain: Normal anatomic configuration. Mild parenchymal volume loss is commensurate with the patient's age. No abnormal intra or extra-axial mass lesion or fluid collection. No abnormal mass effect or midline shift. No evidence of acute intracranial hemorrhage or infarct. Ventricular size is normal. Cerebellum unremarkable. Vascular: Unremarkable Skull: Intact Sinuses/Orbits: There is moderate mucosal thickening within the right maxillary sinus and milder mucosal thickening noted within the right sphenoid sinus, frontal sinuses, and several ethmoid air cells bilaterally. No  air-fluid levels. Remaining paranasal sinuses are clear. Orbits are unremarkable. Other: Mastoid air cells and middle ear cavities are clear. IMPRESSION: No acute intracranial abnormality. Mild paranasal sinus disease. Electronically Signed   By: Helyn Numbers MD   On: 11/04/2020 06:26   DG Chest Portable 1 View  Result Date: 11/17/2020 CLINICAL DATA:  COVID-19 positivity with fevers EXAM: PORTABLE CHEST 1 VIEW COMPARISON:  09/15/2019 FINDINGS: Cardiac shadow is stable. Pacing device is again seen. Patchy opacity is noted in the left base consistent with the given clinical history. No sizable effusion is noted. No bony abnormality is seen. IMPRESSION: Patchy airspace opacity in the left base consistent with the given clinical history of COVID-19 positivity. Electronically Signed   By: Alcide Clever M.D.   On: 11/19/2020 03:01

## 2020-11-02 NOTE — H&P (Signed)
History and Physical    Billy Simon QPR:[id number] DOB: June 04, 1956 DOA: 11/09/2020  PCP: Billy Proffer, MD  Patient coming from: Home.  History obtained from patient's wife.  Chief Complaint: Confusion.  HPI: Billy Simon is a 64 y.o. male with history of schizoaffective disorder, complete heart block status post pacemaker placement who was recently diagnosed with COVID-19 infection on October 31, 2020 was found to be increasingly confused since last evening at home.  Progressively patient found difficult to ambulate also.  Did not have any fall.  As per the patient's wife patient started having fever and weakness about 5 days ago and on December 1 noticed 3 days ago patient was tested for Covid which came positive.  And yesterday that is the following day patient became more weak and confused and not able to ambulate and at this point patient was brought to the ER.  Patient has not been having any shortness of breath nausea vomiting diarrhea or any chest pain.  ED Course: In the ER patient was afebrile and not hypoxic.  Chest x-ray does show a left lower lobe infiltrate consistent with Covid infection.  Patient's Covid test results are not available at this time so we have ordered one which is pending.  EKG shows paced rhythm.  Labs are significant for mildly increased creatinine from baseline AST and ALT around 84 and 65 CRP 3.7 CBC unremarkable D-dimer 1.2.  Patient admitted for acute encephalopathy likely could be from Covid.  CT head is pending.  Review of Systems: As per HPI, rest all negative.   Past Medical History:  Diagnosis Date  . CHB (complete heart block) (HCC) 09/14/2018  . COVID-19   . Syncope 08/2018   Hospitalized at [facility]    Past Surgical History:  Procedure Laterality Date  . LEFT HEART CATH AND CORONARY ANGIOGRAPHY N/A 09/14/2018   Procedure: LEFT HEART CATH AND CORONARY ANGIOGRAPHY;  Surgeon: Swaziland, Peter M, MD;  Location: [facility] INVASIVE  CV LAB;  Service: Cardiovascular;  Laterality: N/A;  . PACEMAKER IMPLANT N/A 09/16/2018   Procedure: PACEMAKER IMPLANT;  Surgeon: Regan Lemming, MD;  Location: [facility] INVASIVE CV LAB;  Service: Cardiovascular;  Laterality: N/A;  . TEMPORARY PACEMAKER N/A 09/14/2018   Procedure: TEMPORARY PACEMAKER;  Surgeon: Swaziland, Peter M, MD;  Location: [facility] INVASIVE CV LAB;  Service: Cardiovascular;  Laterality: N/A;     reports that he has never smoked. He has never used smokeless tobacco. He reports that he does not drink alcohol. No history on file for drug use.  Allergies  Allergen Reactions  . Duragesic-100 [Fentanyl] Other (See Comments)    Pt became violent    Family History  Problem Relation Age of Onset  . Leukemia Mother   . Heart attack Father   . Heart Problems Father        pacemaker    Prior to Admission medications   Medication Sig Start Date End Date Taking? Authorizing Provider  ALPRAZolam Prudy Feeler) 0.5 MG tablet Take 1 tablet (0.5 mg total) by mouth 2 (two) times daily as needed for anxiety. 10/11/18  Yes Barnetta Chapel, MD  ALPRAZolam Prudy Feeler) 1 MG tablet Take 1 mg by mouth 2 (two) times daily as needed. 05/07/20  Yes [provider]  ascorbic acid (VITAMIN C) 500 MG tablet Take 500 mg by mouth 2 (two) times daily.   Yes [provider]  aspirin 81 MG EC tablet Take 81 mg by mouth daily.    Yes  [provider]  atorvastatin (LIPITOR) 40 MG tablet Take 40 mg by mouth at bedtime. 09/06/20  Yes [provider]  benztropine (COGENTIN) 1 MG tablet Take 1 mg by mouth See admin instructions. 2 tabs in the morning 1 tab hs   Yes [provider]  buPROPion (WELLBUTRIN XL) 300 MG 24 hr tablet Take 300 mg by mouth daily.   Yes [provider]  busPIRone (BUSPAR) 15 MG tablet Take 1 tablet (15 mg total) by mouth daily. 10/12/18  Yes Berton Mount I, MD  Cholecalciferol (VITAMIN D3) 50 MCG (2000 UT) CAPS Take 1 capsule by mouth  daily.   Yes [provider]  citalopram (CELEXA) 40 MG tablet Take 40 mg by mouth daily.   Yes [provider]  clopidogrel (PLAVIX) 75 MG tablet Take 75 mg by mouth daily. 09/30/20  Yes [provider]  meclizine (ANTIVERT) 25 MG tablet Take 25 mg by mouth 3 (three) times daily as needed for dizziness.   Yes [provider]  pantoprazole (PROTONIX) 40 MG tablet Take 40 mg by mouth daily. 05/08/19  Yes [provider]  risperiDONE (RISPERDAL) 1 MG tablet Take 1 mg by mouth 3 (three) times daily.  10/26/20  Yes [provider]  tamsulosin (FLOMAX) 0.4 MG CAPS capsule Take 0.4 mg by mouth daily. 10/11/20  Yes [provider]  Zinc 100 MG TABS Take 1 tablet by mouth daily.   Yes [provider]  risperiDONE (RISPERDAL) 1 MG/ML oral solution Place 2 mLs (2 mg total) into feeding tube 3 (three) times daily. Patient not taking: Reported on 11/11/2020 10/11/18   Barnetta Chapel, MD    Physical Exam: Constitutional: Moderately built and nourished. Vitals:   11/15/2020 0224 11/20/2020 0408 11/24/2020 0415  BP: 105/66 125/70 113/67  Pulse: 81 70 70  Resp: 16 (!) 27 (!) 25  Temp: 99.2 F (37.3 C) 99 F (37.2 C)   TempSrc: Axillary Oral   SpO2: 95% 94% 94%  Weight: 105 kg    Height: 5\' 9"  (1.753 m)     Eyes: Anicteric no pallor. ENMT: No discharge from the ears eyes nose or mouth. Neck: No mass felt.  No neck rigidity. Respiratory: No rhonchi or crepitations. Cardiovascular: S1-S2 heard. Abdomen: Soft nontender bowel sounds present. Musculoskeletal: No edema. Skin: No rash. Neurologic: Alert awake but completely disoriented and does not follow commands.  Patient does move all extremities.  Pupils are equal and reacting to light. Psychiatric: Confused.   Labs on Admission: I have personally reviewed following labs and imaging studies  CBC: Recent Labs  Lab 11/14/2020 0234  WBC 5.3  NEUTROABS 4.5  HGB 14.2  HCT 43.3   MCV 87.1  PLT 152   Basic Metabolic Panel: Recent Labs  Lab 11/28/2020 0234  NA 137  K 4.0  CL 104  CO2 21*  GLUCOSE 125*  BUN 15  CREATININE 1.31*  CALCIUM 8.5*   GFR: Estimated Creatinine Clearance: 68 mL/min (A) (by C-G formula based on SCr of 1.31 mg/dL (H)). Liver Function Tests: Recent Labs  Lab 11/18/2020 0234  AST 84*  ALT 65*  ALKPHOS 50  BILITOT 0.5  PROT 6.2*  ALBUMIN 3.4*   No results for input(s): LIPASE, AMYLASE in the last 168 hours. No results for input(s): AMMONIA in the last 168 hours. Coagulation Profile: No results for input(s): INR, PROTIME in the last 168 hours. Cardiac Enzymes: No results for input(s): CKTOTAL, CKMB, CKMBINDEX, TROPONINI in the last 168 hours.  BNP (last 3 results) No results for input(s): PROBNP in the last 8760 hours. HbA1C: No results for input(s): HGBA1C in the last 72 hours. CBG: No results for input(s): GLUCAP in the last 168 hours. Lipid Profile: No results for input(s): CHOL, HDL, LDLCALC, TRIG, CHOLHDL, LDLDIRECT in the last 72 hours. Thyroid Function Tests: No results for input(s): TSH, T4TOTAL, FREET4, T3FREE, THYROIDAB in the last 72 hours. Anemia Panel: No results for input(s): VITAMINB12, FOLATE, FERRITIN, TIBC, IRON, RETICCTPCT in the last 72 hours. Urine analysis:    Component Value Date/Time   COLORURINE YELLOW 09/18/2018 0813   APPEARANCEUR CLEAR 09/18/2018 0813   LABSPEC 1.023 09/18/2018 0813   PHURINE 5.0 09/18/2018 0813   GLUCOSEU NEGATIVE 09/18/2018 0813   HGBUR LARGE (A) 09/18/2018 0813   BILIRUBINUR NEGATIVE 09/18/2018 0813   KETONESUR 5 (A) 09/18/2018 0813   PROTEINUR 100 (A) 09/18/2018 0813   NITRITE NEGATIVE 09/18/2018 0813   LEUKOCYTESUR NEGATIVE 09/18/2018 0813   Sepsis Labs: @LABRCNTIP (procalcitonin:4,lacticidven:4) )No results found for this or any previous visit (from the past 240 hour(s)).   Radiological Exams on Admission: DG Chest Portable 1 View  Result Date:  11-11-2020 CLINICAL DATA:  COVID-19 positivity with fevers EXAM: PORTABLE CHEST 1 VIEW COMPARISON:  09/15/2019 FINDINGS: Cardiac shadow is stable. Pacing device is again seen. Patchy opacity is noted in the left base consistent with the given clinical history. No sizable effusion is noted. No bony abnormality is seen. IMPRESSION: Patchy airspace opacity in the left base consistent with the given clinical history of COVID-19 positivity. Electronically Signed   By: 09/17/2019 M.D.   On: November 11, 2020 03:01    EKG: Independently reviewed.  Paced rhythm.  Assessment/Plan Principal Problem:   Acute encephalopathy Active Problems:   CHB (complete heart block) (HCC)   Schizoaffective disorder, depressive type (HCC)   COVID-19 virus infection    1. Acute encephalopathy cause not clear but could be related to Covid infection.  CT head is pending at this time.  Will check ammonia levels TSH RPR B12 and folate levels with next blood draw.  Check urine drug screen.  Will consult neurology. 2. COVID-19 infection with x-ray showing infiltrates but patient is not hypoxic or febrile at this time.  We will closely monitor respiratory status and inflammatory markers.  Note that patient's Covid test is pending we are and we do not have any access to patient's outside result which was only conveyed by patient's wife to 14/01/2020 verbally. 3. History of schizoaffective disorder on Cogentin Risperdal Wellbutrin BuSpar.  If patient passes swallow we will continue with these medications. 4. Mildly elevated LFTs could be from Covid.  Follow metabolic panel closely.  Abdomen appears benign on exam. 5. History of complete heart block status post pacemaker placement.  Given the patient has acute encephalopathy in the setting of Covid infection will need close monitoring for any further worsening in patient status.   DVT prophylaxis: SCDs for now in anticipation of possible procedure if required and also CT head is pending. Code  Status: Full code confirmed with patient's wife.  Patient's wife states she will be getting back after discussing with patient's son. Family Communication: Patient's wife. Disposition Plan: To be determined. Consults called: Speech therapist for swallow. Admission status: Inpatient.   [place] MD [facility] Pager [phone].  If 7PM-7AM, please contact night-coverage [email or web address] Password [facility]  11-Nov-2020, 5:38 AM

## 2020-11-02 NOTE — ED Notes (Signed)
Patient's wife leaving ED. Curtains opened in patient's room for staff to monitor.

## 2020-11-02 NOTE — ED Notes (Signed)
Patient returned from CT

## 2020-11-02 NOTE — ED Notes (Signed)
Patient pulling at monitor wires and attempting to pull cords out of monitor. Medicated as ordered.

## 2020-11-02 NOTE — ED Triage Notes (Signed)
Patient tested positive for Covid19 this week family reported confusion , fever with generalized weakness/fatigue . He is unvaccinated against Covid , respirations unlabored .

## 2020-11-03 ENCOUNTER — Inpatient Hospital Stay (HOSPITAL_COMMUNITY): Payer: Medicaid Other

## 2020-11-03 LAB — CBG MONITORING, ED
Glucose-Capillary: 80 mg/dL (ref 70–99)
Glucose-Capillary: 81 mg/dL (ref 70–99)
Glucose-Capillary: 98 mg/dL (ref 70–99)
Glucose-Capillary: 120 mg/dL — ABNORMAL HIGH (ref 70–99)

## 2020-11-03 LAB — BRAIN NATRIURETIC PEPTIDE: B Natriuretic Peptide: 49.2 pg/mL (ref 0.0–100.0)

## 2020-11-03 LAB — COMPREHENSIVE METABOLIC PANEL
ALT: 90 U/L — ABNORMAL HIGH (ref 0–44)
AST: 121 U/L — ABNORMAL HIGH (ref 15–41)
Albumin: 3.3 g/dL — ABNORMAL LOW (ref 3.5–5.0)
Alkaline Phosphatase: 52 U/L (ref 38–126)
Anion gap: 12 (ref 5–15)
BUN: 12 mg/dL (ref 8–23)
CO2: 24 mmol/L (ref 22–32)
Calcium: 8.2 mg/dL — ABNORMAL LOW (ref 8.9–10.3)
Chloride: 105 mmol/L (ref 98–111)
Creatinine, Ser: 1.09 mg/dL (ref 0.61–1.24)
GFR, Estimated: >60 mL/min (ref >60)
Glucose, Bld: 92 mg/dL (ref 70–99)
Potassium: 3.9 mmol/L (ref 3.5–5.1)
Sodium: 141 mmol/L (ref 135–145)
Total Bilirubin: 0.7 mg/dL (ref 0.3–1.2)
Total Protein: 6.2 g/dL — ABNORMAL LOW (ref 6.5–8.1)

## 2020-11-03 LAB — C-REACTIVE PROTEIN: CRP: 3.2 mg/dL — ABNORMAL HIGH (ref <1.0)

## 2020-11-03 LAB — CBC
HCT: 43.6 % (ref 39.0–52.0)
Hemoglobin: 14 g/dL (ref 13.0–17.0)
MCH: 28.5 pg (ref 26.0–34.0)
MCHC: 32.1 g/dL (ref 30.0–36.0)
MCV: 88.8 fL (ref 80.0–100.0)
Platelets: 159 10*3/uL (ref 150–400)
RBC: 4.91 MIL/uL (ref 4.22–5.81)
RDW: 14.1 % (ref 11.5–15.5)
WBC: 5.3 10*3/uL (ref 4.0–10.5)
nRBC: 0 % (ref 0.0–0.2)

## 2020-11-03 LAB — MAGNESIUM: Magnesium: 1.7 mg/dL (ref 1.7–2.4)

## 2020-11-03 LAB — D-DIMER, QUANTITATIVE: D-Dimer, Quant: 1.14 ug/mL-FEU — ABNORMAL HIGH (ref 0.00–0.50)

## 2020-11-03 MED ORDER — HALOPERIDOL LACTATE 5 MG/ML IJ SOLN
INTRAMUSCULAR | Status: AC
  Administered 2020-11-03: 2 mg via INTRAVENOUS
  Filled 2020-11-03: qty 1

## 2020-11-03 MED ORDER — SODIUM CHLORIDE 0.9 % IV SOLN
100.0000 mg | Freq: Every day | INTRAVENOUS | Status: DC
  Filled 2020-11-03: qty 20

## 2020-11-03 MED ORDER — SODIUM CHLORIDE 0.9 % IV SOLN: INTRAVENOUS | Status: DC

## 2020-11-03 MED ORDER — HALOPERIDOL LACTATE 5 MG/ML IJ SOLN
3.0000 mg | INTRAMUSCULAR | Status: DC | PRN
  Administered 2020-11-03 – 2020-11-04 (×4): 3 mg via INTRAMUSCULAR
  Filled 2020-11-03 (×3): qty 1

## 2020-11-03 MED ORDER — ALPRAZOLAM 0.5 MG PO TABS
0.5000 mg | ORAL_TABLET | Freq: Two times a day (BID) | ORAL | Status: DC | PRN
  Administered 2020-11-03 – 2020-11-04 (×3): 0.5 mg via ORAL
  Filled 2020-11-03: qty 2
  Filled 2020-11-03: qty 1
  Filled 2020-11-03: qty 2

## 2020-11-03 MED ORDER — HALOPERIDOL LACTATE 5 MG/ML IJ SOLN
2.0000 mg | Freq: Four times a day (QID) | INTRAMUSCULAR | Status: DC | PRN
  Filled 2020-11-03 (×2): qty 1

## 2020-11-03 MED ORDER — MAGNESIUM SULFATE 2 GM/50ML IV SOLN
2.0000 g | Freq: Once | INTRAVENOUS | Status: AC
  Administered 2020-11-03: 2 g via INTRAVENOUS
  Filled 2020-11-03: qty 50

## 2020-11-03 MED ORDER — HALOPERIDOL LACTATE 5 MG/ML IJ SOLN
4.0000 mg | Freq: Once | INTRAMUSCULAR | Status: AC
  Administered 2020-11-03: 4 mg via INTRAVENOUS

## 2020-11-03 MED ORDER — HALOPERIDOL LACTATE 5 MG/ML IJ SOLN: 2.0000 mg | Freq: Once | INTRAMUSCULAR | Status: AC

## 2020-11-03 MED ORDER — SODIUM CHLORIDE 0.9 % IV SOLN
200.0000 mg | Freq: Once | INTRAVENOUS | Status: AC
  Administered 2020-11-04: 200 mg via INTRAVENOUS
  Filled 2020-11-03: qty 40

## 2020-11-03 MED ORDER — DEXAMETHASONE SODIUM PHOSPHATE 10 MG/ML IJ SOLN
6.0000 mg | INTRAMUSCULAR | Status: DC
  Administered 2020-11-03 – 2020-11-11 (×9): 6 mg via INTRAVENOUS
  Filled 2020-11-03 (×9): qty 1

## 2020-11-03 NOTE — ED Notes (Signed)
Cleaned patient on urinary incontinence. Placed patient back  On monitor. Pt continues to attempt to remove monitoring equipment. No saftey sitter in house for patient.

## 2020-11-03 NOTE — ED Notes (Signed)
Patient continues to pull at monitor wires. Patient incontinent of large amount of urine. Sheets and brief changed. Patient attempting to grab wires while turning. Uncooperative with care. Medicated as ordered. Will continue to monitor.

## 2020-11-03 NOTE — ED Notes (Signed)
Patient sleeping. Monitoring re applied. Will continue to monitor.

## 2020-11-03 NOTE — ED Notes (Signed)
Patient constantly being re hooked up to cardiac, bp, and tele monitoring as patient continues to pull at wires. Able to spot check patient. Will continue to monitor.

## 2020-11-03 NOTE — ED Notes (Signed)
Attempt to place PIV unsuccessful. Patient pulling away. Patient placed back on monitor. Resting at this time. Will continue to monitor.

## 2020-11-03 NOTE — Progress Notes (Signed)
PROGRESS NOTE                                                                                                                                                                                                             Patient Demographics:    Billy Simon, is a 64 y.o. male, DOB - [birth year], EAV:[id number]  Outpatient Primary MD for the patient is Galvin ProfferHague, Imran P, MD    LOS - 1  Admit date - 11/24/2020    Chief Complaint  Patient presents with  . Covid+ / Confused w/ fever       Brief Narrative (HPI from H&P)  Billy Simon is a 64 y.o. male with history of schizoaffective disorder, complete heart block status post pacemaker placement who was recently diagnosed with COVID-19 infection on October 31, 2020 was found to be increasingly confused since last evening at home, in the ER he was found to be encephalopathic, he had evidence of Covid pneumonia on chest x-ray, he also had urinary retention and was admitted to the hospital for further care.   Subjective:    Billy Simon today in bed confused, unreliable historian.   Assessment  & Plan :     1. Acute Covid 19 Viral Pneumonitis during the ongoing 2020 Covid 19 Pandemic - he is unfortunately unvaccinated, his chest x-ray evidence of disease, CRP is borderline, will start him on steroids and Remdesivir combination and monitor.  Encouraged the patient to sit up in chair in the daytime use I-S and flutter valve for pulmonary toiletry and then prone in bed when at night.  Will advance activity and titrate down oxygen as possible.  SpO2: 94 % O2 Flow Rate (L/min): 3 L/min  Recent Labs  Lab 03/08/2020 0234 03/08/2020 0400 03/08/2020 0429 03/08/2020 0727 11/03/20 0606  WBC 5.3  --   --  4.9 5.3  HGB 14.2  --   --  14.1 14.0  HCT 43.3  --   --  44.2 43.6  PLT 152  --   --  143* 159  CRP  --  3.7*  --   --  3.2*  BNP  --   --   --   --  49.2  DDIMER  --  1.20*  --   --   1.14*  PROCALCITON  --  <0.10  --   --   --  AST 84* 83*  --   --  121*  ALT 65* 65*  --   --  90*  ALKPHOS 50 48  --   --  52  BILITOT 0.5 0.5  --   --  0.7  ALBUMIN 3.4* 3.3*  --   --  3.3*  SARSCOV2NAA  --   --  POSITIVE*  --   --      2.  Severe Covid viral infection related encephalopathy.  CT head nonacute, encephalopathy has been present for at least 3 days prior to admission per wife at home.  Minimize benzodiazepines and narcotics, of note he takes some benzos at home so will not abruptly stop all.  As needed Haldol.  Soft diet, feeding assistance and aspiration precautions.  3.  History of complete heart block with pacemaker placement.  4.  Schizoaffective disorders.  Home medications continued as needed Haldol.  5.  Dyslipidemia.  On home dose statin.  6.  Urinary retention with BPH.  On Flomax, straight cath done on 11/03/2020 in the ER.  Will monitor bladder scan every shift.  If required will place Foley.     Condition - Extremely Guarded  Family Communication  :  Wife [name] [phone] on 11/03/20  Code Status :  Full  Consults  :  None  Procedures  :    CT Head - Non acute  PUD Prophylaxis : PPI  Disposition Plan  :    Status is: Inpatient  Remains inpatient appropriate because:IV treatments appropriate due to intensity of illness or inability to take PO   Dispo: The patient is from: Home              Anticipated d/c is to: Home              Anticipated d/c date is: > 3 days              Patient currently is not medically stable to d/c.   DVT Prophylaxis  :  Lovenox    Lab Results  Component Value Date   PLT 159 11/03/2020    Diet :  Diet Order            DIET SOFT Room service appropriate? Yes; Fluid consistency: Thin  Diet effective now                  Inpatient Medications  Scheduled Meds: . aspirin EC  81 mg Oral Daily  . atorvastatin  40 mg Oral QHS  . benztropine  2 mg Oral Daily   And  . benztropine  1 mg Oral QHS   . buPROPion  300 mg Oral Daily  . busPIRone  15 mg Oral Daily  . citalopram  40 mg Oral Daily  . clopidogrel  75 mg Oral Daily  . enoxaparin (LOVENOX) injection  40 mg Subcutaneous Daily  . pantoprazole  40 mg Oral Daily  . risperiDONE  1 mg Oral TID  . tamsulosin  0.4 mg Oral Daily   Continuous Infusions: . sodium chloride    . sodium chloride     PRN Meds:.sodium chloride, acetaminophen **OR** acetaminophen, albuterol, ALPRAZolam, haloperidol lactate  Antibiotics  :    Anti-infectives (From admission, onward)   None       Time Spent in minutes  30   Susa Raring M.D on 11/03/2020 at 11:01 AM  To page go to [email or web address] - password [facility]  [facility] -  Office  [phone]   See all  Orders from today for further details    Objective:   Vitals:   11/03/20 0255 11/03/20 0500 11/03/20 0645 11/03/20 0845  BP: 131/73 (!) 145/68 115/69 114/71  Pulse: 84 81 84 80  Resp: 20 16 16 16   Temp:    98.4 F (36.9 C)  TempSrc:    Oral  SpO2: 95% 94% 93% 94%  Weight:      Height:        Wt Readings from Last 3 Encounters:  11/30/2020 105 kg  01/02/20 97.3 kg  10/11/18 84.6 kg     Intake/Output Summary (Last 24 hours) at 11/03/2020 1101 Last data filed at 11/03/2020 0850 Gross per 24 hour  Intake --  Output 600 ml  Net -600 ml     Physical Exam  Awake but confused, No new F.N deficits is moving all 4 extremities [place].AT,PERRAL Supple Neck,No JVD, No cervical lymphadenopathy appriciated.  Symmetrical Chest wall movement, Good air movement bilaterally, CTAB RRR,No Gallops,Rubs or new Murmurs, No Parasternal Heave +ve B.Sounds, Abd Soft, No tenderness, No organomegaly appriciated, No rebound - guarding or rigidity. No Cyanosis, Clubbing or edema, No new Rash or bruise      Data Review:    CBC Recent Labs  Lab 11/01/2020 0234 11/20/2020 0727 11/03/20 0606  WBC 5.3 4.9 5.3  HGB 14.2 14.1 14.0  HCT 43.3 44.2 43.6  PLT 152 143* 159  MCV 87.1 88.9 88.8   MCH 28.6 28.4 28.5  MCHC 32.8 31.9 32.1  RDW 14.1 14.2 14.1  LYMPHSABS 0.4* 0.8  --   MONOABS 0.4 0.5  --   EOSABS 0.0 0.0  --   BASOSABS 0.0 0.0  --     Recent Labs  Lab 11/20/2020 0234 11/14/2020 0400 11/01/2020 0727 11/03/20 0606  NA 137 141  --  141  K 4.0 4.0  --  3.9  CL 104 105  --  105  CO2 21* 23  --  24  GLUCOSE 125* 105*  --  92  BUN 15 15  --  12  CREATININE 1.31* 1.29*  --  1.09  CALCIUM 8.5* 8.5*  --  8.2*  AST 84* 83*  --  121*  ALT 65* 65*  --  90*  ALKPHOS 50 48  --  52  BILITOT 0.5 0.5  --  0.7  ALBUMIN 3.4* 3.3*  --  3.3*  MG  --   --   --  1.7  CRP  --  3.7*  --  3.2*  DDIMER  --  1.20*  --  1.14*  PROCALCITON  --  <0.10  --   --   TSH  --  4.066  --   --   AMMONIA  --   --  33  --   BNP  --   --   --  49.2    ------------------------------------------------------------------------------------------------------------------ No results for input(s): CHOL, HDL, LDLCALC, TRIG, CHOLHDL, LDLDIRECT in the last 72 hours.  No results found for: HGBA1C ------------------------------------------------------------------------------------------------------------------ Recent Labs    10/31/2020 0400  TSH 4.066    Cardiac Enzymes No results for input(s): CKMB, TROPONINI, MYOGLOBIN in the last 168 hours.  Invalid input(s): CK ------------------------------------------------------------------------------------------------------------------    Component Value Date/Time   BNP 49.2 11/03/2020 0606    Micro Results Recent Results (from the past 240 hour(s))  Resp Panel by RT-PCR (Flu A&B, Covid) Nasopharyngeal Swab     Status: Abnormal   Collection Time: 11/10/2020  4:29 AM   Specimen: Nasopharyngeal Swab; Nasopharyngeal(NP)  swabs in vial transport medium  Result Value Ref Range Status   SARS Coronavirus 2 by RT PCR POSITIVE (A) NEGATIVE Final    Comment: RESULT CALLED TO, READ BACK BY AND VERIFIED WITH: A OAKLEY RN 2020-11-07 8022 JDW (NOTE) SARS-CoV-2 target  nucleic acids are DETECTED.  The SARS-CoV-2 RNA is generally detectable in upper respiratory specimens during the acute phase of infection. Positive results are indicative of the presence of the identified virus, but do not rule out bacterial infection or co-infection with other pathogens not detected by the test. Clinical correlation with patient history and other diagnostic information is necessary to determine patient infection status. The expected result is Negative.  Fact Sheet for Patients: BloggerCourse.com  Fact Sheet for Healthcare Providers: SeriousBroker.it  This test is not yet approved or cleared by the [place] FDA and  has been authorized for detection and/or diagnosis of SARS-CoV-2 by FDA under an Emergency Use Authorization (EUA).  This EUA will remain in effect (meaning this test can be used ) for the duration of  the COVID-19 declaration under Section 564(b)(1) of the Act, 21 U.S.C. section 360bbb-3(b)(1), unless the authorization is terminated or revoked sooner.     Influenza A by PCR NEGATIVE NEGATIVE Final   Influenza B by PCR NEGATIVE NEGATIVE Final    Comment: (NOTE) The Xpert Xpress SARS-CoV-2/FLU/RSV plus assay is intended as an aid in the diagnosis of influenza from Nasopharyngeal swab specimens and should not be used as a sole basis for treatment. Nasal washings and aspirates are unacceptable for Xpert Xpress SARS-CoV-2/FLU/RSV testing.  Fact Sheet for Patients: BloggerCourse.com  Fact Sheet for Healthcare Providers: SeriousBroker.it  This test is not yet approved or cleared by the [place] FDA and has been authorized for detection and/or diagnosis of SARS-CoV-2 by FDA under an Emergency Use Authorization (EUA). This EUA will remain in effect (meaning this test can be used) for the duration of the COVID-19 declaration under Section  564(b)(1) of the Act, 21 U.S.C. section 360bbb-3(b)(1), unless the authorization is terminated or revoked.  Performed at [facility], [address], [place], [place] [address]     Radiology Reports CT HEAD WO CONTRAST  Result Date: 11-07-20 CLINICAL DATA:  Altered mental status EXAM: CT HEAD WITHOUT CONTRAST TECHNIQUE: Contiguous axial images were obtained from the base of the skull through the vertex without intravenous contrast. COMPARISON:  None. FINDINGS: Brain: Normal anatomic configuration. Mild parenchymal volume loss is commensurate with the patient's age. No abnormal intra or extra-axial mass lesion or fluid collection. No abnormal mass effect or midline shift. No evidence of acute intracranial hemorrhage or infarct. Ventricular size is normal. Cerebellum unremarkable. Vascular: Unremarkable Skull: Intact Sinuses/Orbits: There is moderate mucosal thickening within the right maxillary sinus and milder mucosal thickening noted within the right sphenoid sinus, frontal sinuses, and several ethmoid air cells bilaterally. No air-fluid levels. Remaining paranasal sinuses are clear. Orbits are unremarkable. Other: Mastoid air cells and middle ear cavities are clear. IMPRESSION: No acute intracranial abnormality. Mild paranasal sinus disease. Electronically Signed   By: Helyn Numbers MD   On: 2020/11/07 06:26   DG Chest Port 1 View  Result Date: 11/03/2020 CLINICAL DATA:  Shortness of breath, COVID-19 positivity EXAM: PORTABLE CHEST 1 VIEW COMPARISON:  11/07/20 FINDINGS: Cardiac shadow is enlarged. Pacing device is again seen. Patchy right upper lobe infiltrate is noted along the minor fissure. Basilar opacity is noted on the left as well. This has increased somewhat in the interval from the  prior exam. No sizable effusion is seen. No bony abnormality is noted. IMPRESSION: Increase in patchy airspace opacities bilaterally consistent with the given clinical history. Electronically Signed    By: Alcide Clever M.D.   On: 11/03/2020 08:32   DG Chest Portable 1 View  Result Date: 11-15-20 CLINICAL DATA:  COVID-19 positivity with fevers EXAM: PORTABLE CHEST 1 VIEW COMPARISON:  09/15/2019 FINDINGS: Cardiac shadow is stable. Pacing device is again seen. Patchy opacity is noted in the left base consistent with the given clinical history. No sizable effusion is noted. No bony abnormality is seen. IMPRESSION: Patchy airspace opacity in the left base consistent with the given clinical history of COVID-19 positivity. Electronically Signed   By: Alcide Clever M.D.   On: 15-Nov-2020 03:01

## 2020-11-03 NOTE — ED Notes (Signed)
Attempted to give patient the remaining morning meds but patient is spitting them out onto his gown. Pt continues to pull monitoring equipment off. MD aware.

## 2020-11-03 NOTE — ED Notes (Signed)
Unable to obtain BP at this time due to patient agitation.

## 2020-11-03 NOTE — ED Notes (Signed)
Assuming care of patient at this time. Pt is ripping monitoring cables off. Pt appears to be agitated. Notified MD in regards for PRN medications. Pt in NAD.

## 2020-11-03 NOTE — ED Notes (Signed)
Pt found to be sliding out of bed. Re positioned patient. Pt still removing monitoring equipment. Verbal orders rec'd by MD for break through 4mg  Halodol IV. Pt medicated per MAR.

## 2020-11-03 NOTE — ED Notes (Signed)
Hospitalist made aware of no IV access. New orders noted.

## 2020-11-03 NOTE — ED Notes (Signed)
Patient self removing monitoring. IV found on floor. Bed sheets and brief changed. Monitoring re applied and vital signs assessed. Will continue to monitor.

## 2020-11-03 NOTE — ED Notes (Signed)
Breakfast Ordered 

## 2020-11-03 NOTE — ED Notes (Addendum)
Bladcder scanned aptient per MD's request due to possible retention. Bladder scanner showed >533mL of urine. Physican requesting patient to be straight cath's. Straight cath'd patient with of concentrated urine. Provider notified.

## 2020-11-03 NOTE — ED Notes (Signed)
Patient continues to remove monitoring but does allow spot checks.. Attempt to redirect patient without success.

## 2020-11-04 LAB — COMPREHENSIVE METABOLIC PANEL
ALT: 159 U/L — ABNORMAL HIGH (ref 0–44)
AST: 223 U/L — ABNORMAL HIGH (ref 15–41)
Albumin: 3.3 g/dL — ABNORMAL LOW (ref 3.5–5.0)
Alkaline Phosphatase: 55 U/L (ref 38–126)
Anion gap: 13 (ref 5–15)
BUN: 16 mg/dL (ref 8–23)
CO2: 24 mmol/L (ref 22–32)
Calcium: 8.5 mg/dL — ABNORMAL LOW (ref 8.9–10.3)
Chloride: 107 mmol/L (ref 98–111)
Creatinine, Ser: 0.96 mg/dL (ref 0.61–1.24)
GFR, Estimated: >60 mL/min (ref >60)
Glucose, Bld: 102 mg/dL — ABNORMAL HIGH (ref 70–99)
Potassium: 5.1 mmol/L (ref 3.5–5.1)
Sodium: 144 mmol/L (ref 135–145)
Total Bilirubin: 1.4 mg/dL — ABNORMAL HIGH (ref 0.3–1.2)
Total Protein: 6.4 g/dL — ABNORMAL LOW (ref 6.5–8.1)

## 2020-11-04 LAB — CBC
HCT: 45.8 % (ref 39.0–52.0)
Hemoglobin: 15 g/dL (ref 13.0–17.0)
MCH: 28.6 pg (ref 26.0–34.0)
MCHC: 32.8 g/dL (ref 30.0–36.0)
MCV: 87.2 fL (ref 80.0–100.0)
Platelets: 183 10*3/uL (ref 150–400)
RBC: 5.25 MIL/uL (ref 4.22–5.81)
RDW: 14 % (ref 11.5–15.5)
WBC: 7.2 10*3/uL (ref 4.0–10.5)
nRBC: 0 % (ref 0.0–0.2)

## 2020-11-04 LAB — D-DIMER, QUANTITATIVE: D-Dimer, Quant: 1.19 ug/mL-FEU — ABNORMAL HIGH (ref 0.00–0.50)

## 2020-11-04 LAB — GLUCOSE, CAPILLARY
Glucose-Capillary: 108 mg/dL — ABNORMAL HIGH (ref 70–99)
Glucose-Capillary: 112 mg/dL — ABNORMAL HIGH (ref 70–99)
Glucose-Capillary: 116 mg/dL — ABNORMAL HIGH (ref 70–99)

## 2020-11-04 LAB — MAGNESIUM: Magnesium: 2.1 mg/dL (ref 1.7–2.4)

## 2020-11-04 LAB — C-REACTIVE PROTEIN: CRP: 4.6 mg/dL — ABNORMAL HIGH (ref <1.0)

## 2020-11-04 LAB — CBG MONITORING, ED
Glucose-Capillary: 116 mg/dL — ABNORMAL HIGH (ref 70–99)
Glucose-Capillary: 99 mg/dL (ref 70–99)

## 2020-11-04 LAB — BRAIN NATRIURETIC PEPTIDE: B Natriuretic Peptide: 54.9 pg/mL (ref 0.0–100.0)

## 2020-11-04 LAB — PROCALCITONIN: Procalcitonin: <0.1 ng/mL

## 2020-11-04 MED ORDER — HALOPERIDOL LACTATE 5 MG/ML IJ SOLN
3.0000 mg | Freq: Four times a day (QID) | INTRAMUSCULAR | Status: DC | PRN
  Administered 2020-11-04: 3 mg via INTRAVENOUS
  Filled 2020-11-04: qty 1

## 2020-11-04 MED ORDER — SODIUM CHLORIDE 0.9 % IV SOLN: INTRAVENOUS | Status: AC

## 2020-11-04 MED ORDER — MAGNESIUM OXIDE 400 (241.3 MG) MG PO TABS
800.0000 mg | ORAL_TABLET | Freq: Once | ORAL | Status: AC
  Administered 2020-11-04: 800 mg via ORAL
  Filled 2020-11-04: qty 2

## 2020-11-04 MED ORDER — SODIUM CHLORIDE 0.9 % IV SOLN
100.0000 mg | Freq: Every day | INTRAVENOUS | Status: AC
  Administered 2020-11-05 – 2020-11-08 (×4): 100 mg via INTRAVENOUS
  Filled 2020-11-04 (×4): qty 20

## 2020-11-04 MED ORDER — QUETIAPINE FUMARATE 100 MG PO TABS
100.0000 mg | ORAL_TABLET | Freq: Two times a day (BID) | ORAL | Status: DC
  Administered 2020-11-04 (×2): 100 mg via ORAL
  Filled 2020-11-04 (×2): qty 1

## 2020-11-04 MED ORDER — MAGNESIUM SULFATE IN D5W 1-5 GM/100ML-% IV SOLN
1.0000 g | Freq: Once | INTRAVENOUS | Status: DC
  Filled 2020-11-04 (×2): qty 100

## 2020-11-04 MED ORDER — HALOPERIDOL LACTATE 5 MG/ML IJ SOLN
5.0000 mg | Freq: Four times a day (QID) | INTRAMUSCULAR | Status: DC | PRN
  Administered 2020-11-05: 5 mg via INTRAMUSCULAR
  Filled 2020-11-04: qty 1

## 2020-11-04 NOTE — ED Notes (Signed)
Unable to start IV fluids and mag. Pt is constantly up and pulling at IV tubing.

## 2020-11-04 NOTE — ED Notes (Signed)
Patient remains asleep. Attempted to place blankets on patient but he throws them off him. Monitoring in place. Awaiting inpatient bed assignment. Will continue to monitor.

## 2020-11-04 NOTE — ED Notes (Signed)
Report called and given  

## 2020-11-04 NOTE — Progress Notes (Addendum)
PROGRESS NOTE                                                                                                                                                                                                             Patient Demographics:    Billy GhentJohn Simon, is a 64 y.o. male, DOB - October 04, 1956, ZOX:[id number]  Outpatient Primary MD for the patient is Galvin ProfferHague, Imran P, MD    LOS - 2  Admit date - 11/08/2020    Chief Complaint  Patient presents with  . Covid+ / Confused w/ fever       Brief Narrative (HPI from H&P)  Billy CanterJohn Thomas Simon is a 64 y.o. male with history of schizoaffective disorder, complete heart block status post pacemaker placement who was recently diagnosed with COVID-19 infection on October 31, 2020 was found to be increasingly confused since last evening at home, in the ER he was found to be encephalopathic, he had evidence of Covid pneumonia on chest x-ray, he also had urinary retention and was admitted to the hospital for further care.   Subjective:    Patient in bed,mildly confused but appears comfortable, denies any headache, no fever, no chest pain or pressure, no shortness of breath , no abdominal pain. No focal weakness. .   Assessment  & Plan :     1. Acute Covid 19 Viral Pneumonitis during the ongoing 2020 Covid 19 Pandemic - he is unfortunately unvaccinated, his chest x-ray evidence of disease, CRP is borderline, will start him on steroids and Remdesivir combination and monitor.  Encouraged the patient to sit up in chair in the daytime use I-S and flutter valve for pulmonary toiletry and then prone in bed when at night.  Will advance activity and titrate down oxygen as possible.  SpO2: 93 % O2 Flow Rate (L/min): 4 L/min  Recent Labs  Lab 11/05/2020 0234 11/01/2020 0400 10/31/2020 0429 11/03/2020 0727 11/03/20 0606  WBC 5.3  --   --  4.9 5.3  HGB 14.2  --   --  14.1 14.0  HCT 43.3  --   --  44.2 43.6   PLT 152  --   --  143* 159  CRP  --  3.7*  --   --  3.2*  BNP  --   --   --   --  49.2  DDIMER  --  1.20*  --   --  1.14*  PROCALCITON  --  <0.10  --   --   --   AST 84* 83*  --   --  121*  ALT 65* 65*  --   --  90*  ALKPHOS 50 48  --   --  52  BILITOT 0.5 0.5  --   --  0.7  ALBUMIN 3.4* 3.3*  --   --  3.3*  SARSCOV2NAA  --   --  POSITIVE*  --   --      2.  Severe Covid viral infection related encephalopathy.  CT head nonacute, encephalopathy has been present for at least 3 days prior to admission per wife at home.  Minimize benzodiazepines and narcotics, of note he takes some benzos at home so will not abruptly stop all.  As needed Haldol along with scheduled Seroquel, mittens if needed restraints if needed.  Soft diet, feeding assistance and aspiration precautions.  3.  History of complete heart block with pacemaker placement.  4.  Schizoaffective disorders.  Home medications adjusted on 11/04/2020 to provide room for Haldol and Seroquel to control severe encephalopathy.  5.  Dyslipidemia.  On home dose statin.  6.  Urinary retention with BPH.  On Flomax, straight cath done on 11/03/2020 in the ER.  Will monitor bladder scan every shift.  If required will place Foley.     Condition - Extremely Guarded  Family Communication  :  Wife [name] [phone] on 11/03/20, message left 11/04/2020 at 8:11 AM  Code Status :  Full  Consults  :  None  Procedures  :    CT Head - Non acute  PUD Prophylaxis : PPI  Disposition Plan  :    Status is: Inpatient  Remains inpatient appropriate because:IV treatments appropriate due to intensity of illness or inability to take PO   Dispo: The patient is from: Home              Anticipated d/c is to: Home              Anticipated d/c date is: > 3 days              Patient currently is not medically stable to d/c.   DVT Prophylaxis  :  Lovenox    Lab Results  Component Value Date   PLT 159 11/03/2020    Diet :  Diet Order             DIET SOFT Room service appropriate? Yes; Fluid consistency: Thin  Diet effective now                  Inpatient Medications  Scheduled Meds: . aspirin EC  81 mg Oral Daily  . atorvastatin  40 mg Oral QHS  . benztropine  2 mg Oral Daily   And  . benztropine  1 mg Oral QHS  . buPROPion  300 mg Oral Daily  . busPIRone  15 mg Oral Daily  . citalopram  40 mg Oral Daily  . clopidogrel  75 mg Oral Daily  . dexamethasone (DECADRON) injection  6 mg Intravenous Q24H  . enoxaparin (LOVENOX) injection  40 mg Subcutaneous Daily  . pantoprazole  40 mg Oral Daily  . risperiDONE  1 mg Oral TID  . tamsulosin  0.4 mg Oral Daily   Continuous Infusions: . sodium chloride    . sodium chloride    . remdesivir 200 mg  in sodium chloride 0.9% 250 mL IVPB     Followed by  . remdesivir 100 mg in NS 100 mL     PRN Meds:.sodium chloride, acetaminophen **OR** acetaminophen, albuterol, ALPRAZolam, haloperidol lactate  Antibiotics  :    Anti-infectives (From admission, onward)   Start     Dose/Rate Route Frequency Ordered Stop   11/04/20 1000  remdesivir 100 mg in sodium chloride 0.9 % 100 mL IVPB       "Followed by" Linked Group Details   100 mg 200 mL/hr over 30 Minutes Intravenous Daily 11/03/20 1120 11/08/20 0959   11/03/20 1130  remdesivir 200 mg in sodium chloride 0.9% 250 mL IVPB       "Followed by" Linked Group Details   200 mg 580 mL/hr over 30 Minutes Intravenous Once 11/03/20 1120         Time Spent in minutes  30   Susa Raring M.D on 11/04/2020 at 8:09 AM  To page go to [email or web address] - password [email or web address]  [facility] -  Office  [phone]   See all Orders from today for further details    Objective:   Vitals:   11/04/20 0300 11/04/20 0400 11/04/20 0426 11/04/20 0750  BP: 117/69 129/68 139/72 123/61  Pulse:  85 91 77  Resp:   18 17  Temp:   98 F (36.7 C) (!) 97 F (36.1 C)  TempSrc:   Oral Axillary  SpO2:  90% 91% 93%  Weight:      Height:         Wt Readings from Last 3 Encounters:  10/31/2020 105 kg  01/02/20 97.3 kg  10/11/18 84.6 kg     Intake/Output Summary (Last 24 hours) at 11/04/2020 0809 Last data filed at 11/03/2020 1234 Gross per 24 hour  Intake 44.61 ml  Output 600 ml  Net -555.39 ml     Physical Exam  Awake but confused, No new F.N deficits is moving all 4 extremities [place].AT,PERRAL Supple Neck,No JVD, No cervical lymphadenopathy appriciated.  Symmetrical Chest wall movement, Good air movement bilaterally, CTAB RRR,No Gallops, Rubs or new Murmurs, No Parasternal Heave +ve B.Sounds, Abd Soft, No tenderness, No organomegaly appriciated, No rebound - guarding or rigidity. No Cyanosis, Clubbing or edema, No new Rash or bruise      Data Review:    CBC Recent Labs  Lab 11/20/2020 0234 11/19/2020 0727 11/03/20 0606  WBC 5.3 4.9 5.3  HGB 14.2 14.1 14.0  HCT 43.3 44.2 43.6  PLT 152 143* 159  MCV 87.1 88.9 88.8  MCH 28.6 28.4 28.5  MCHC 32.8 31.9 32.1  RDW 14.1 14.2 14.1  LYMPHSABS 0.4* 0.8  --   MONOABS 0.4 0.5  --   EOSABS 0.0 0.0  --   BASOSABS 0.0 0.0  --     Recent Labs  Lab 11/27/2020 0234 11/03/2020 0400 11/25/2020 0727 11/03/20 0606  NA 137 141  --  141  K 4.0 4.0  --  3.9  CL 104 105  --  105  CO2 21* 23  --  24  GLUCOSE 125* 105*  --  92  BUN 15 15  --  12  CREATININE 1.31* 1.29*  --  1.09  CALCIUM 8.5* 8.5*  --  8.2*  AST 84* 83*  --  121*  ALT 65* 65*  --  90*  ALKPHOS 50 48  --  52  BILITOT 0.5 0.5  --  0.7  ALBUMIN 3.4* 3.3*  --  3.3*  MG  --   --   --  1.7  CRP  --  3.7*  --  3.2*  DDIMER  --  1.20*  --  1.14*  PROCALCITON  --  <0.10  --   --   TSH  --  4.066  --   --   AMMONIA  --   --  33  --   BNP  --   --   --  49.2    ------------------------------------------------------------------------------------------------------------------ No results for input(s): CHOL, HDL, LDLCALC, TRIG, CHOLHDL, LDLDIRECT in the last 72 hours.  No results found for:  HGBA1C ------------------------------------------------------------------------------------------------------------------ Recent Labs    11/07/2020 0400  TSH 4.066    Cardiac Enzymes No results for input(s): CKMB, TROPONINI, MYOGLOBIN in the last 168 hours.  Invalid input(s): CK ------------------------------------------------------------------------------------------------------------------    Component Value Date/Time   BNP 49.2 11/03/2020 0606    Micro Results Recent Results (from the past 240 hour(s))  Resp Panel by RT-PCR (Flu A&B, Covid) Nasopharyngeal Swab     Status: Abnormal   Collection Time: 11/01/2020  4:29 AM   Specimen: Nasopharyngeal Swab; Nasopharyngeal(NP) swabs in vial transport medium  Result Value Ref Range Status   SARS Coronavirus 2 by RT PCR POSITIVE (A) NEGATIVE Final    Comment: RESULT CALLED TO, READ BACK BY AND VERIFIED WITH: A OAKLEY RN 11/06/2020 1610 JDW (NOTE) SARS-CoV-2 target nucleic acids are DETECTED.  The SARS-CoV-2 RNA is generally detectable in upper respiratory specimens during the acute phase of infection. Positive results are indicative of the presence of the identified virus, but do not rule out bacterial infection or co-infection with other pathogens not detected by the test. Clinical correlation with patient history and other diagnostic information is necessary to determine patient infection status. The expected result is Negative.  Fact Sheet for Patients: BloggerCourse.com  Fact Sheet for Healthcare Providers: SeriousBroker.it  This test is not yet approved or cleared by the [place] FDA and  has been authorized for detection and/or diagnosis of SARS-CoV-2 by FDA under an Emergency Use Authorization (EUA).  This EUA will remain in effect (meaning this test can be used ) for the duration of  the COVID-19 declaration under Section 564(b)(1) of the Act, 21 U.S.C. section  360bbb-3(b)(1), unless the authorization is terminated or revoked sooner.     Influenza A by PCR NEGATIVE NEGATIVE Final   Influenza B by PCR NEGATIVE NEGATIVE Final    Comment: (NOTE) The Xpert Xpress SARS-CoV-2/FLU/RSV plus assay is intended as an aid in the diagnosis of influenza from Nasopharyngeal swab specimens and should not be used as a sole basis for treatment. Nasal washings and aspirates are unacceptable for Xpert Xpress SARS-CoV-2/FLU/RSV testing.  Fact Sheet for Patients: BloggerCourse.com  Fact Sheet for Healthcare Providers: SeriousBroker.it  This test is not yet approved or cleared by the [place] FDA and has been authorized for detection and/or diagnosis of SARS-CoV-2 by FDA under an Emergency Use Authorization (EUA). This EUA will remain in effect (meaning this test can be used) for the duration of the COVID-19 declaration under Section 564(b)(1) of the Act, 21 U.S.C. section 360bbb-3(b)(1), unless the authorization is terminated or revoked.  Performed at [facility], [address], [place], [place] [address]     Radiology Reports CT HEAD WO CONTRAST  Result Date: 11/19/2020 CLINICAL DATA:  Altered mental status EXAM: CT HEAD WITHOUT CONTRAST TECHNIQUE: Contiguous axial images were obtained from the base of the skull through the vertex without intravenous contrast. COMPARISON:  None. FINDINGS: Brain: Normal anatomic configuration. Mild parenchymal volume  loss is commensurate with the patient's age. No abnormal intra or extra-axial mass lesion or fluid collection. No abnormal mass effect or midline shift. No evidence of acute intracranial hemorrhage or infarct. Ventricular size is normal. Cerebellum unremarkable. Vascular: Unremarkable Skull: Intact Sinuses/Orbits: There is moderate mucosal thickening within the right maxillary sinus and milder mucosal thickening noted within the right sphenoid sinus,  frontal sinuses, and several ethmoid air cells bilaterally. No air-fluid levels. Remaining paranasal sinuses are clear. Orbits are unremarkable. Other: Mastoid air cells and middle ear cavities are clear. IMPRESSION: No acute intracranial abnormality. Mild paranasal sinus disease. Electronically Signed   By: Helyn Numbers MD   On: 11/07/2020 06:26   DG Chest Port 1 View  Result Date: 11/03/2020 CLINICAL DATA:  Shortness of breath, COVID-19 positivity EXAM: PORTABLE CHEST 1 VIEW COMPARISON:  11/18/2020 FINDINGS: Cardiac shadow is enlarged. Pacing device is again seen. Patchy right upper lobe infiltrate is noted along the minor fissure. Basilar opacity is noted on the left as well. This has increased somewhat in the interval from the prior exam. No sizable effusion is seen. No bony abnormality is noted. IMPRESSION: Increase in patchy airspace opacities bilaterally consistent with the given clinical history. Electronically Signed   By: Alcide Clever M.D.   On: 11/03/2020 08:32   DG Chest Portable 1 View  Result Date: 11/27/2020 CLINICAL DATA:  COVID-19 positivity with fevers EXAM: PORTABLE CHEST 1 VIEW COMPARISON:  09/15/2019 FINDINGS: Cardiac shadow is stable. Pacing device is again seen. Patchy opacity is noted in the left base consistent with the given clinical history. No sizable effusion is noted. No bony abnormality is seen. IMPRESSION: Patchy airspace opacity in the left base consistent with the given clinical history of COVID-19 positivity. Electronically Signed   By: Alcide Clever M.D.   On: 11/01/2020 03:01

## 2020-11-04 NOTE — ED Notes (Signed)
Patient self removed monitoring. Wife updated. Will continue to monitor.

## 2020-11-04 NOTE — ED Notes (Signed)
Pt's gown, brief, pulse ox, and BP cuff were reapplied. Pt is resting. Will continue to monitor.

## 2020-11-04 NOTE — Progress Notes (Signed)
Patient continuously pulls off telemetry leads, is restless, pulls of high flow nasal cannula and non-rebreather mask, kicking, swinging arms, pinching, spitting and attempting to get out of bed without assistance. Mitts were applied and the patient attempted to bite mitts off. MD was notified of above behaviors, orders were put in for bilateral leg and wrist restraints. Restraints were applied, see documentation.

## 2020-11-04 NOTE — ED Notes (Signed)
Cardiac, bp, and pulse ox monitoring re applied. Brief changed. Patietn resting. Will continue to monitor.

## 2020-11-04 NOTE — Progress Notes (Signed)
Patient admitted to 5W from ED. Patient is alert and oriented to self. Vital signs are stable but HR remains elevated around 110 because the patient is constantly fidgeting and restless. He is on 10L HFNC and 15L NRB. Has no complaints of pain. Skin is intact, no signs of skin breakdown noted on exam. Patient belongings at bedside (clothing). The patient was shown how to use the call bell. Call bell, phone and bedside table are within reach; bed is in the lowest position.

## 2020-11-04 NOTE — ED Notes (Signed)
SDU Breakfast Ordered 

## 2020-11-05 ENCOUNTER — Inpatient Hospital Stay (HOSPITAL_COMMUNITY): Payer: Medicaid Other

## 2020-11-05 DIAGNOSIS — R4182 Altered mental status, unspecified: Secondary | ICD-10-CM | POA: Diagnosis not present

## 2020-11-05 DIAGNOSIS — U071 COVID-19: Secondary | ICD-10-CM | POA: Diagnosis not present

## 2020-11-05 LAB — POCT I-STAT 7, (LYTES, BLD GAS, ICA,H+H)
Acid-Base Excess: 2 mmol/L (ref 0.0–2.0)
Bicarbonate: 26.1 mmol/L (ref 20.0–28.0)
Calcium, Ion: 1.12 mmol/L — ABNORMAL LOW (ref 1.15–1.40)
HCT: 39 % (ref 39.0–52.0)
Hemoglobin: 13.3 g/dL (ref 13.0–17.0)
O2 Saturation: 97 %
Patient temperature: 100.7
Potassium: 4.3 mmol/L (ref 3.5–5.1)
Sodium: 148 mmol/L — ABNORMAL HIGH (ref 135–145)
TCO2: 27 mmol/L (ref 22–32)
pCO2 arterial: 42.4 mmHg (ref 32.0–48.0)
pH, Arterial: 7.403 (ref 7.350–7.450)
pO2, Arterial: 96 mmHg (ref 83.0–108.0)

## 2020-11-05 LAB — CBC
HCT: 44.6 % (ref 39.0–52.0)
Hemoglobin: 14.9 g/dL (ref 13.0–17.0)
MCH: 28.7 pg (ref 26.0–34.0)
MCHC: 33.4 g/dL (ref 30.0–36.0)
MCV: 85.9 fL (ref 80.0–100.0)
Platelets: 210 10*3/uL (ref 150–400)
RBC: 5.19 MIL/uL (ref 4.22–5.81)
RDW: 13.7 % (ref 11.5–15.5)
WBC: 16.6 10*3/uL — ABNORMAL HIGH (ref 4.0–10.5)
nRBC: 0 % (ref 0.0–0.2)

## 2020-11-05 LAB — GLUCOSE, CAPILLARY
Glucose-Capillary: 111 mg/dL — ABNORMAL HIGH (ref 70–99)
Glucose-Capillary: 120 mg/dL — ABNORMAL HIGH (ref 70–99)
Glucose-Capillary: 163 mg/dL — ABNORMAL HIGH (ref 70–99)
Glucose-Capillary: 165 mg/dL — ABNORMAL HIGH (ref 70–99)
Glucose-Capillary: 169 mg/dL — ABNORMAL HIGH (ref 70–99)

## 2020-11-05 LAB — COMPREHENSIVE METABOLIC PANEL
ALT: 153 U/L — ABNORMAL HIGH (ref 0–44)
AST: 370 U/L — ABNORMAL HIGH (ref 15–41)
Albumin: 3.3 g/dL — ABNORMAL LOW (ref 3.5–5.0)
Alkaline Phosphatase: 67 U/L (ref 38–126)
Anion gap: 14 (ref 5–15)
BUN: 26 mg/dL — ABNORMAL HIGH (ref 8–23)
CO2: 24 mmol/L (ref 22–32)
Calcium: 8.8 mg/dL — ABNORMAL LOW (ref 8.9–10.3)
Chloride: 108 mmol/L (ref 98–111)
Creatinine, Ser: 1.31 mg/dL — ABNORMAL HIGH (ref 0.61–1.24)
GFR, Estimated: >60 mL/min (ref >60)
Glucose, Bld: 106 mg/dL — ABNORMAL HIGH (ref 70–99)
Potassium: 4.2 mmol/L (ref 3.5–5.1)
Sodium: 146 mmol/L — ABNORMAL HIGH (ref 135–145)
Total Bilirubin: 1 mg/dL (ref 0.3–1.2)
Total Protein: 6.5 g/dL (ref 6.5–8.1)

## 2020-11-05 LAB — BRAIN NATRIURETIC PEPTIDE: B Natriuretic Peptide: 82 pg/mL (ref 0.0–100.0)

## 2020-11-05 LAB — PROCALCITONIN: Procalcitonin: 0.66 ng/mL

## 2020-11-05 LAB — C-REACTIVE PROTEIN: CRP: 6.6 mg/dL — ABNORMAL HIGH (ref <1.0)

## 2020-11-05 LAB — MAGNESIUM: Magnesium: 2.1 mg/dL (ref 1.7–2.4)

## 2020-11-05 LAB — D-DIMER, QUANTITATIVE: D-Dimer, Quant: 1.46 ug/mL-FEU — ABNORMAL HIGH (ref 0.00–0.50)

## 2020-11-05 MED ORDER — ASPIRIN 81 MG PO CHEW
81.0000 mg | CHEWABLE_TABLET | Freq: Every day | ORAL | Status: DC
  Administered 2020-11-06 – 2020-11-12 (×7): 81 mg
  Filled 2020-11-05 (×7): qty 1

## 2020-11-05 MED ORDER — MIDAZOLAM HCL 2 MG/2ML IJ SOLN
INTRAMUSCULAR | Status: AC
  Administered 2020-11-05: 2 mg via INTRAVENOUS
  Filled 2020-11-05: qty 4

## 2020-11-05 MED ORDER — BUSPIRONE HCL 15 MG PO TABS
15.0000 mg | ORAL_TABLET | Freq: Every day | ORAL | Status: DC
  Administered 2020-11-06 – 2020-11-12 (×7): 15 mg
  Filled 2020-11-05 (×7): qty 1

## 2020-11-05 MED ORDER — MIDAZOLAM HCL 2 MG/2ML IJ SOLN: 4.0000 mg | Freq: Once | INTRAMUSCULAR | Status: AC

## 2020-11-05 MED ORDER — RISPERIDONE 1 MG/ML PO SOLN
2.0000 mg | Freq: Three times a day (TID) | ORAL | Status: DC
  Administered 2020-11-05 – 2020-11-12 (×22): 2 mg
  Filled 2020-11-05 (×24): qty 2

## 2020-11-05 MED ORDER — BENZTROPINE MESYLATE 2 MG PO TABS
2.0000 mg | ORAL_TABLET | Freq: Every day | ORAL | Status: DC
  Administered 2020-11-06 – 2020-11-12 (×7): 2 mg
  Filled 2020-11-05 (×7): qty 1

## 2020-11-05 MED ORDER — CLOPIDOGREL BISULFATE 75 MG PO TABS
75.0000 mg | ORAL_TABLET | Freq: Every day | ORAL | Status: DC
  Administered 2020-11-06 – 2020-11-11 (×6): 75 mg
  Filled 2020-11-05 (×6): qty 1

## 2020-11-05 MED ORDER — TAMSULOSIN HCL 0.4 MG PO CAPS
0.4000 mg | ORAL_CAPSULE | Freq: Every day | ORAL | Status: DC
  Administered 2020-11-06: 0.4 mg via ORAL
  Filled 2020-11-05: qty 1

## 2020-11-05 MED ORDER — MIDAZOLAM HCL 2 MG/2ML IJ SOLN: 2.0000 mg | INTRAMUSCULAR | Status: DC | PRN

## 2020-11-05 MED ORDER — BENZTROPINE MESYLATE 1 MG PO TABS
1.0000 mg | ORAL_TABLET | Freq: Every day | ORAL | Status: DC
  Administered 2020-11-05 – 2020-11-12 (×8): 1 mg
  Filled 2020-11-05 (×8): qty 1

## 2020-11-05 MED ORDER — ACETAMINOPHEN 160 MG/5ML PO SOLN
650.0000 mg | Freq: Four times a day (QID) | ORAL | Status: DC | PRN
  Administered 2020-11-09: 650 mg
  Filled 2020-11-05: qty 20.3

## 2020-11-05 MED ORDER — ACETAMINOPHEN 650 MG RE SUPP: 650.0000 mg | Freq: Four times a day (QID) | RECTAL | Status: DC | PRN

## 2020-11-05 MED ORDER — SODIUM CHLORIDE 0.9 % IV SOLN: INTRAVENOUS | Status: DC

## 2020-11-05 MED ORDER — BUSPIRONE HCL 15 MG PO TABS: 15.0000 mg | ORAL_TABLET | Freq: Every day | ORAL | Status: DC

## 2020-11-05 MED ORDER — ROCURONIUM BROMIDE 10 MG/ML (PF) SYRINGE
PREFILLED_SYRINGE | INTRAVENOUS | Status: AC
  Filled 2020-11-05: qty 10

## 2020-11-05 MED ORDER — RISPERIDONE 1 MG/ML PO SOLN
2.0000 mg | Freq: Three times a day (TID) | ORAL | Status: DC
  Administered 2020-11-05: 2 mg via ORAL
  Filled 2020-11-05 (×3): qty 2

## 2020-11-05 MED ORDER — HYDROMORPHONE HCL 1 MG/ML IJ SOLN
INTRAMUSCULAR | Status: AC
  Administered 2020-11-05: 2 mg via INTRAVENOUS
  Filled 2020-11-05: qty 1

## 2020-11-05 MED ORDER — PHENYLEPHRINE 40 MCG/ML (10ML) SYRINGE FOR IV PUSH (FOR BLOOD PRESSURE SUPPORT)
PREFILLED_SYRINGE | INTRAVENOUS | Status: AC
  Administered 2020-11-05: 400 ug
  Filled 2020-11-05: qty 10

## 2020-11-05 MED ORDER — DEXTROSE 5 % IV SOLN: INTRAVENOUS | Status: DC

## 2020-11-05 MED ORDER — ALPRAZOLAM 0.5 MG PO TABS: 0.5000 mg | ORAL_TABLET | Freq: Two times a day (BID) | ORAL | Status: DC | PRN

## 2020-11-05 MED ORDER — NOREPINEPHRINE 4 MG/250ML-% IV SOLN
0.0000 ug/min | INTRAVENOUS | Status: DC
  Administered 2020-11-05: 2 ug/min via INTRAVENOUS
  Administered 2020-11-08: 3 ug/min via INTRAVENOUS
  Administered 2020-11-10: 17:00:00 2 ug/min via INTRAVENOUS
  Administered 2020-11-11 (×2): 10 ug/min via INTRAVENOUS
  Administered 2020-11-11: 03:00:00 8 ug/min via INTRAVENOUS
  Administered 2020-11-12: 21:00:00 40 ug/min via INTRAVENOUS
  Administered 2020-11-12: 08:00:00 11 ug/min via INTRAVENOUS
  Administered 2020-11-12: 01:00:00 10 ug/min via INTRAVENOUS
  Administered 2020-11-12: 15:00:00 11 ug/min via INTRAVENOUS
  Filled 2020-11-05 (×11): qty 250

## 2020-11-05 MED ORDER — ATORVASTATIN CALCIUM 40 MG PO TABS
40.0000 mg | ORAL_TABLET | Freq: Every day | ORAL | Status: DC
  Administered 2020-11-05 – 2020-11-12 (×8): 40 mg
  Filled 2020-11-05 (×8): qty 1

## 2020-11-05 MED ORDER — PANTOPRAZOLE SODIUM 40 MG PO PACK
40.0000 mg | PACK | Freq: Every day | ORAL | Status: DC
  Administered 2020-11-06 – 2020-11-12 (×7): 40 mg
  Filled 2020-11-05 (×7): qty 20

## 2020-11-05 MED ORDER — SODIUM CHLORIDE 0.9 % IV SOLN
0.5000 mg/h | INTRAVENOUS | Status: DC
  Administered 2020-11-05: 1 mg/h via INTRAVENOUS
  Filled 2020-11-05: qty 5

## 2020-11-05 MED ORDER — DEXMEDETOMIDINE HCL IN NACL 400 MCG/100ML IV SOLN
0.2000 ug/kg/h | INTRAVENOUS | Status: DC
  Administered 2020-11-05 (×2): 0.4 ug/kg/h via INTRAVENOUS
  Filled 2020-11-05 (×2): qty 100

## 2020-11-05 MED ORDER — ETOMIDATE 2 MG/ML IV SOLN
INTRAVENOUS | Status: AC
  Administered 2020-11-05: 20 mg via INTRAVENOUS
  Filled 2020-11-05: qty 20

## 2020-11-05 MED ORDER — HALOPERIDOL LACTATE 5 MG/ML IJ SOLN
3.0000 mg | Freq: Four times a day (QID) | INTRAMUSCULAR | Status: DC | PRN
  Filled 2020-11-05: qty 1

## 2020-11-05 MED ORDER — HYDROMORPHONE HCL 1 MG/ML IJ SOLN
INTRAMUSCULAR | Status: AC
  Filled 2020-11-05: qty 1

## 2020-11-05 MED ORDER — SODIUM CHLORIDE 0.9 % IV SOLN
3.0000 g | Freq: Four times a day (QID) | INTRAVENOUS | Status: AC
  Administered 2020-11-05 – 2020-11-12 (×28): 3 g via INTRAVENOUS
  Filled 2020-11-05 (×30): qty 8

## 2020-11-05 MED ORDER — CHLORHEXIDINE GLUCONATE CLOTH 2 % EX PADS
6.0000 | MEDICATED_PAD | Freq: Every day | CUTANEOUS | Status: DC
  Administered 2020-11-05 – 2020-11-12 (×8): 6 via TOPICAL

## 2020-11-05 MED ORDER — FENTANYL CITRATE (PF) 100 MCG/2ML IJ SOLN
INTRAMUSCULAR | Status: AC
  Filled 2020-11-05: qty 2

## 2020-11-05 MED ORDER — QUETIAPINE FUMARATE 100 MG PO TABS
100.0000 mg | ORAL_TABLET | Freq: Two times a day (BID) | ORAL | Status: DC
  Administered 2020-11-05 – 2020-11-06 (×3): 100 mg
  Filled 2020-11-05 (×3): qty 1

## 2020-11-05 MED ORDER — HYDROMORPHONE HCL 1 MG/ML IJ SOLN: 2.0000 mg | Freq: Once | INTRAMUSCULAR | Status: AC

## 2020-11-05 MED ORDER — RISPERIDONE 1 MG/ML PO SOLN: 2.0000 mg | Freq: Three times a day (TID) | ORAL | Status: DC

## 2020-11-05 MED ORDER — ETOMIDATE 2 MG/ML IV SOLN: 40.0000 mg | Freq: Once | INTRAVENOUS | Status: AC

## 2020-11-05 NOTE — Procedures (Signed)
Intubation Procedure Note  Billy Simon  [id number]  19-Nov-1956  Date:11/05/20  Time:12:58 PM   Provider Performing:Sueellen Kayes Gaynell Face    Procedure: Intubation (31500)  Indication(s) Respiratory Failure  Consent Risks of the procedure as well as the alternatives and risks of each were explained to the patient and/or caregiver.  Consent for the procedure was obtained and is signed in the bedside chart   Anesthesia Etomidate, Versed and dilaudid   Time Out Verified patient identification, verified procedure, site/side was marked, verified correct patient position, special equipment/implants available, medications/allergies/relevant history reviewed, required imaging and test results available.   Sterile Technique Usual hand hygeine, masks, and gloves were used   Procedure Description Patient positioned in bed supine.  Sedation given as noted above.  Patient was intubated with endotracheal tube using Glidescope.  View was Grade 1 full glottis .  Number of attempts was 1.  Colorimetric CO2 detector was consistent with tracheal placement.   Complications/Tolerance None; patient tolerated the procedure well. Chest X-ray is ordered to verify placement. sats remained in 90's thru entirety of procedure.    EBL none   Specimen(s) None

## 2020-11-05 NOTE — Progress Notes (Signed)
PT Cancellation Note  Patient Details Name: Billy Simon MRN: [id number] DOB: [birth year]   Cancelled Treatment:    Reason Eval/Treat Not Completed: Medical issues which prohibited therapy.  Pt transferred to ICU from 5W.  PT to hold today and check pt's status tomorrow.  Thanks,  Corinna Capra, PT, DPT  Acute Rehabilitation [phone] pager #[phone] office       [name] 11/05/2020, 12:29 PM

## 2020-11-05 NOTE — Progress Notes (Addendum)
PROGRESS NOTE                                                                                                                                                                                                             Patient Demographics:    Billy Simon, is a 64 y.o. male, DOB - 1956-05-12, ZOX:[id number]  Outpatient Primary MD for the patient is Galvin ProfferHague, Imran P, MD    LOS - 3  Admit date - 11/25/2020    Chief Complaint  Patient presents with  . Covid Confused with  fever       Brief Narrative (HPI from H&P)  Billy Simon is a 64 y.o. male with history of schizoaffective disorder, complete heart block status post pacemaker placement who was recently diagnosed with COVID-19 infection on October 31, 2020 was found to be increasingly confused since last evening at home, in the ER he was found to be encephalopathic, he had evidence of Covid pneumonia on chest x-ray, he also had urinary retention and was admitted to the hospital for further care.   Subjective:    Patient in bed, extremely confused today, much worse overnight,  unable to answer Qs. .   Assessment  & Plan :     1. Acute Covid 19 Viral Pneumonitis during the ongoing 2020 Covid 19 Pandemic - he is unfortunately unvaccinated, his chest x-ray evidence of disease, CRP is borderline, he is on steroids and Remdesivir, I think his COVID-19 pneumonia is actually stable or improving, main issue now is severe COVID-19 infection related encephalopathy with aspiration pneumonia on top of with viral pneumonia.  Encouraged the patient to sit up in chair in the daytime use I-S and flutter valve for pulmonary toiletry and then prone in bed when at night.  Will advance activity and titrate down oxygen as possible.  SpO2: 91 % O2 Flow Rate (L/min): 15 L/min (15L NRB)  Recent Labs  Lab 11/01/2020 0234 11/01/2020 0400 11/20/2020 0429 11/19/2020 0727 11/03/20 0606  11/04/20 0758 11/04/20 1513 11/05/20 0451  WBC 5.3  --   --  4.9 5.3 7.2  --  16.6*  HGB 14.2  --   --  14.1 14.0 15.0  --  14.9  HCT 43.3  --   --  44.2 43.6 45.8  --  44.6  PLT 152  --   --  143* 159 183  --  210  CRP  --  3.7*  --   --  3.2* 4.6*  --  6.6*  BNP  --   --   --   --  49.2  --  54.9 82.0  DDIMER  --  1.20*  --   --  1.14* 1.19*  --  1.46*  PROCALCITON  --  <0.10  --   --   --   --  <0.10 0.66  AST 84* 83*  --   --  121* 223*  --  370*  ALT 65* 65*  --   --  90* 159*  --  153*  ALKPHOS 50 48  --   --  52 55  --  67  BILITOT 0.5 0.5  --   --  0.7 1.4*  --  1.0  ALBUMIN 3.4* 3.3*  --   --  3.3* 3.3*  --  3.3*  SARSCOV2NAA  --   --  POSITIVE*  --   --   --   --   --      2.  Severe Covid viral infection related encephalopathy.  CT head nonacute, encephalopathy has been present for at least 3 days prior to admission per wife at home.  As needed Haldol, scheduled Seroquel along with home medications, unfortunately none of the oral medications can be given due to his extreme agitation and confusion, last admission few months ago he had similar encephalopathy requiring initially Precedex drip and then intubation, he is extremely agitated and I think he is at very high risk for large aspiration, will require Precedex drip again will transfer to ICU. EEG pending, note he denied headache when he was lucid.  3. Clinical aspiration pneumonia on 11/05/2020.  Likely has aspirated his oral secretions, Unasyn, not taking anything orally due to severe confusion, SLP on board. NPO except Meds for now.  4.  History of complete heart block with pacemaker placement.  5.  Dehydration, AKI with hypernatremia.  D5W.   6. Schizoaffective disorders.  Home meds along with scheduled Seroquel and as needed Haldol, however unable to take any oral medications at this time due to severe agitation.  7.  Dyslipidemia.  On home dose statin  If tolerates PO.  8.  Urinary retention with BPH.  On Flomax,  straight cath done on 11/03/2020 in the ER.  Will monitor bladder scan every shift.  If required will place Foley.     Condition - Extremely Guarded  Family Communication  :  Wife [name] [phone] on 11/03/20, message left 11/04/2020 at 8:11 AM, 11/05/20  Code Status :  Full  Consults  : PCCM  Procedures  :    CT Head - Non acute  PUD Prophylaxis : PPI  Disposition Plan  :    Status is: Inpatient  Remains inpatient appropriate because:IV treatments appropriate due to intensity of illness or inability to take PO   Dispo: The patient is from: Home              Anticipated d/c is to: Home              Anticipated d/c date is: > 3 days              Patient currently is not medically stable to d/c.   DVT Prophylaxis  :  Lovenox    Lab Results  Component Value Date   PLT 210 11/05/2020    Diet :  Diet  Order            Diet NPO time specified  Diet effective now                  Inpatient Medications  Scheduled Meds: . aspirin EC  81 mg Oral Daily  . atorvastatin  40 mg Oral QHS  . benztropine  2 mg Oral Daily   And  . benztropine  1 mg Oral QHS  . busPIRone  15 mg Oral Daily  . clopidogrel  75 mg Oral Daily  . dexamethasone (DECADRON) injection  6 mg Intravenous Q24H  . enoxaparin (LOVENOX) injection  40 mg Subcutaneous Daily  . pantoprazole  40 mg Oral Daily  . QUEtiapine  100 mg Oral BID  . risperiDONE  2 mg Oral TID  . tamsulosin  0.4 mg Oral Daily   Continuous Infusions: . ampicillin-sulbactam (UNASYN) IV 3 g (11/05/20 0944)  . dexmedetomidine (PRECEDEX) IV infusion    . dextrose 75 mL/hr at 11/05/20 0829  . remdesivir 100 mg in NS 100 mL 100 mg (11/05/20 0836)   PRN Meds:.acetaminophen **OR** acetaminophen, albuterol, ALPRAZolam, haloperidol lactate  Antibiotics  :    Anti-infectives (From admission, onward)   Start     Dose/Rate Route Frequency Ordered Stop   11/05/20 1000  remdesivir 100 mg in sodium chloride 0.9 % 100 mL IVPB        "Followed by" Linked Group Details   100 mg 200 mL/hr over 30 Minutes Intravenous Daily 11/04/20 0835 11/09/20 0959   11/05/20 0915  Ampicillin-Sulbactam (UNASYN) 3 g in sodium chloride 0.9 % 100 mL IVPB        3 g 200 mL/hr over 30 Minutes Intravenous Every 6 hours 11/05/20 0819     11/04/20 1000  remdesivir 100 mg in sodium chloride 0.9 % 100 mL IVPB  Status:  Discontinued       "Followed by" Linked Group Details   100 mg 200 mL/hr over 30 Minutes Intravenous Daily 11/03/20 1120 11/04/20 0835   11/03/20 1130  remdesivir 200 mg in sodium chloride 0.9% 250 mL IVPB       "Followed by" Linked Group Details   200 mg 580 mL/hr over 30 Minutes Intravenous Once 11/03/20 1120 11/04/20 0925       Time Spent in minutes  30   Susa Raring M.D on 11/05/2020 at 10:23 AM  To page go to [email or web address] - password [facility]  [facility] -  Office  [phone]   See all Orders from today for further details    Objective:   Vitals:   11/05/20 0350 11/05/20 0738 11/05/20 0844 11/05/20 0952  BP: 126/83 133/72 (!) 124/94 132/74  Pulse: (!) 110 (!) 110 (!) 112 (!) 113  Resp: (!) 27 (!) 26 (!) 26 (!) 29  Temp: 99.6 F (37.6 C) (!) 100.9 F (38.3 C) (!) 100.6 F (38.1 C) (!) 100.7 F (38.2 C)  TempSrc: Axillary Axillary Axillary Axillary  SpO2: 92% 92% 93% 91%  Weight:      Height:        Wt Readings from Last 3 Encounters:  11/14/2020 105 kg  01/02/20 97.3 kg  10/11/18 84.6 kg     Intake/Output Summary (Last 24 hours) at 11/05/2020 1023 Last data filed at 11/05/2020 8299 Gross per 24 hour  Intake 0 ml  Output 700 ml  Net -700 ml     Physical Exam  Awake and extremely confused much worse overnight, moving all 4 extremities and  under restraints, [place].AT,PERRAL Supple Neck,No JVD, No cervical lymphadenopathy appriciated.  Symmetrical Chest wall movement, Good air movement bilaterally, CTAB RRR,No Gallops, Rubs or new Murmurs, No Parasternal Heave +ve B.Sounds, Abd Soft, No  tenderness, No organomegaly appriciated, No rebound - guarding or rigidity. No Cyanosis, Clubbing or edema, No new Rash or bruise     Data Review:    CBC Recent Labs  Lab 11/18/2020 0234 11/30/2020 0727 11/03/20 0606 11/04/20 0758 11/05/20 0451  WBC 5.3 4.9 5.3 7.2 16.6*  HGB 14.2 14.1 14.0 15.0 14.9  HCT 43.3 44.2 43.6 45.8 44.6  PLT 152 143* 159 183 210  MCV 87.1 88.9 88.8 87.2 85.9  MCH 28.6 28.4 28.5 28.6 28.7  MCHC 32.8 31.9 32.1 32.8 33.4  RDW 14.1 14.2 14.1 14.0 13.7  LYMPHSABS 0.4* 0.8  --   --   --   MONOABS 0.4 0.5  --   --   --   EOSABS 0.0 0.0  --   --   --   BASOSABS 0.0 0.0  --   --   --     Recent Labs  Lab 11/03/2020 0234 11/30/2020 0400 11/10/2020 0727 11/03/20 0606 11/04/20 0758 11/04/20 1513 11/05/20 0451  NA 137 141  --  141 144  --  146*  K 4.0 4.0  --  3.9 5.1  --  4.2  CL 104 105  --  105 107  --  108  CO2 21* 23  --  24 24  --  24  GLUCOSE 125* 105*  --  92 102*  --  106*  BUN 15 15  --  12 16  --  26*  CREATININE 1.31* 1.29*  --  1.09 0.96  --  1.31*  CALCIUM 8.5* 8.5*  --  8.2* 8.5*  --  8.8*  AST 84* 83*  --  121* 223*  --  370*  ALT 65* 65*  --  90* 159*  --  153*  ALKPHOS 50 48  --  52 55  --  67  BILITOT 0.5 0.5  --  0.7 1.4*  --  1.0  ALBUMIN 3.4* 3.3*  --  3.3* 3.3*  --  3.3*  MG  --   --   --  1.7 2.1  --  2.1  CRP  --  3.7*  --  3.2* 4.6*  --  6.6*  DDIMER  --  1.20*  --  1.14* 1.19*  --  1.46*  PROCALCITON  --  <0.10  --   --   --  <0.10 0.66  TSH  --  4.066  --   --   --   --   --   AMMONIA  --   --  33  --   --   --   --   BNP  --   --   --  49.2  --  54.9 82.0    ------------------------------------------------------------------------------------------------------------------ No results for input(s): CHOL, HDL, LDLCALC, TRIG, CHOLHDL, LDLDIRECT in the last 72 hours.  No results found for: HGBA1C ------------------------------------------------------------------------------------------------------------------ No results for  input(s): TSH, T4TOTAL, T3FREE, THYROIDAB in the last 72 hours.  Invalid input(s): FREET3  Cardiac Enzymes No results for input(s): CKMB, TROPONINI, MYOGLOBIN in the last 168 hours.  Invalid input(s): CK ------------------------------------------------------------------------------------------------------------------    Component Value Date/Time   BNP 82.0 11/05/2020 0451    Micro Results Recent Results (from the past 240 hour(s))  Resp Panel by RT-PCR (Flu A&B, Covid) Nasopharyngeal Swab  Status: Abnormal   Collection Time: 11/16/2020  4:29 AM   Specimen: Nasopharyngeal Swab; Nasopharyngeal(NP) swabs in vial transport medium  Result Value Ref Range Status   SARS Coronavirus 2 by RT PCR POSITIVE (A) NEGATIVE Final    Comment: RESULT CALLED TO, READ BACK BY AND VERIFIED WITH: A OAKLEY RN 11/19/2020 1610 JDW (NOTE) SARS-CoV-2 target nucleic acids are DETECTED.  The SARS-CoV-2 RNA is generally detectable in upper respiratory specimens during the acute phase of infection. Positive results are indicative of the presence of the identified virus, but do not rule out bacterial infection or co-infection with other pathogens not detected by the test. Clinical correlation with patient history and other diagnostic information is necessary to determine patient infection status. The expected result is Negative.  Fact Sheet for Patients: BloggerCourse.com  Fact Sheet for Healthcare Providers: SeriousBroker.it  This test is not yet approved or cleared by the [place] FDA and  has been authorized for detection and/or diagnosis of SARS-CoV-2 by FDA under an Emergency Use Authorization (EUA).  This EUA will remain in effect (meaning this test can be used ) for the duration of  the COVID-19 declaration under Section 564(b)(1) of the Act, 21 U.S.C. section 360bbb-3(b)(1), unless the authorization is terminated or revoked sooner.      Influenza A by PCR NEGATIVE NEGATIVE Final   Influenza B by PCR NEGATIVE NEGATIVE Final    Comment: (NOTE) The Xpert Xpress SARS-CoV-2/FLU/RSV plus assay is intended as an aid in the diagnosis of influenza from Nasopharyngeal swab specimens and should not be used as a sole basis for treatment. Nasal washings and aspirates are unacceptable for Xpert Xpress SARS-CoV-2/FLU/RSV testing.  Fact Sheet for Patients: BloggerCourse.com  Fact Sheet for Healthcare Providers: SeriousBroker.it  This test is not yet approved or cleared by the [place] FDA and has been authorized for detection and/or diagnosis of SARS-CoV-2 by FDA under an Emergency Use Authorization (EUA). This EUA will remain in effect (meaning this test can be used) for the duration of the COVID-19 declaration under Section 564(b)(1) of the Act, 21 U.S.C. section 360bbb-3(b)(1), unless the authorization is terminated or revoked.  Performed at [facility], [address], [place], [place] [address]     Radiology Reports CT HEAD WO CONTRAST  Result Date: 11/26/2020 CLINICAL DATA:  Altered mental status EXAM: CT HEAD WITHOUT CONTRAST TECHNIQUE: Contiguous axial images were obtained from the base of the skull through the vertex without intravenous contrast. COMPARISON:  None. FINDINGS: Brain: Normal anatomic configuration. Mild parenchymal volume loss is commensurate with the patient's age. No abnormal intra or extra-axial mass lesion or fluid collection. No abnormal mass effect or midline shift. No evidence of acute intracranial hemorrhage or infarct. Ventricular size is normal. Cerebellum unremarkable. Vascular: Unremarkable Skull: Intact Sinuses/Orbits: There is moderate mucosal thickening within the right maxillary sinus and milder mucosal thickening noted within the right sphenoid sinus, frontal sinuses, and several ethmoid air cells bilaterally. No air-fluid levels.  Remaining paranasal sinuses are clear. Orbits are unremarkable. Other: Mastoid air cells and middle ear cavities are clear. IMPRESSION: No acute intracranial abnormality. Mild paranasal sinus disease. Electronically Signed   By: Helyn Numbers MD   On: 11/17/2020 06:26   DG Chest Port 1 View  Result Date: 11/05/2020 CLINICAL DATA:  COVID patient with fever and confusion. EXAM: PORTABLE CHEST 1 VIEW COMPARISON:  11/03/2020 chest radiograph and prior. FINDINGS: Left predominant patchy perihilar/basilar opacities. Improved right lung aeration. No pneumothorax or pleural effusion. Partially obscured cardiomediastinal  silhouette. Left chest pacing device. IMPRESSION: Bilateral pulmonary opacities with improved right lung aeration. Electronically Signed   By: Stana Bunting M.D.   On: 11/05/2020 08:47   DG Chest Port 1 View  Result Date: 11/03/2020 CLINICAL DATA:  Shortness of breath, COVID-19 positivity EXAM: PORTABLE CHEST 1 VIEW COMPARISON:  11/01/2020 FINDINGS: Cardiac shadow is enlarged. Pacing device is again seen. Patchy right upper lobe infiltrate is noted along the minor fissure. Basilar opacity is noted on the left as well. This has increased somewhat in the interval from the prior exam. No sizable effusion is seen. No bony abnormality is noted. IMPRESSION: Increase in patchy airspace opacities bilaterally consistent with the given clinical history. Electronically Signed   By: Alcide Clever M.D.   On: 11/03/2020 08:32   DG Chest Portable 1 View  Result Date: 11/01/2020 CLINICAL DATA:  COVID-19 positivity with fevers EXAM: PORTABLE CHEST 1 VIEW COMPARISON:  09/15/2019 FINDINGS: Cardiac shadow is stable. Pacing device is again seen. Patchy opacity is noted in the left base consistent with the given clinical history. No sizable effusion is noted. No bony abnormality is seen. IMPRESSION: Patchy airspace opacity in the left base consistent with the given clinical history of COVID-19 positivity.  Electronically Signed   By: Alcide Clever M.D.   On: 11/04/2020 03:01

## 2020-11-05 NOTE — Progress Notes (Signed)
   11/05/20 0738  Assess: MEWS Score  Temp (!) 100.9 F (38.3 C)  BP 133/72  Pulse Rate (!) 110  ECG Heart Rate (!) 110  Resp (!) 26  Level of Consciousness Alert  SpO2 92 %  O2 Device HFNC;Non-rebreather Mask  O2 Flow Rate (L/min) 15 L/min (15L NRB)  Assess: MEWS Score  MEWS Temp 1  MEWS Systolic 0  MEWS Pulse 1  MEWS RR 2  MEWS LOC 0  MEWS Score 4  MEWS Score Color Red  Assess: if the MEWS score is Yellow or Red  Were vital signs taken at a resting state? No  Focused Assessment No change from prior assessment  Early Detection of Sepsis Score *See Row Information* Medium  MEWS guidelines implemented *See Row Information* Yes  Treat  MEWS Interventions Administered prn meds/treatments (tylenol for fever)  Pain Scale Faces  Faces Pain Scale 4  Take Vital Signs  Increase Vital Sign Frequency  Red: Q 1hr X 4 then Q 4hr X 4, if remains red, continue Q 4hrs  Escalate  MEWS: Escalate Red: discuss with charge nurse/RN and provider, consider discussing with RRT  Notify: Charge Nurse/RN  Name of Charge Nurse/RN Notified Elisa RN  Date Charge Nurse/RN Notified 11/05/20  Time Charge Nurse/RN Notified [id number]  Notify: Provider  Provider Name/Title Dr. Thedore Mins  Date Provider Notified 11/05/20  Time Provider Notified [phone]  Notification Type Face-to-face  Notification Reason Other (Comment)  Response No new orders  Date of Provider Response 11/05/20  Time of Provider Response [phone]  Document  Patient Outcome Not stable and remains on department  Progress note created (see row info) Yes

## 2020-11-05 NOTE — Progress Notes (Signed)
EEG complete - results pending 

## 2020-11-05 NOTE — Progress Notes (Signed)
Pharmacy Antibiotic Note  Mcarthur Ivins is a 64 y.o. male admitted on 11/11/2020 with aspiration PNA.  Pharmacy has been consulted for Unasyn dosing.  Plan: Unasyn 3gm IV q6h Monitor for clinical course, LOT and deescalation.   Height: 5\' 9"  (175.3 cm) Weight: 105 kg (231 lb 7.7 oz) IBW/kg (Calculated) : 70.7  Temp (24hrs), Avg:99.4 F (37.4 C), Min:97.3 F (36.3 C), Max:100.9 F (38.3 C)  Recent Labs  Lab 10/31/2020 0234 11/01/2020 0400 11/09/2020 0727 11/03/20 0606 11/04/20 0758 11/05/20 0451  WBC 5.3  --  4.9 5.3 7.2 16.6*  CREATININE 1.31* 1.29*  --  1.09 0.96 1.31*    Estimated Creatinine Clearance: 68 mL/min (A) (by C-G formula based on SCr of 1.31 mg/dL (H)).    Allergies  Allergen Reactions  . Duragesic-100 [Fentanyl] Other (See Comments)    Pt became violent    Antimicrobials this admission: 12/6 Unasyn >>   Oaklan Persons A. 14/6, PharmD, BCPS, FNKF Clinical Pharmacist [place] Please utilize Amion for appropriate phone number to reach the unit pharmacist [facility])     [facility] 11/05/2020 8:17 AM

## 2020-11-05 NOTE — Procedures (Signed)
Central Venous Catheter Insertion Procedure Note  Billy Simon  [id number]  1956-09-15  Date:11/05/20  Time:2:38 PM   Provider Performing:Dayson Aboud Gaynell Face   Procedure: Insertion of Non-tunneled Central Venous [phone]) with US guidance ([id number])   Indication(s) Medication administration  Consent Risks of the procedure as well as the alternatives and risks of each were explained to the patient and/or caregiver.  Consent for the procedure was obtained and is signed in the bedside chart  Anesthesia see mar  Timeout Verified patient identification, verified procedure, site/side was marked, verified correct patient position, special equipment/implants available, medications/allergies/relevant history reviewed, required imaging and test results available.  Sterile Technique Maximal sterile technique including full sterile barrier drape, hand hygiene, sterile gown, sterile gloves, mask, hair covering, sterile ultrasound probe cover (if used).  Procedure Description Area of catheter insertion was cleaned with chlorhexidine and draped in sterile fashion.  With real-time ultrasound guidance a central venous catheter was placed into the right internal jugular vein. Nonpulsatile blood flow and easy flushing noted in all ports.  The catheter was sutured in place and sterile dressing applied.  Complications/Tolerance None; patient tolerated the procedure well. Chest X-ray is ordered to verify placement for internal jugular or subclavian cannulation.   Chest x-ray is not ordered for femoral cannulation.  EBL minimal  Specimen(s) None   Line was done under u/s guidance. Easily compressible vein noted with neighboring pulsatile artery. Upon stick dark red non pulsatile blood was noted. Wire easily advanced. Wire was verified to be in easily compressible/non pulsatile vessel in both cross sectional and longitudinal views under ultrasound guidance. Skin was easily dilated and catheter  advanced without issue. Wire was withdrawn from vessel. No air was aspirated thru entirety of the procedure. Pt tolerated well. No complications were appreciated.

## 2020-11-05 NOTE — Consult Note (Signed)
NAME:  Billy Simon, MRN:  [id number], DOB:  [birth year], LOS: 3 ADMISSION DATE:  11/16/2020, CONSULTATION DATE:  12/6 REFERRING MD:  Dr Thedore MinsSingh, CHIEF COMPLAINT:  AMS    Brief History   64yo male presented with known diagnosis of COVID now with worsening confusion. PCCM consulted 12/6 for worsening confusion with concern for developing sepsis.   History of present illness   Billy Simon is a 64yo male with PMH significant for severe schizoaffective disorder, complete heart block S/P pacemaker, anxiety, depression, and syncope who presented with progressive confusion and weakness. Of note patient was recently diagnosed of COVID in the outpatient setting 12/1. Given CXR evidence of LLL pneumonia, mild AKI with elevated creatinine, elevated LFTs, and elevated D-Dimer of 1.2 patient was admitted to hospitalitis service for further management and care   On 12/6 PCCM was consulted due to worsening encephalopathy with clinical concern for aspiration pneumonia. Due to significant confusion patient has not had his baseline antipsychotic medications since admission. Patient was transferred to ICU for likely need of precedex and close monitoring of respiratory status  Past Medical History  Severe schizoaffective disorder, complete heart block S/P pacemaker, anxiety, depression, and syncope  Significant Hospital Events   Admitted 12/3  Consults:  PCCM  Procedures:    Significant Diagnostic Tests:   Head CT 12/3 > Negative   CXR 12/6 > Bilateral pulmonary opacities with improved right lung aeration.  Micro Data:  COVID 12/3 > positive  Blood culture 12/6 >  Antimicrobials:  Remdesivir 12/4 >  Interim history/subjective:  12/6: requiring intubation at this time for airway protection  Objective   Blood pressure 132/74, pulse (!) 113, temperature (!) 100.7 F (38.2 C), temperature source Axillary, resp. rate (!) 29, height 5\' 9"  (1.753 m), weight 105 kg, SpO2 91 %.         Intake/Output Summary (Last 24 hours) at 11/05/2020 1125 Last data filed at 11/05/2020 1054 Gross per 24 hour  Intake 0 ml  Output 850 ml  Net -850 ml   Filed Weights   11/07/2020 0224  Weight: 105 kg    Examination: General: Chronically ill appearing elderly male in acute distress with increased wob HEENT: ETT, MM pink/moist poor dentition, PERRL,  Neuro: poorly responsive, restless in bed CV: s1s2 paced and tachycardic, no murmur, rubs, or gallops,  PULM:  Diminished bilateral  GI: soft, bowel sounds active in all 4 quadrants, non-tender, non-distended Extremities: warm/dry Skin: no rashes or lesions   Resolved Hospital Problem list     Assessment & Plan:   COVID pneumonia  -LDH 386, Ferritin 326, CRP 4.6 > 6.6, Lactic 1.0, Procalcitonin 0.66 P: Pt with high oxygen demand nrb and 15L.  -minimally responsive on precedex -will move to intubate at this time.  -abg post intubation to determine proning -will change sedation, wife has stated she does not want him on fentanyl so will utilize dilaudid Continue remdesivir  VTE prophylaxis Trend lactate  Cautious IV hydration  Acute hypoxic/hypercapnic respiratory failure secondary to COVID pneumonia with concern for acute aspiration pneumonia  P: Continue respiratory support with vent at this time.  Encourage frequent pulmonary hygiene  Head of bed elevated 30 degrees. Follow intermittent chest x-ray and ABG Follow cultures  Low threshold to initiate antibiotics   Metabolic encephalopathy in the setting of acute COVID viral infection  -Patient presented with acute AMS that developed around the time he was diagnosed with COVID, since admission confusion has progressively worsened  -Head CT negative  HX of severe schizoaffective disorder  -Home medications include Xanax, Cogentin, Wellbutrin, Buspar, Celexa, and Risperadal,  P: Precedex drip for agitation with dilaudid infusion Place og for medication Resume home  medications when able  Close monitoring in the ICU setting  NPO EEG pending   Hx of complete heart block S/P pacemaker P: Continuous telemetry  Supportive care   Acute Kidney Injury  -in the setting of acute infection. Baseline creatinine 0.8 P: Follow renal function / urine output Trend Bmet Avoid nephrotoxins Ensure adequate renal perfusion  IV hydration  Urinary retention with HX of BPH  P: Resume home Flomax once enteral access is established  Daily bladder scan   Best practice (evaluated daily)  Diet: NPO  Pain/Anxiety/Delirium protocol (if indicated): PRNs VAP protocol (if indicated): N/A DVT prophylaxis: Lovenox  GI prophylaxis: PPI Glucose control: SSI Mobility: Bedrest  last date of multidisciplinary goals of care discussion: 12/6 Family and staff present: self and wife via phone Summary of discussion: full code with aggressive support Follow up goals of care discussion due:  In 5-7 days Code Status: Full Disposition: ICU   Labs   CBC: Recent Labs  Lab 12-02-2020 0234 2020-12-02 0727 11/03/20 0606 11/04/20 0758 11/05/20 0451  WBC 5.3 4.9 5.3 7.2 16.6*  NEUTROABS 4.5 3.6  --   --   --   HGB 14.2 14.1 14.0 15.0 14.9  HCT 43.3 44.2 43.6 45.8 44.6  MCV 87.1 88.9 88.8 87.2 85.9  PLT 152 143* 159 183 210    Basic Metabolic Panel: Recent Labs  Lab 12/02/20 0234 2020/12/02 0400 11/03/20 0606 11/04/20 0758 11/05/20 0451  NA 137 141 141 144 146*  K 4.0 4.0 3.9 5.1 4.2  CL 104 105 105 107 108  CO2 21* 23 24 24 24   GLUCOSE 125* 105* 92 102* 106*  BUN 15 15 12 16  26*  CREATININE 1.31* 1.29* 1.09 0.96 1.31*  CALCIUM 8.5* 8.5* 8.2* 8.5* 8.8*  MG  --   --  1.7 2.1 2.1   GFR: Estimated Creatinine Clearance: 68 mL/min (A) (by C-G formula based on SCr of 1.31 mg/dL (H)). Recent Labs  Lab 12-02-2020 0234 2020/12/02 0400 12-02-2020 0727 11/03/20 0606 11/04/20 0758 11/04/20 1513 11/05/20 0451  PROCALCITON  --  <0.10  --   --   --  <0.10 0.66  WBC   < >  --   4.9 5.3 7.2  --  16.6*   < > = values in this interval not displayed.    Liver Function Tests: Recent Labs  Lab Dec 02, 2020 0234 12-02-20 0400 11/03/20 0606 11/04/20 0758 11/05/20 0451  AST 84* 83* 121* 223* 370*  ALT 65* 65* 90* 159* 153*  ALKPHOS 50 48 52 55 67  BILITOT 0.5 0.5 0.7 1.4* 1.0  PROT 6.2* 6.1* 6.2* 6.4* 6.5  ALBUMIN 3.4* 3.3* 3.3* 3.3* 3.3*   No results for input(s): LIPASE, AMYLASE in the last 168 hours. Recent Labs  Lab 12/02/20 0727  AMMONIA 33    ABG    Component Value Date/Time   PHART 7.507 (H) 09/24/2018 0413   PCO2ART 37.1 09/24/2018 0413   PO2ART 85.0 09/24/2018 0413   HCO3 29.3 (H) 09/24/2018 0413   TCO2 30 09/24/2018 0413   ACIDBASEDEF 3.0 (H) 09/20/2018 0411   O2SAT 97.0 09/24/2018 0413     Coagulation Profile: No results for input(s): INR, PROTIME in the last 168 hours.  Cardiac Enzymes: No results for input(s): CKTOTAL, CKMB, CKMBINDEX, TROPONINI in the last 168 hours.  HbA1C: No results found for: HGBA1C  CBG: Recent Labs  Lab 11/04/20 0740 11/04/20 1808 11/04/20 2101 11/04/20 2348 11/05/20 0609  GLUCAP 99 108* 116* 112* 111*    Review of Systems:   unobtainable  Past Medical History  He,  has a past medical history of CHB (complete heart block) (HCC) (09/14/2018), COVID-19, and Syncope (08/2018).   Surgical History    Past Surgical History:  Procedure Laterality Date  . LEFT HEART CATH AND CORONARY ANGIOGRAPHY N/A 09/14/2018   Procedure: LEFT HEART CATH AND CORONARY ANGIOGRAPHY;  Surgeon: Swaziland, Peter M, MD;  [facility] INVASIVE CV LAB;  Service: Cardiovascular;  Laterality: N/A;  . PACEMAKER IMPLANT N/A 09/16/2018   Procedure: PACEMAKER IMPLANT;  Surgeon: Regan Lemming, MD;  Location: [facility] INVASIVE CV LAB;  Service: Cardiovascular;  Laterality: N/A;  . TEMPORARY PACEMAKER N/A 09/14/2018   Procedure: TEMPORARY PACEMAKER;  Surgeon: Swaziland, Peter M, MD;  Location: [facility] INVASIVE CV LAB;  Service: Cardiovascular;   Laterality: N/A;     Social History   reports that he has never smoked. He has never used smokeless tobacco. He reports that he does not drink alcohol.   Family History   His family history includes Heart Problems in his father; Heart attack in his father; Leukemia in his mother.   Allergies Allergies  Allergen Reactions  . Duragesic-100 [Fentanyl] Other (See Comments)    Pt became violent     Home Medications  Prior to Admission medications   Medication Sig Start Date End Date Taking? Authorizing Provider  ALPRAZolam Prudy Feeler) 0.5 MG tablet Take 1 tablet (0.5 mg total) by mouth 2 (two) times daily as needed for anxiety. 10/11/18  Yes Barnetta Chapel, MD  ALPRAZolam Prudy Feeler) 1 MG tablet Take 1 mg by mouth 2 (two) times daily as needed. 05/07/20  Yes [provider]  ascorbic acid (VITAMIN C) 500 MG tablet Take 500 mg by mouth 2 (two) times daily.   Yes [provider]  aspirin 81 MG EC tablet Take 81 mg by mouth daily.    Yes [provider]  atorvastatin (LIPITOR) 40 MG tablet Take 40 mg by mouth at bedtime. 09/06/20  Yes [provider]  benztropine (COGENTIN) 1 MG tablet Take 1 mg by mouth See admin instructions. 2 tabs in the morning 1 tab hs   Yes [provider]  buPROPion (WELLBUTRIN XL) 300 MG 24 hr tablet Take 300 mg by mouth daily.   Yes [provider]  busPIRone (BUSPAR) 15 MG tablet Take 1 tablet (15 mg total) by mouth daily. 10/12/18  Yes Berton Mount I, MD  Cholecalciferol (VITAMIN D3) 50 MCG (2000 UT) CAPS Take 1 capsule by mouth daily.   Yes [provider]  citalopram (CELEXA) 40 MG tablet Take 40 mg by mouth daily.   Yes [provider]  clopidogrel (PLAVIX) 75 MG tablet Take 75 mg by mouth daily. 09/30/20  Yes [provider]  meclizine (ANTIVERT) 25 MG tablet Take 25 mg by mouth 3 (three) times daily as needed for dizziness.   Yes [provider]  pantoprazole (PROTONIX) 40  MG tablet Take 40 mg by mouth daily. 05/08/19  Yes [provider]  risperiDONE (RISPERDAL) 1 MG tablet Take 1 mg by mouth 3 (three) times daily.  10/26/20  Yes [provider]  tamsulosin (FLOMAX) 0.4 MG CAPS capsule Take 0.4 mg by mouth daily. 10/11/20  Yes [provider]  Zinc 100 MG TABS Take 1 tablet by  mouth daily.   Yes [provider]  risperiDONE (RISPERDAL) 1 MG/ML oral solution Place 2 mLs (2 mg total) into feeding tube 3 (three) times daily. Patient not taking: Reported on 11/03/2020 10/11/18   Berton Mount I, MD     Critical care time: The patient is critically ill with multiple organ systems failure and requires high complexity decision making for assessment and support, frequent evaluation and titration of therapies, application of advanced monitoring technologies and extensive interpretation of multiple databases.  Critical care time 43 mins. This represents my time independent of the NPs time taking care of the pt. This is excluding procedures.    Briant Sites DO [facility] 11/05/2020, 12:32 PM

## 2020-11-05 NOTE — Progress Notes (Signed)
OT Cancellation Note  Patient Details Name: Billy Simon MRN: [id number] DOB: 1956-01-03   Cancelled Treatment:    Reason Eval/Treat Not Completed: Medical issues which prohibited therapy (transfer to ICU)  Thornell Mule, OT/L   Acute OT Clinical Specialist Acute Rehabilitation Services Pager [phone] Office [phone]  11/05/2020, 11:33 AM

## 2020-11-05 NOTE — Progress Notes (Signed)
Speech Language Pathology Discharge Patient Details Name: Billy Simon MRN: [id number] DOB: May 25, 1956 Today's Date: 11/05/2020 Time:  -     Patient discharged from SLP services secondary to medical decline - will need to re-order SLP to resume therapy services. Intubated.Please reconsult when appropriate.   Please see latest therapy progress note for current level of functioning and progress toward goals.    Progress and discharge plan discussed with patient and/or caregiver: Patient unable to participate in discharge planning and no caregivers available  GO     [name] 11/05/2020, 2:00 PM   [name].Ed Nurse, children's [phone] Office [phone]

## 2020-11-05 NOTE — Procedures (Signed)
Patient Name: Billy Simon  MRN: [id number]  Epilepsy Attending: Charlsie Quest  Referring Physician/Provider: Dr Midge Minium Date: 11/05/2020 Duration: 23.58 mins  Patient history: 64yo M with ams. EEG to evaluate for seizure  Level of alertness: Awake/ lethargic  AEDs during EEG study: None  Technical aspects: This EEG study was done with scalp electrodes positioned according to the 10-20 International system of electrode placement. Electrical activity was acquired at a sampling rate of 500Hz  and reviewed with a high frequency filter of 70Hz  and a low frequency filter of 1Hz . EEG data were recorded continuously and digitally stored.   Description: EEG showed continuous generalized 3 to 6 Hz theta-delta slowing, at times with triphasic morphology. Hyperventilation and photic stimulation were not performed.   ABNORMALITY -Continuous slow, generalized  IMPRESSION: This study is suggestive of severe diffuse encephalopathy, nonspecific etiology. No seizures or epileptiform discharges were seen throughout the recording.  [name] 

## 2020-11-06 ENCOUNTER — Inpatient Hospital Stay (HOSPITAL_COMMUNITY): Payer: Medicaid Other

## 2020-11-06 DIAGNOSIS — U071 COVID-19: Secondary | ICD-10-CM

## 2020-11-06 DIAGNOSIS — J9601 Acute respiratory failure with hypoxia: Secondary | ICD-10-CM

## 2020-11-06 LAB — MAGNESIUM
Magnesium: 2.3 mg/dL (ref 1.7–2.4)
Magnesium: 2.5 mg/dL — ABNORMAL HIGH (ref 1.7–2.4)
Magnesium: 2.5 mg/dL — ABNORMAL HIGH (ref 1.7–2.4)

## 2020-11-06 LAB — GLUCOSE, CAPILLARY
Glucose-Capillary: 163 mg/dL — ABNORMAL HIGH (ref 70–99)
Glucose-Capillary: 157 mg/dL — ABNORMAL HIGH (ref 70–99)
Glucose-Capillary: 138 mg/dL — ABNORMAL HIGH (ref 70–99)
Glucose-Capillary: 134 mg/dL — ABNORMAL HIGH (ref 70–99)
Glucose-Capillary: 116 mg/dL — ABNORMAL HIGH (ref 70–99)
Glucose-Capillary: 113 mg/dL — ABNORMAL HIGH (ref 70–99)

## 2020-11-06 LAB — COMPREHENSIVE METABOLIC PANEL
ALT: 116 U/L — ABNORMAL HIGH (ref 0–44)
AST: 215 U/L — ABNORMAL HIGH (ref 15–41)
Albumin: 2.6 g/dL — ABNORMAL LOW (ref 3.5–5.0)
Alkaline Phosphatase: 59 U/L (ref 38–126)
Anion gap: 10 (ref 5–15)
BUN: 29 mg/dL — ABNORMAL HIGH (ref 8–23)
CO2: 26 mmol/L (ref 22–32)
Calcium: 8 mg/dL — ABNORMAL LOW (ref 8.9–10.3)
Chloride: 107 mmol/L (ref 98–111)
Creatinine, Ser: 1.1 mg/dL (ref 0.61–1.24)
GFR, Estimated: >60 mL/min (ref >60)
Glucose, Bld: 163 mg/dL — ABNORMAL HIGH (ref 70–99)
Potassium: 4 mmol/L (ref 3.5–5.1)
Sodium: 143 mmol/L (ref 135–145)
Total Bilirubin: 1 mg/dL (ref 0.3–1.2)
Total Protein: 5.7 g/dL — ABNORMAL LOW (ref 6.5–8.1)

## 2020-11-06 LAB — BRAIN NATRIURETIC PEPTIDE: B Natriuretic Peptide: 94 pg/mL (ref 0.0–100.0)

## 2020-11-06 LAB — PHOSPHORUS
Phosphorus: 2.3 mg/dL — ABNORMAL LOW (ref 2.5–4.6)
Phosphorus: 2.6 mg/dL (ref 2.5–4.6)

## 2020-11-06 LAB — CBC
HCT: 39.7 % (ref 39.0–52.0)
Hemoglobin: 12.9 g/dL — ABNORMAL LOW (ref 13.0–17.0)
MCH: 28.4 pg (ref 26.0–34.0)
MCHC: 32.5 g/dL (ref 30.0–36.0)
MCV: 87.4 fL (ref 80.0–100.0)
Platelets: 131 10*3/uL — ABNORMAL LOW (ref 150–400)
RBC: 4.54 MIL/uL (ref 4.22–5.81)
RDW: 14 % (ref 11.5–15.5)
WBC: 12.1 10*3/uL — ABNORMAL HIGH (ref 4.0–10.5)
nRBC: 0 % (ref 0.0–0.2)

## 2020-11-06 LAB — C-REACTIVE PROTEIN: CRP: 18.8 mg/dL — ABNORMAL HIGH (ref <1.0)

## 2020-11-06 LAB — PROCALCITONIN: Procalcitonin: 0.7 ng/mL

## 2020-11-06 LAB — D-DIMER, QUANTITATIVE: D-Dimer, Quant: 1.69 ug/mL-FEU — ABNORMAL HIGH (ref 0.00–0.50)

## 2020-11-06 MED ORDER — CHLORHEXIDINE GLUCONATE 0.12% ORAL RINSE (MEDLINE KIT)
15.0000 mL | Freq: Two times a day (BID) | OROMUCOSAL | Status: DC
  Administered 2020-11-06 – 2020-11-12 (×14): 15 mL via OROMUCOSAL

## 2020-11-06 MED ORDER — DEXMEDETOMIDINE HCL IN NACL 400 MCG/100ML IV SOLN
0.4000 ug/kg/h | INTRAVENOUS | Status: DC
  Administered 2020-11-06 – 2020-11-07 (×3): 1.2 ug/kg/h via INTRAVENOUS
  Filled 2020-11-06 (×3): qty 100

## 2020-11-06 MED ORDER — MIDAZOLAM HCL 2 MG/2ML IJ SOLN: 4.0000 mg | Freq: Once | INTRAMUSCULAR | Status: AC

## 2020-11-06 MED ORDER — SODIUM CHLORIDE 0.9% FLUSH: 10.0000 mL | INTRAVENOUS | Status: DC | PRN

## 2020-11-06 MED ORDER — ORAL CARE MOUTH RINSE
15.0000 mL | OROMUCOSAL | Status: DC
  Administered 2020-11-06 – 2020-11-12 (×65): 15 mL via OROMUCOSAL

## 2020-11-06 MED ORDER — PROSOURCE TF PO LIQD
45.0000 mL | Freq: Every day | ORAL | Status: DC
  Administered 2020-11-06 – 2020-11-12 (×7): 45 mL
  Filled 2020-11-06 (×6): qty 45

## 2020-11-06 MED ORDER — FREE WATER
200.0000 mL | Freq: Three times a day (TID) | Status: DC
  Administered 2020-11-06 – 2020-11-07 (×3): 200 mL

## 2020-11-06 MED ORDER — VITAL HIGH PROTEIN PO LIQD
1000.0000 mL | ORAL | Status: DC
  Administered 2020-11-06 – 2020-11-12 (×6): 1000 mL
  Filled 2020-11-06 (×7): qty 1000

## 2020-11-06 MED ORDER — SODIUM CHLORIDE 0.9% FLUSH
10.0000 mL | Freq: Two times a day (BID) | INTRAVENOUS | Status: DC
  Administered 2020-11-06 – 2020-11-12 (×11): 10 mL

## 2020-11-06 MED ORDER — MIDAZOLAM HCL 2 MG/2ML IJ SOLN
INTRAMUSCULAR | Status: AC
  Administered 2020-11-06: 4 mg via INTRAVENOUS
  Filled 2020-11-06: qty 4

## 2020-11-06 NOTE — Progress Notes (Signed)
70cc of dilaudid gtt wasted in stericycle by this RN, witnessed by Saralyn Pilar RN

## 2020-11-06 NOTE — Progress Notes (Signed)
Initial Nutrition Assessment  DOCUMENTATION CODES:   Obesity unspecified  INTERVENTION:   Tube Feeding via OG:  Vital High Protein at 65 ml/hr Pro-Source 45 mL daily Provides 148 g of protein, 1600 kcals and 1310 mL free water Meets 100% estimated calorie and protein needs  NUTRITION DIAGNOSIS:   Inadequate oral intake related to acute illness as evidenced by NPO status.  GOAL:   Patient will meet greater than or equal to 90% of their needs  MONITOR:   Vent status, TF tolerance, Labs, Weight trends  REASON FOR ASSESSMENT:   Consult, Ventilator Enteral/tube feeding initiation and management  ASSESSMENT:   64 yo male admitted with acute respiratory failure secondary to COVID-19 pneumonia with aspiration requiring intubation. PMH includes severe schizoaffective disorder, anxiety/depression, complete heart block s/p pacemaker  12/03 Admitted 12/06 Intubated  Pt remains on vent support, currently off levophed  OG tube with tip in stomach  Unable to obtain diet and weight history from patient at this time. No recorded po intake prior to intubation.  Only weight from this admission from 12/3 105 kg. No weight loss per weight encounters.   Labs:  reviewed Meds: decadron   Diet Order:   Diet Order            Diet NPO time specified  Diet effective now                 EDUCATION NEEDS:   Not appropriate for education at this time  Skin:  Skin Assessment: Reviewed RN Assessment  Last BM:  no documented BM  Height:   Ht Readings from Last 1 Encounters:  November 30, 2020 5\' 9"  (1.753 m)    Weight:   Wt Readings from Last 1 Encounters:  30-Nov-2020 105 kg    BMI:  Body mass index is 34.18 kg/m.  Estimated Nutritional Needs:   Kcal:  14/03/21 kcals  Protein:  125-155 g  Fluid:  >/= 2 L   [id number] MS, RDN, LDN, CNSC Registered Dietitian III Clinical Nutrition RD Pager and On-Call Pager Number Located in [place]

## 2020-11-06 NOTE — Progress Notes (Signed)
NAME:  Billy CanterJohn Thomas Simon, MRN:  [id number], DOB:  1956/06/08, LOS: 4 ADMISSION DATE:  11/04/2020, CONSULTATION DATE:  12/6 REFERRING MD:  Dr Thedore MinsSingh, CHIEF COMPLAINT:  AMS    Brief History   64yo male presented with known diagnosis of COVID now with worsening confusion. PCCM consulted 12/6 for worsening confusion with concern for developing sepsis due to aspiration pneumonia requiring intubation and mechanical ventilation  Past Medical History  Severe schizoaffective disorder, complete heart block S/P pacemaker, anxiety, depression, and syncope  Significant Hospital Events   Admitted 12/3  Consults:  PCCM  Procedures:  12/6 ETT 12/6 RIJ  Significant Diagnostic Tests:   Head CT 12/3 > Negative   CXR 12/6 > Bilateral pulmonary opacities with improved right lung aeration.  Micro Data:  COVID 12/3 > positive  Blood culture 12/6 >  Antimicrobials:  Remdesivir 12/4 >  Interim history/subjective:  Completely off sedation but remained unresponsive on mechanical ventilation  Objective   Blood pressure 105/63, pulse 60, temperature (!) 97.1 F (36.2 C), temperature source Axillary, resp. rate (!) 24, height 5\' 9"  (1.753 m), weight 105 kg, SpO2 93 %.    Vent Mode: PRVC FiO2 (%):  [40 %-100 %] 40 % Set Rate:  [24 bmp] 24 bmp Vt Set:  [560 mL] 560 mL PEEP:  [8 cmH20-10 cmH20] 8 cmH20 Plateau Pressure:  [22 cmH20] 22 cmH20   Intake/Output Summary (Last 24 hours) at 11/06/2020 0931 Last data filed at 11/06/2020 0800 Gross per 24 hour  Intake 2241.67 ml  Output 900 ml  Net 1341.67 ml   Filed Weights   03/21/20 0224  Weight: 105 kg    Examination: General: Critically ill appearing elderly male, orally intubated HEENT: ETT, MM pink/moist poor dentition, PERRL,  Neuro: Eyes closed, disconjugate gaze, pupils are pinpoint, minimally reactive, not following commands.  No response to painful stimuli CV: s1s2 paced and tachycardic, no murmur, rubs, or gallops,  PULM: Left lobe  crackles, otherwise clear GI: soft, bowel sounds active in all 4 quadrants, non-tender, non-distended Extremities: warm/dry Skin: no rashes or lesions   Resolved Hospital Problem list     Assessment & Plan:  Acute hypoxic/hypercapnic respiratory failure due to ARDS from COVID-19 pneumonia Complicated with aspiration pneumonia on left lower lobe  Continue lung protective mechanical ventilation On 40% FiO2 with 5 of PEEP currently X-ray chest confirmed left lower lobe infiltrates Keep sedation off with RASS goal 0/-1 Continue remdesivir  Continue Unasyn to complete 7 days therapy  Encourage frequent pulmonary hygiene  Head of bed elevated 30 degrees. Follow cultures   Metabolic encephalopathy in the setting of acute COVID viral infection and hypoxia HX of severe schizoaffective disorder  Patient presented with acute AMS that developed around the time he was diagnosed with COVID, since admission confusion has progressively worsened  Head CT negative on 12/3 Currently off sedation but remained unresponsive Low threshold to get repeat head CT Home medications include Xanax, Cogentin, Wellbutrin, Buspar, Celexa, and Risperadal, holding for now EEG was negative for seizures  Complete heart block S/P pacemaker Continuous telemetry  Supportive care   Acute Kidney Injury  -in the setting of acute infection. Baseline creatinine 0.8 Patient serum creatinine is improving Trend Bmet Avoid nephrotoxins Ensure adequate renal perfusion   Urinary retention with BPH  Continue home Flomax once enteral access is established   Medical sales representativeBest practice (evaluated daily)  Diet: NPO  Pain/Anxiety/Delirium protocol (if indicated): Holding all sedation and pain meds VAP protocol (if indicated): N/A DVT prophylaxis: Lovenox  GI prophylaxis: PPI Glucose control: SSI Mobility: Bedrest  last date of multidisciplinary goals of care discussion: 12/6 Family and staff present: self and wife via  phone Summary of discussion: full code with aggressive support Follow up goals of care discussion due:  In 5-7 days Code Status: Full Disposition: ICU   Labs   CBC: Recent Labs  Lab 12/02/20 0234 Dec 02, 2020 0234 2020-12-02 0727 12-02-20 0727 11/03/20 0606 11/04/20 0758 11/05/20 0451 11/05/20 1517 11/06/20 0645  WBC 5.3   < > 4.9  --  5.3 7.2 16.6*  --  12.1*  NEUTROABS 4.5  --  3.6  --   --   --   --   --   --   HGB 14.2   < > 14.1   < > 14.0 15.0 14.9 13.3 12.9*  HCT 43.3   < > 44.2   < > 43.6 45.8 44.6 39.0 39.7  MCV 87.1   < > 88.9  --  88.8 87.2 85.9  --  87.4  PLT 152   < > 143*  --  159 183 210  --  131*   < > = values in this interval not displayed.    Basic Metabolic Panel: Recent Labs  Lab 2020-12-02 0400 12-02-2020 0400 11/03/20 0606 11/04/20 0758 11/05/20 0451 11/05/20 1517 11/06/20 0645  NA 141   < > 141 144 146* 148* 143  K 4.0   < > 3.9 5.1 4.2 4.3 4.0  CL 105  --  105 107 108  --  107  CO2 23  --  24 24 24   --  26  GLUCOSE 105*  --  92 102* 106*  --  163*  BUN 15  --  12 16 26*  --  29*  CREATININE 1.29*  --  1.09 0.96 1.31*  --  1.10  CALCIUM 8.5*  --  8.2* 8.5* 8.8*  --  8.0*  MG  --   --  1.7 2.1 2.1  --  2.3   < > = values in this interval not displayed.   GFR: Estimated Creatinine Clearance: 81 mL/min (by C-G formula based on SCr of 1.1 mg/dL). Recent Labs  Lab 2020-12-02 0400 12/02/20 0727 11/03/20 0606 11/04/20 0758 11/04/20 1513 11/05/20 0451 11/06/20 0645  PROCALCITON <0.10  --   --   --  <0.10 0.66  --   WBC  --    < > 5.3 7.2  --  16.6* 12.1*   < > = values in this interval not displayed.    Liver Function Tests: Recent Labs  Lab 12-02-2020 0400 11/03/20 0606 11/04/20 0758 11/05/20 0451 11/06/20 0645  AST 83* 121* 223* 370* 215*  ALT 65* 90* 159* 153* 116*  ALKPHOS 48 52 55 67 59  BILITOT 0.5 0.7 1.4* 1.0 1.0  PROT 6.1* 6.2* 6.4* 6.5 5.7*  ALBUMIN 3.3* 3.3* 3.3* 3.3* 2.6*   No results for input(s): LIPASE, AMYLASE in the  last 168 hours. Recent Labs  Lab Dec 02, 2020 0727  AMMONIA 33    ABG    Component Value Date/Time   PHART 7.403 11/05/2020 1517   PCO2ART 42.4 11/05/2020 1517   PO2ART 96 11/05/2020 1517   HCO3 26.1 11/05/2020 1517   TCO2 27 11/05/2020 1517   ACIDBASEDEF 3.0 (H) 09/20/2018 0411   O2SAT 97.0 11/05/2020 1517     Coagulation Profile: No results for input(s): INR, PROTIME in the last 168 hours.  Cardiac Enzymes: No results for input(s): CKTOTAL, CKMB, CKMBINDEX,  TROPONINI in the last 168 hours.  HbA1C: No results found for: HGBA1C  CBG: Recent Labs  Lab 11/05/20 1606 11/05/20 2013 11/05/20 2339 11/06/20 0630 11/06/20 0759  GLUCAP 163* 169* 165* 163* 157*    Review of Systems:   unobtainable as patient is unresponsive and intubated  Past Medical History  He,  has a past medical history of CHB (complete heart block) (HCC) (09/14/2018), COVID-19, and Syncope (08/2018).   Surgical History    Past Surgical History:  Procedure Laterality Date  . LEFT HEART CATH AND CORONARY ANGIOGRAPHY N/A 09/14/2018   Procedure: LEFT HEART CATH AND CORONARY ANGIOGRAPHY;  Surgeon: Swaziland, Peter M, MD;  Location[facility] INVASIVE CV LAB;  Service: Cardiovascular;  Laterality: N/A;  . PACEMAKER IMPLANT N/A 09/16/2018   Procedure: PACEMAKER IMPLANT;  Surgeon: Regan Lemming, MD;  Location: [facility] INVASIVE CV LAB;  Service: Cardiovascular;  Laterality: N/A;  . TEMPORARY PACEMAKER N/A 09/14/2018   Procedure: TEMPORARY PACEMAKER;  Surgeon: Swaziland, Peter M, MD;  Location: [facility] INVASIVE CV LAB;  Service: Cardiovascular;  Laterality: N/A;     Social History   reports that he has never smoked. He has never used smokeless tobacco. He reports that he does not drink alcohol.   Family History   His family history includes Heart Problems in his father; Heart attack in his father; Leukemia in his mother.   Allergies Allergies  Allergen Reactions  . Duragesic-100 [Fentanyl] Other (See Comments)    Pt  became violent     Home Medications  Prior to Admission medications   Medication Sig Start Date End Date Taking? Authorizing Provider  ALPRAZolam Prudy Feeler) 0.5 MG tablet Take 1 tablet (0.5 mg total) by mouth 2 (two) times daily as needed for anxiety. 10/11/18  Yes Barnetta Chapel, MD  ALPRAZolam Prudy Feeler) 1 MG tablet Take 1 mg by mouth 2 (two) times daily as needed. 05/07/20  Yes [provider]  ascorbic acid (VITAMIN C) 500 MG tablet Take 500 mg by mouth 2 (two) times daily.   Yes [provider]  aspirin 81 MG EC tablet Take 81 mg by mouth daily.    Yes [provider]  atorvastatin (LIPITOR) 40 MG tablet Take 40 mg by mouth at bedtime. 09/06/20  Yes [provider]  benztropine (COGENTIN) 1 MG tablet Take 1 mg by mouth See admin instructions. 2 tabs in the morning 1 tab hs   Yes [provider]  buPROPion (WELLBUTRIN XL) 300 MG 24 hr tablet Take 300 mg by mouth daily.   Yes [provider]  busPIRone (BUSPAR) 15 MG tablet Take 1 tablet (15 mg total) by mouth daily. 10/12/18  Yes Berton Mount I, MD  Cholecalciferol (VITAMIN D3) 50 MCG (2000 UT) CAPS Take 1 capsule by mouth daily.   Yes [provider]  citalopram (CELEXA) 40 MG tablet Take 40 mg by mouth daily.   Yes [provider]  clopidogrel (PLAVIX) 75 MG tablet Take 75 mg by mouth daily. 09/30/20  Yes [provider]  meclizine (ANTIVERT) 25 MG tablet Take 25 mg by mouth 3 (three) times daily as needed for dizziness.   Yes [provider]  pantoprazole (PROTONIX) 40 MG tablet Take 40 mg by mouth daily. 05/08/19  Yes [provider]  risperiDONE (RISPERDAL) 1 MG tablet Take 1 mg by mouth 3 (three) times daily.  10/26/20  Yes [provider]  tamsulosin (FLOMAX) 0.4 MG CAPS capsule Take 0.4 mg by mouth daily. 10/11/20  Yes  [provider]  Zinc 100 MG TABS Take 1 tablet by mouth daily.   Yes [provider]   risperiDONE (RISPERDAL) 1 MG/ML oral solution Place 2 mLs (2 mg total) into feeding tube 3 (three) times daily. Patient not taking: Reported on 11/11/2020 10/11/18   Berton Mount I, MD    Total critical care time: 43 minutes  Performed by: Cheri Fowler   Critical care time was exclusive of separately billable procedures and treating other patients.   Critical care was necessary to treat or prevent imminent or life-threatening deterioration.   Critical care was time spent personally by me on the following activities: development of treatment plan with patient and/or surrogate as well as nursing, discussions with consultants, evaluation of patient's response to treatment, examination of patient, obtaining history from patient or surrogate, ordering and performing treatments and interventions, ordering and review of laboratory studies, ordering and review of radiographic studies, pulse oximetry and re-evaluation of patient's condition.   Cheri Fowler MD [facility] Pager: [phone] Mobile: [phone]

## 2020-11-06 NOTE — Progress Notes (Signed)
OT Cancellation Note  Patient Details Name: Billy Simon MRN: [id number] DOB: 1956-09-04   Cancelled Treatment:    Reason Eval/Treat Not Completed: Patient not medically ready, will continue to follow for medical appropriateness for therapy evaluation (Cancel #2)  Evern Bio Michal Strzelecki 11/06/2020, 9:05 AM   Nyoka Cowden OTR/L Acute Rehabilitation Services Pager: [phone] Office: [phone]

## 2020-11-06 NOTE — Progress Notes (Signed)
PT Cancellation Note  Patient Details Name: Billy Simon MRN: [id number] DOB: [birth year]   Cancelled Treatment:    Reason Eval/Treat Not Completed: Patient not medically ready (pt transfer from 5w to 80m and intubated)   Billy Simon 11/06/2020, 6:43 AM  Billy Simon, PT Acute Rehabilitation Services Pager: [phone] Office: [phone]

## 2020-11-07 ENCOUNTER — Inpatient Hospital Stay (HOSPITAL_COMMUNITY): Payer: Medicaid Other

## 2020-11-07 DIAGNOSIS — U071 COVID-19: Secondary | ICD-10-CM

## 2020-11-07 DIAGNOSIS — J9601 Acute respiratory failure with hypoxia: Secondary | ICD-10-CM

## 2020-11-07 LAB — GLUCOSE, CAPILLARY
Glucose-Capillary: 133 mg/dL — ABNORMAL HIGH (ref 70–99)
Glucose-Capillary: 113 mg/dL — ABNORMAL HIGH (ref 70–99)
Glucose-Capillary: 128 mg/dL — ABNORMAL HIGH (ref 70–99)
Glucose-Capillary: 140 mg/dL — ABNORMAL HIGH (ref 70–99)
Glucose-Capillary: 154 mg/dL — ABNORMAL HIGH (ref 70–99)

## 2020-11-07 LAB — COMPREHENSIVE METABOLIC PANEL
ALT: 105 U/L — ABNORMAL HIGH (ref 0–44)
AST: 150 U/L — ABNORMAL HIGH (ref 15–41)
Albumin: 2.4 g/dL — ABNORMAL LOW (ref 3.5–5.0)
Alkaline Phosphatase: 66 U/L (ref 38–126)
Anion gap: 11 (ref 5–15)
BUN: 32 mg/dL — ABNORMAL HIGH (ref 8–23)
CO2: 26 mmol/L (ref 22–32)
Calcium: 8 mg/dL — ABNORMAL LOW (ref 8.9–10.3)
Chloride: 110 mmol/L (ref 98–111)
Creatinine, Ser: 1.07 mg/dL (ref 0.61–1.24)
GFR, Estimated: >60 mL/min (ref >60)
Glucose, Bld: 111 mg/dL — ABNORMAL HIGH (ref 70–99)
Potassium: 4 mmol/L (ref 3.5–5.1)
Sodium: 147 mmol/L — ABNORMAL HIGH (ref 135–145)
Total Bilirubin: 0.9 mg/dL (ref 0.3–1.2)
Total Protein: 5.3 g/dL — ABNORMAL LOW (ref 6.5–8.1)

## 2020-11-07 LAB — POCT I-STAT 7, (LYTES, BLD GAS, ICA,H+H)
Acid-Base Excess: 2 mmol/L (ref 0.0–2.0)
Bicarbonate: 25.3 mmol/L (ref 20.0–28.0)
Calcium, Ion: 1.14 mmol/L — ABNORMAL LOW (ref 1.15–1.40)
HCT: 36 % — ABNORMAL LOW (ref 39.0–52.0)
Hemoglobin: 12.2 g/dL — ABNORMAL LOW (ref 13.0–17.0)
O2 Saturation: 90 %
Potassium: 4 mmol/L (ref 3.5–5.1)
Sodium: 146 mmol/L — ABNORMAL HIGH (ref 135–145)
TCO2: 26 mmol/L (ref 22–32)
pCO2 arterial: 34 mmHg (ref 32.0–48.0)
pH, Arterial: 7.481 — ABNORMAL HIGH (ref 7.350–7.450)
pO2, Arterial: 54 mmHg — ABNORMAL LOW (ref 83.0–108.0)

## 2020-11-07 LAB — PHOSPHORUS
Phosphorus: 1.7 mg/dL — ABNORMAL LOW (ref 2.5–4.6)
Phosphorus: 4.3 mg/dL (ref 2.5–4.6)

## 2020-11-07 LAB — CBC
HCT: 38.4 % — ABNORMAL LOW (ref 39.0–52.0)
Hemoglobin: 13.1 g/dL (ref 13.0–17.0)
MCH: 29.5 pg (ref 26.0–34.0)
MCHC: 34.1 g/dL (ref 30.0–36.0)
MCV: 86.5 fL (ref 80.0–100.0)
Platelets: 174 10*3/uL (ref 150–400)
RBC: 4.44 MIL/uL (ref 4.22–5.81)
RDW: 14.1 % (ref 11.5–15.5)
WBC: 10.9 10*3/uL — ABNORMAL HIGH (ref 4.0–10.5)
nRBC: 0 % (ref 0.0–0.2)

## 2020-11-07 LAB — D-DIMER, QUANTITATIVE: D-Dimer, Quant: 3.66 ug/mL-FEU — ABNORMAL HIGH (ref 0.00–0.50)

## 2020-11-07 LAB — PROCALCITONIN: Procalcitonin: 0.47 ng/mL

## 2020-11-07 LAB — TRIGLYCERIDES: Triglycerides: 124 mg/dL (ref <150)

## 2020-11-07 LAB — BRAIN NATRIURETIC PEPTIDE: B Natriuretic Peptide: 216.5 pg/mL — ABNORMAL HIGH (ref 0.0–100.0)

## 2020-11-07 LAB — MAGNESIUM
Magnesium: 2.1 mg/dL (ref 1.7–2.4)
Magnesium: 2 mg/dL (ref 1.7–2.4)

## 2020-11-07 LAB — C-REACTIVE PROTEIN: CRP: 10.6 mg/dL — ABNORMAL HIGH (ref <1.0)

## 2020-11-07 MED ORDER — POTASSIUM & SODIUM PHOSPHATES 280-160-250 MG PO PACK
1.0000 | PACK | Freq: Three times a day (TID) | ORAL | Status: DC
  Administered 2020-11-07 (×2): 1 via ORAL
  Filled 2020-11-07 (×2): qty 1

## 2020-11-07 MED ORDER — FUROSEMIDE 10 MG/ML IJ SOLN
60.0000 mg | Freq: Once | INTRAMUSCULAR | Status: AC
  Administered 2020-11-07: 60 mg via INTRAVENOUS
  Filled 2020-11-07: qty 6

## 2020-11-07 MED ORDER — POTASSIUM & SODIUM PHOSPHATES 280-160-250 MG PO PACK
1.0000 | PACK | Freq: Three times a day (TID) | ORAL | Status: AC
  Administered 2020-11-07: 1
  Filled 2020-11-07: qty 1

## 2020-11-07 MED ORDER — FREE WATER
200.0000 mL | Status: DC
  Administered 2020-11-07 – 2020-11-12 (×33): 200 mL

## 2020-11-07 MED ORDER — SODIUM CHLORIDE 0.9 % IV SOLN
2.0000 mg/h | INTRAVENOUS | Status: DC
  Administered 2020-11-07 – 2020-11-09 (×3): 2 mg/h via INTRAVENOUS
  Filled 2020-11-07 (×3): qty 5

## 2020-11-07 MED ORDER — PROPOFOL 1000 MG/100ML IV EMUL
INTRAVENOUS | Status: AC
  Administered 2020-11-07: 40 ug/kg/min via INTRAVENOUS
  Filled 2020-11-07: qty 100

## 2020-11-07 MED ORDER — VECURONIUM BROMIDE 10 MG IV SOLR
10.0000 mg | Freq: Once | INTRAVENOUS | Status: AC
  Administered 2020-11-07: 10 mg via INTRAVENOUS
  Filled 2020-11-07: qty 10

## 2020-11-07 MED ORDER — PROPOFOL 1000 MG/100ML IV EMUL
5.0000 ug/kg/min | INTRAVENOUS | Status: DC
  Administered 2020-11-07 (×2): 50 ug/kg/min via INTRAVENOUS
  Administered 2020-11-07 (×3): 40 ug/kg/min via INTRAVENOUS
  Administered 2020-11-07 – 2020-11-08 (×2): 30 ug/kg/min via INTRAVENOUS
  Administered 2020-11-08 (×2): 40 ug/kg/min via INTRAVENOUS
  Filled 2020-11-07 (×3): qty 100
  Filled 2020-11-07: qty 200
  Filled 2020-11-07 (×4): qty 100
  Filled 2020-11-07: qty 200

## 2020-11-07 NOTE — Progress Notes (Signed)
Pt ETT hollister and bite block removed. No breakdown to the lips, tongue or cheeks at this time. Protective barriers placed on pts cheeks and upper lip. ETT resecured at 25cm at the right center with cloth tape. Pt then placed into the prone position with RT x2 and RN x5. Pt tolerated well. Suction catheter passes with ease. RT will continue to monitor and be available as needed.

## 2020-11-07 NOTE — Progress Notes (Signed)
ABG results given to Dr. Merrily Pew. Pt continues to be very asynchronous, RR into the 40's. Pt to get paralytic and increased sedation. Repeat ABG to be drawn at a later time. RT will continue to monitor.

## 2020-11-07 NOTE — Progress Notes (Signed)
OT Cancellation Note  Patient Details Name: Muad Noga MRN: [id number] DOB: November 16, 1956   Cancelled Treatment:    Reason Eval/Treat Not Completed: Patient not medically ready  Burnett Corrente Darcy Cordner, OT/L   Acute OT Clinical Specialist Acute Rehabilitation Services Pager [phone] Office [phone]  11/07/2020, 10:17 AM

## 2020-11-07 NOTE — Progress Notes (Signed)
Phos 1.7 Replaced per protocol  

## 2020-11-07 NOTE — Progress Notes (Signed)
Per CCM MD no need to repeat ABG at this time. Plan to prone pt when staffing appropriate. RT will continue to monitor.

## 2020-11-07 NOTE — Progress Notes (Signed)
eLink Physician-Brief Progress Note Patient Name: Billy Simon DOB: 1956/05/19 MRN: [id number]   Date of Service  11/07/2020  HPI/Events of Note  Patient desaturated requiring an increase in FIO2 from 60 to 80 %, he was initially dyssynchronous but has settled down and is not currently agitated or dyssynchronous.  eICU Interventions  Stat portable CXR to r/o pneumothorax vs other reason for acute desaturation (It may be due to earlier dyssynchrony but patient has not had a CXR in > 24 hours).        Thomasene Lot Vear Staton 11/07/2020, 6:55 AM

## 2020-11-07 NOTE — Progress Notes (Signed)
NAME:  Billy Simon, MRN:  [id number], DOB:  07/23/1956, LOS: 5 ADMISSION DATE:  11/15/2020, CONSULTATION DATE:  12/6 REFERRING MD:  Dr Thedore Mins, CHIEF COMPLAINT:  AMS    Brief History   64yo male presented with known diagnosis of COVID now with worsening confusion. PCCM consulted 12/6 for worsening confusion with concern for developing sepsis due to aspiration pneumonia requiring intubation and mechanical ventilation  Past Medical History  Severe schizoaffective disorder, complete heart block S/P pacemaker, anxiety, depression, and syncope  Significant Hospital Events   Admitted 12/3  Consults:  PCCM  Procedures:  12/6 ETT 12/6 RIJ  Significant Diagnostic Tests:   Head CT 12/3 > Negative   CXR 12/6 > Bilateral pulmonary opacities with improved right lung aeration.  Micro Data:  COVID 12/3 > positive  Blood culture 12/6 >  Antimicrobials:  Remdesivir 12/4 >  Interim history/subjective:  Patient's oxygen requirement has gone up, his PaO2 is only 54, he was dyssynchronous with the vent 98, had to be started on propofol. He had a head CT repeated which showed no acute changes  Objective   Blood pressure (!) 87/56, pulse 71, temperature (!) 100.8 F (38.2 C), temperature source Axillary, resp. rate (!) 24, height 5\' 9"  (1.753 m), weight 89.2 kg, SpO2 91 %.    Vent Mode: PRVC FiO2 (%):  [40 %] 40 % Set Rate:  [24 bmp] 24 bmp Vt Set:  [560 mL] 560 mL PEEP:  [5 cmH20-24 cmH20] 5 cmH20 Plateau Pressure:  [15 cmH20-20 cmH20] 15 cmH20   Intake/Output Summary (Last 24 hours) at 11/07/2020 1040 Last data filed at 11/07/2020 1017 Gross per 24 hour  Intake 1870.47 ml  Output 1100 ml  Net 770.47 ml   Filed Weights   11/18/2020 0224 11/07/20 0455  Weight: 105 kg 89.2 kg    Examination: General: Critically ill appearing elderly male, orally intubated HEENT: ETT, MM pink/moist poor dentition, PERRL,  Neuro: Eyes closed, disconjugate gaze, pupils are 2 mm, minimally  reactive, not following commands.  CV: s1s2 paced and tachycardic, no murmur, rubs, or gallops,  PULM: Tachypneic, left lung crackles, otherwise clear GI: soft, bowel sounds active in all 4 quadrants, non-tender, non-distended Extremities: warm/dry Skin: no rashes or lesions   Resolved Hospital Problem list     Assessment & Plan:  Acute hypoxic/hypercapnic respiratory failure due to ARDS from COVID-19 pneumonia Complicated with multifocal aspiration pneumonia  Continue lung protective mechanical ventilation Patient's FiO2 requirement went up to 100% and currently is on 10 of PEEP Repeat x-ray chest showing worsening multifocal infiltrates Started on IV propofol and fentanyl infusion We will give him 1 dose of vecuronium for vent dyssynchrony We will repeat ABG after if he still remains hypoxic and his P/F ratio is less than 150 we will prone him Continue remdesivir  Continue Unasyn to complete 7 days therapy  Encourage frequent pulmonary hygiene  Head of bed elevated 30 degrees. Repeat respiratory culture  Acute metabolic encephalopathy in the setting of acute COVID viral infection and hypoxia HX of severe schizoaffective disorder  Patient presented with acute AMS that developed around the time he was diagnosed with COVID, since admission confusion has progressively worsened  Repeat head CT was negative Currently on deep sedation Continue home medications include Xanax, Cogentin, Wellbutrin, Buspar, Celexa, and Risperadal EEG was negative for seizures  Complete heart block S/P pacemaker Continuous telemetry  Supportive care   Acute Kidney Injury  -in the setting of acute infection. Baseline creatinine 0.8 Patient serum  creatinine is stable around 1.1 Trend Bmet Avoid nephrotoxins Ensure adequate renal perfusion   Urinary retention with BPH  Continue home Flomax once enteral access is established   Best practice (evaluated daily)  Diet: NPO/tube  feeding Pain/Anxiety/Delirium protocol (if indicated): Fentanyl propofol VAP protocol (if indicated): N/A DVT prophylaxis: Lovenox  GI prophylaxis: PPI Glucose control: SSI Mobility: Bedrest  last date of multidisciplinary goals of care discussion: 12/6 Family and staff present: self and wife via phone Summary of discussion: full code with aggressive support Follow up goals of care discussion due:  In 5-7 days Code Status: Full Disposition: ICU   Labs   CBC: Recent Labs  Lab 11-09-20 0234 Nov 09, 2020 0234 November 09, 2020 2595 11/09/20 0727 11/03/20 0606 11/03/20 0606 11/04/20 0758 11/04/20 0758 11/05/20 0451 11/05/20 1517 11/06/20 0645 11/07/20 0330 11/07/20 0914  WBC 5.3   < > 4.9   < > 5.3  --  7.2  --  16.6*  --  12.1* 10.9*  --   NEUTROABS 4.5  --  3.6  --   --   --   --   --   --   --   --   --   --   HGB 14.2   < > 14.1   < > 14.0   < > 15.0   < > 14.9 13.3 12.9* 13.1 12.2*  HCT 43.3   < > 44.2   < > 43.6   < > 45.8   < > 44.6 39.0 39.7 38.4* 36.0*  MCV 87.1   < > 88.9   < > 88.8  --  87.2  --  85.9  --  87.4 86.5  --   PLT 152   < > 143*   < > 159  --  183  --  210  --  131* 174  --    < > = values in this interval not displayed.    Basic Metabolic Panel: Recent Labs  Lab 11/03/20 0606 11/03/20 0606 11/04/20 0758 11/04/20 0758 11/05/20 0451 11/05/20 1517 11/06/20 0645 11/06/20 1115 11/06/20 1640 11/07/20 0330 11/07/20 0914  NA 141   < > 144   < > 146* 148* 143  --   --  147* 146*  K 3.9   < > 5.1   < > 4.2 4.3 4.0  --   --  4.0 4.0  CL 105  --  107  --  108  --  107  --   --  110  --   CO2 24  --  24  --  24  --  26  --   --  26  --   GLUCOSE 92  --  102*  --  106*  --  163*  --   --  111*  --   BUN 12  --  16  --  26*  --  29*  --   --  32*  --   CREATININE 1.09  --  0.96  --  1.31*  --  1.10  --   --  1.07  --   CALCIUM 8.2*  --  8.5*  --  8.8*  --  8.0*  --   --  8.0*  --   MG 1.7   < > 2.1   < > 2.1  --  2.3 2.5* 2.5* 2.1  --   PHOS  --   --   --   --    --   --   --  2.3* 2.6 1.7*  --    < > = values in this interval not displayed.   GFR: Estimated Creatinine Clearance: 77 mL/min (by C-G formula based on SCr of 1.07 mg/dL). Recent Labs  Lab 11/15/2020 0400 11/04/20 0758 11/04/20 1513 11/05/20 0451 11/06/20 0645 11/07/20 0330  PROCALCITON   < >  --  <0.10 0.66 0.70 0.47  WBC  --  7.2  --  16.6* 12.1* 10.9*   < > = values in this interval not displayed.    Liver Function Tests: Recent Labs  Lab 11/03/20 0606 11/04/20 0758 11/05/20 0451 11/06/20 0645 11/07/20 0330  AST 121* 223* 370* 215* 150*  ALT 90* 159* 153* 116* 105*  ALKPHOS 52 55 67 59 66  BILITOT 0.7 1.4* 1.0 1.0 0.9  PROT 6.2* 6.4* 6.5 5.7* 5.3*  ALBUMIN 3.3* 3.3* 3.3* 2.6* 2.4*   No results for input(s): LIPASE, AMYLASE in the last 168 hours. Recent Labs  Lab 11/14/2020 0727  AMMONIA 33    ABG    Component Value Date/Time   PHART 7.481 (H) 11/07/2020 0914   PCO2ART 34.0 11/07/2020 0914   PO2ART 54 (L) 11/07/2020 0914   HCO3 25.3 11/07/2020 0914   TCO2 26 11/07/2020 0914   ACIDBASEDEF 3.0 (H) 09/20/2018 0411   O2SAT 90.0 11/07/2020 0914     Coagulation Profile: No results for input(s): INR, PROTIME in the last 168 hours.  Cardiac Enzymes: No results for input(s): CKTOTAL, CKMB, CKMBINDEX, TROPONINI in the last 168 hours.  HbA1C: No results found for: HGBA1C  CBG: Recent Labs  Lab 11/06/20 1607 11/06/20 2052 11/06/20 2328 11/07/20 0416 11/07/20 0713  GLUCAP 134* 116* 113* 133* 113*    Review of Systems:   unobtainable as patient is unresponsive and intubated  Past Medical History  He,  has a past medical history of CHB (complete heart block) (HCC) (09/14/2018), COVID-19, and Syncope (08/2018).   Surgical History    Past Surgical History:  Procedure Laterality Date  . LEFT HEART CATH AND CORONARY ANGIOGRAPHY N/A 09/14/2018   Procedure: LEFT HEART CATH AND CORONARY ANGIOGRAPHY;  Surgeon: SwazilandJordan, Peter M, MD;  [facility];  Service: Cardiovascular;  Laterality: N/A;  . PACEMAKER IMPLANT N/A 09/16/2018   Procedure: PACEMAKER IMPLANT;  Surgeon: Regan Lemmingamnitz, Will Martin, MD;  Location: [facility] INVASIVE CV LAB;  Service: Cardiovascular;  Laterality: N/A;  . TEMPORARY PACEMAKER N/A 09/14/2018   Procedure: TEMPORARY PACEMAKER;  Surgeon: SwazilandJordan, Peter M, MD;  [facility];  Service: Cardiovascular;  Laterality: N/A;     Social History   reports that he has never smoked. He has never used smokeless tobacco. He reports that he does not drink alcohol.   Family History   His family history includes Heart Problems in his father; Heart attack in his father; Leukemia in his mother.   Allergies Allergies  Allergen Reactions  . Duragesic-100 [Fentanyl] Other (See Comments)    Pt became violent     Home Medications  Prior to Admission medications   Medication Sig Start Date End Date Taking? Authorizing Provider  ALPRAZolam Prudy Feeler(XANAX) 0.5 MG tablet Take 1 tablet (0.5 mg total) by mouth 2 (two) times daily as needed for anxiety. 10/11/18  Yes Barnetta Chapelgbata, Sylvester I, MD  ALPRAZolam Prudy Feeler(XANAX) 1 MG tablet Take 1 mg by mouth 2 (two) times daily as needed. 05/07/20  Yes [provider]  ascorbic acid (VITAMIN C) 500 MG tablet Take 500 mg by mouth 2 (two) times daily.  Yes [provider]  aspirin 81 MG EC tablet Take 81 mg by mouth daily.    Yes [provider]  atorvastatin (LIPITOR) 40 MG tablet Take 40 mg by mouth at bedtime. 09/06/20  Yes [provider]  benztropine (COGENTIN) 1 MG tablet Take 1 mg by mouth See admin instructions. 2 tabs in the morning 1 tab hs   Yes [provider]  buPROPion (WELLBUTRIN XL) 300 MG 24 hr tablet Take 300 mg by mouth daily.   Yes [provider]  busPIRone (BUSPAR) 15 MG tablet Take 1 tablet (15 mg total) by mouth daily. 10/12/18  Yes Berton Mount I, MD  Cholecalciferol (VITAMIN D3) 50 MCG (2000 UT) CAPS Take 1 capsule by mouth  daily.   Yes [provider]  citalopram (CELEXA) 40 MG tablet Take 40 mg by mouth daily.   Yes [provider]  clopidogrel (PLAVIX) 75 MG tablet Take 75 mg by mouth daily. 09/30/20  Yes [provider]  meclizine (ANTIVERT) 25 MG tablet Take 25 mg by mouth 3 (three) times daily as needed for dizziness.   Yes [provider]  pantoprazole (PROTONIX) 40 MG tablet Take 40 mg by mouth daily. 05/08/19  Yes [provider]  risperiDONE (RISPERDAL) 1 MG tablet Take 1 mg by mouth 3 (three) times daily.  10/26/20  Yes [provider]  tamsulosin (FLOMAX) 0.4 MG CAPS capsule Take 0.4 mg by mouth daily. 10/11/20  Yes [provider]  Zinc 100 MG TABS Take 1 tablet by mouth daily.   Yes [provider]  risperiDONE (RISPERDAL) 1 MG/ML oral solution Place 2 mLs (2 mg total) into feeding tube 3 (three) times daily. Patient not taking: Reported on 11/26/2020 10/11/18   Berton Mount I, MD    Total critical care time: 41  minutes  Performed by: Cheri Fowler   Critical care time was exclusive of separately billable procedures and treating other patients.   Critical care was necessary to treat or prevent imminent or life-threatening deterioration.   Critical care was time spent personally by me on the following activities: development of treatment plan with patient and/or surrogate as well as nursing, discussions with consultants, evaluation of patient's response to treatment, examination of patient, obtaining history from patient or surrogate, ordering and performing treatments and interventions, ordering and review of laboratory studies, ordering and review of radiographic studies, pulse oximetry and re-evaluation of patient's condition.   Cheri Fowler MD [facility] Pager: [phone] Mobile: [phone]

## 2020-11-07 NOTE — Progress Notes (Signed)
PT Cancellation Note  Patient Details Name: Billy Simon MRN: [id number] DOB: Mar 31, 1956   Cancelled Treatment:    Reason Eval/Treat Not Completed: Medical issues which prohibited therapy  Pt intubated, sedated, and on paralytics.  Not appropriate for PT, will hold today. Anise Salvo, PT Acute Rehab Services Pager [phone] [facility] [phone]   Rayetta Humphrey 11/07/2020, 10:18 AM

## 2020-11-07 NOTE — Progress Notes (Signed)
eLink Physician-Brief Progress Note Patient Name: Billy Simon DOB: 09-15-1956 MRN: [id number]   Date of Service  11/07/2020  HPI/Events of Note  Patient is very agitated and dyssynchronous on a Precedex infusion. He has recurrent urinary retention.  eICU Interventions  Precedex discontinued and Propofol substituted, Foley catheter ordered, if dyssynchrony persists patient will be given a dose of a paralytic.        Thomasene Lot Maren Wiesen 11/07/2020, 2:40 AM

## 2020-11-08 ENCOUNTER — Inpatient Hospital Stay (HOSPITAL_COMMUNITY): Payer: Medicaid Other

## 2020-11-08 DIAGNOSIS — R7989 Other specified abnormal findings of blood chemistry: Secondary | ICD-10-CM

## 2020-11-08 DIAGNOSIS — U071 COVID-19: Secondary | ICD-10-CM

## 2020-11-08 LAB — HEPARIN LEVEL (UNFRACTIONATED): Heparin Unfractionated: 0.97 IU/mL — ABNORMAL HIGH (ref 0.30–0.70)

## 2020-11-08 LAB — BLOOD GAS, ARTERIAL
Acid-Base Excess: 5.4 mmol/L — ABNORMAL HIGH (ref 0.0–2.0)
Bicarbonate: 31.1 mmol/L — ABNORMAL HIGH (ref 20.0–28.0)
Drawn by: 60057
FIO2: 100
O2 Saturation: 97.3 %
Patient temperature: 37
pCO2 arterial: 61.2 mmHg — ABNORMAL HIGH (ref 32.0–48.0)
pH, Arterial: 7.326 — ABNORMAL LOW (ref 7.350–7.450)
pO2, Arterial: 110 mmHg — ABNORMAL HIGH (ref 83.0–108.0)

## 2020-11-08 LAB — POCT I-STAT 7, (LYTES, BLD GAS, ICA,H+H)
Acid-Base Excess: 7 mmol/L — ABNORMAL HIGH (ref 0.0–2.0)
Bicarbonate: 32.3 mmol/L — ABNORMAL HIGH (ref 20.0–28.0)
Calcium, Ion: 1.1 mmol/L — ABNORMAL LOW (ref 1.15–1.40)
HCT: 35 % — ABNORMAL LOW (ref 39.0–52.0)
Hemoglobin: 11.9 g/dL — ABNORMAL LOW (ref 13.0–17.0)
O2 Saturation: 99 %
Potassium: 4.3 mmol/L (ref 3.5–5.1)
Sodium: 145 mmol/L (ref 135–145)
TCO2: 34 mmol/L — ABNORMAL HIGH (ref 22–32)
pCO2 arterial: 49 mmHg — ABNORMAL HIGH (ref 32.0–48.0)
pH, Arterial: 7.427 (ref 7.350–7.450)
pO2, Arterial: 142 mmHg — ABNORMAL HIGH (ref 83.0–108.0)

## 2020-11-08 LAB — COMPREHENSIVE METABOLIC PANEL
ALT: 124 U/L — ABNORMAL HIGH (ref 0–44)
AST: 111 U/L — ABNORMAL HIGH (ref 15–41)
Albumin: 2.2 g/dL — ABNORMAL LOW (ref 3.5–5.0)
Alkaline Phosphatase: 65 U/L (ref 38–126)
Anion gap: 7 (ref 5–15)
BUN: 31 mg/dL — ABNORMAL HIGH (ref 8–23)
CO2: 31 mmol/L (ref 22–32)
Calcium: 7.4 mg/dL — ABNORMAL LOW (ref 8.9–10.3)
Chloride: 106 mmol/L (ref 98–111)
Creatinine, Ser: 0.99 mg/dL (ref 0.61–1.24)
GFR, Estimated: >60 mL/min (ref >60)
Glucose, Bld: 155 mg/dL — ABNORMAL HIGH (ref 70–99)
Potassium: 4.5 mmol/L (ref 3.5–5.1)
Sodium: 144 mmol/L (ref 135–145)
Total Bilirubin: 1 mg/dL (ref 0.3–1.2)
Total Protein: 5.2 g/dL — ABNORMAL LOW (ref 6.5–8.1)

## 2020-11-08 LAB — D-DIMER, QUANTITATIVE: D-Dimer, Quant: >20 ug/mL-FEU — ABNORMAL HIGH (ref 0.00–0.50)

## 2020-11-08 LAB — C-REACTIVE PROTEIN: CRP: 17.8 mg/dL — ABNORMAL HIGH (ref <1.0)

## 2020-11-08 LAB — CBC
HCT: 37.7 % — ABNORMAL LOW (ref 39.0–52.0)
Hemoglobin: 12.4 g/dL — ABNORMAL LOW (ref 13.0–17.0)
MCH: 29.5 pg (ref 26.0–34.0)
MCHC: 32.9 g/dL (ref 30.0–36.0)
MCV: 89.5 fL (ref 80.0–100.0)
Platelets: 150 10*3/uL (ref 150–400)
RBC: 4.21 MIL/uL — ABNORMAL LOW (ref 4.22–5.81)
RDW: 14.6 % (ref 11.5–15.5)
WBC: 13.6 10*3/uL — ABNORMAL HIGH (ref 4.0–10.5)
nRBC: 0 % (ref 0.0–0.2)

## 2020-11-08 LAB — GLUCOSE, CAPILLARY
Glucose-Capillary: 143 mg/dL — ABNORMAL HIGH (ref 70–99)
Glucose-Capillary: 142 mg/dL — ABNORMAL HIGH (ref 70–99)
Glucose-Capillary: 168 mg/dL — ABNORMAL HIGH (ref 70–99)
Glucose-Capillary: 176 mg/dL — ABNORMAL HIGH (ref 70–99)
Glucose-Capillary: 174 mg/dL — ABNORMAL HIGH (ref 70–99)
Glucose-Capillary: 156 mg/dL — ABNORMAL HIGH (ref 70–99)

## 2020-11-08 MED ORDER — HEPARIN BOLUS VIA INFUSION
3000.0000 [IU] | Freq: Once | INTRAVENOUS | Status: AC
  Administered 2020-11-08: 3000 [IU] via INTRAVENOUS
  Filled 2020-11-08: qty 3000

## 2020-11-08 MED ORDER — PROPOFOL 1000 MG/100ML IV EMUL
5.0000 ug/kg/min | INTRAVENOUS | Status: DC
  Administered 2020-11-08 – 2020-11-09 (×6): 30 ug/kg/min via INTRAVENOUS
  Administered 2020-11-10: 23:00:00 80 ug/kg/min via INTRAVENOUS
  Administered 2020-11-10: 05:00:00 30 ug/kg/min via INTRAVENOUS
  Administered 2020-11-10: 14:00:00 60 ug/kg/min via INTRAVENOUS
  Administered 2020-11-10: 10:00:00 40 ug/kg/min via INTRAVENOUS
  Administered 2020-11-10 (×2): 60 ug/kg/min via INTRAVENOUS
  Administered 2020-11-11 – 2020-11-12 (×15): 80 ug/kg/min via INTRAVENOUS
  Administered 2020-11-12 (×2): 60 ug/kg/min via INTRAVENOUS
  Administered 2020-11-12 (×2): 80 ug/kg/min via INTRAVENOUS
  Filled 2020-11-08: qty 100
  Filled 2020-11-08 (×2): qty 200
  Filled 2020-11-08: qty 100
  Filled 2020-11-08: qty 300
  Filled 2020-11-08 (×2): qty 100
  Filled 2020-11-08: qty 200
  Filled 2020-11-08 (×9): qty 100
  Filled 2020-11-08: qty 200
  Filled 2020-11-08 (×8): qty 100

## 2020-11-08 MED ORDER — HEPARIN (PORCINE) 25000 UT/250ML-% IV SOLN
1050.0000 [IU]/h | INTRAVENOUS | Status: DC
  Administered 2020-11-08: 15:00:00 1500 [IU]/h via INTRAVENOUS
  Administered 2020-11-09 (×2): 1300 [IU]/h via INTRAVENOUS
  Administered 2020-11-10 – 2020-11-11 (×2): 1250 [IU]/h via INTRAVENOUS
  Filled 2020-11-08 (×5): qty 250

## 2020-11-08 NOTE — Progress Notes (Signed)
PT Cancellation Note  Patient Details Name: Billy Simon MRN: [id number] DOB: 04/12/1956   Cancelled Treatment:    Reason Eval/Treat Not Completed: Medical issues which prohibited therapy.  Pt continues to require high amounts of ventilatory support.  PT to sign off for now as pt is not stable enough to be seen for mobility.  Please re-order when appropriate.   Thanks,  Corinna Capra, PT, DPT  Acute Rehabilitation [phone] pager #[phone] office       [name] 11/08/2020, 11:14 AM

## 2020-11-08 NOTE — Progress Notes (Signed)
OT Cancellation Note  Patient Details Name: Billy Simon MRN: [id number] DOB: 09/03/1956   Cancelled Treatment:    Reason Eval/Treat Not Completed: Patient not medically ready (Intubated, sedated. RN agreeable with sign off. Please reconsult as pt medically ready. Thank you.)  Ranell Skibinski M Landra Howze Janequa Kipnis MSOT, OTR/L Acute Rehab Pager: [phone] Office: [phone]  11/08/2020, 7:56 AM

## 2020-11-08 NOTE — Progress Notes (Signed)
ANTICOAGULATION CONSULT NOTE - Initial Consult  Pharmacy Consult for heparin  Indication: DVT  Allergies  Allergen Reactions  . Duragesic-100 [Fentanyl] Other (See Comments)    Pt became violent    Patient Measurements: Height: 5\' 9"  (175.3 cm) Weight: 88.3 kg (194 lb 10.7 oz) IBW/kg (Calculated) : 70.7 Heparin Dosing Weight: 88 kg  Vital Signs: Temp: 97.7 F (36.5 C) (12/09 1122) Temp Source: Axillary (12/09 1122) BP: 111/64 (12/09 1200) Pulse Rate: 61 (12/09 1224)  Labs: Recent Labs    11/06/20 0645 11/07/20 0330 11/07/20 0914 11/08/20 0321 11/08/20 1146  HGB 12.9* 13.1 12.2* 12.4* 11.9*  HCT 39.7 38.4* 36.0* 37.7* 35.0*  PLT 131* 174  --  150  --   CREATININE 1.10 1.07  --  0.99  --     Estimated Creatinine Clearance: 82.8 mL/min (by C-G formula based on SCr of 0.99 mg/dL).  Assessment: 64 yo m presenting with COVID PNA - ddimer increased to > 20 this am  LE dopplers obtained showing BL DVTs  Has been on enox 40   Cbc stable  Goal of Therapy:  Heparin level 0.3-0.7 units/ml Monitor platelets by anticoagulation protocol: Yes   Plan:  Dc enox Heparin bolus 3000 units x 1 - 1/2 since on enox Heparin gtt 1500 units/hr 2200 HL Daily hep lvl cbc  77, PharmD, BCPS, BCCCP Clinical Pharmacist [phone]  Please check AMION for all [facility] Pharmacy numbers  11/08/2020 1:18 PM

## 2020-11-08 NOTE — Progress Notes (Signed)
VASCULAR LAB    Bilateral lower extremity venous duplex has been performed.  See CV proc for preliminary results.  Messaged Dr. Merrily Pew via Epic, with results.   Brendalee Matthies, RVT 11/08/2020, 12:22 PM

## 2020-11-08 NOTE — Progress Notes (Signed)
NAME:  Billy Simon, MRN:  [id number], DOB:  February 03, 1956, LOS: 6 ADMISSION DATE:  11/07/2020, CONSULTATION DATE:  12/6 REFERRING MD:  Dr Thedore Mins, CHIEF COMPLAINT:  AMS    Brief History   65yo male presented with known diagnosis of COVID now with worsening confusion. PCCM consulted 12/6 for worsening confusion with concern for developing sepsis due to aspiration pneumonia requiring intubation and mechanical ventilation  Past Medical History  Severe schizoaffective disorder, complete heart block S/P pacemaker, anxiety, depression, and syncope  Significant Hospital Events   Admitted 12/3  Consults:  PCCM  Procedures:  12/6 ETT 12/6 RIJ  Significant Diagnostic Tests:   Head CT 12/3 > Negative   CXR 12/6 > Bilateral pulmonary opacities with improved right lung aeration.  Micro Data:  COVID 12/3 > positive  Blood culture 12/6 >few GPC, few yeast>>>  Antimicrobials:  Remdesivir 12/4 > Unasyn 12/6>>>  Interim history/subjective:  No sig change overnight.  Remains on peep 10.  Weaning O2. Currently supine.   Objective   Blood pressure 117/60, pulse 60, temperature 98 F (36.7 C), temperature source Axillary, resp. rate (!) 24, height 5\' 9"  (1.753 m), weight 88.3 kg, SpO2 99 %.    Vent Mode: PRVC FiO2 (%):  [80 %-100 %] 80 % Set Rate:  [24 bmp] 24 bmp Vt Set:  [560 mL] 560 mL PEEP:  [5 cmH20-10 cmH20] 10 cmH20 Plateau Pressure:  [13 cmH20-24 cmH20] 24 cmH20   Intake/Output Summary (Last 24 hours) at 11/08/2020 0911 Last data filed at 11/08/2020 0801 Gross per 24 hour  Intake 3337.58 ml  Output 2065 ml  Net 1272.58 ml   Filed Weights   11/19/2020 0224 11/07/20 0455 11/08/20 0345  Weight: 105 kg 89.2 kg 88.3 kg    Examination: General: adult male, NAD  HEENT: ETT, mm moist, no JVD  Neuro: sedated, RASS-2, pupils are 2 mm, minimally reactive, not following commands.  CV: s1s2 paced and tachycardic, no murmur, rubs, or gallops,  PULM: resps even non labored on  vent, coarse L>R GI: soft, bowel sounds active in all 4 quadrants, non-tender, non-distended Extremities: warm/dry Skin: no rashes or lesions   Resolved Hospital Problem list     Assessment & Plan:  Acute hypoxic/hypercapnic respiratory failure due to ARDS from COVID-19 pneumonia Complicated with multifocal aspiration pneumonia  Continue lung protective mechanical ventilation Patient's FiO2 requirement went up to 90% and currently is on 10 of PEEP Repeat x-ray chest showing worsening multifocal infiltrates We will repeat ABG after if he still remains hypoxic and his P/F ratio is less than 150 we will prone him Continue remdesivir D5 Continue Unasyn to complete 7 days therapy  Head of bed elevated 30 degrees. F/u respiratory culture  Acute metabolic encephalopathy in the setting of acute COVID viral infection and hypoxia HX of severe schizoaffective disorder   Repeat head CT was negative Continue PAD protocol - dilaudid, propofol  Continue home medications include Xanax, Cogentin, Wellbutrin, Buspar, Celexa, and Risperadal EEG was negative for seizures  Complete heart block S/P pacemaker Continuous telemetry  Supportive care   Acute Kidney Injury - Baseline creatinine 0.8  Trend Bmet Avoid nephrotoxins Ensure adequate renal perfusion   Urinary retention with BPH  Continue home Flomax once enteral access is established   Best practice (evaluated daily)  Diet: NPO/tube feeding Pain/Anxiety/Delirium protocol (if indicated): dilaudid, propofol VAP protocol (if indicated): N/A DVT prophylaxis: Lovenox  GI prophylaxis: PPI Glucose control: SSI Mobility: Bedrest  last date of multidisciplinary goals of care  discussion: 12/6 Family and staff present: self and wife via phone Summary of discussion: full code with aggressive support Follow up goals of care discussion due:  12/11 Code Status: Full Disposition: ICU   Labs   CBC: Recent Labs  Lab Nov 25, 2020 0234  25-Nov-2020 0727 11/03/20 0606 11/04/20 0758 11/05/20 0451 11/05/20 1517 11/06/20 0645 11/07/20 0330 11/07/20 0914 11/08/20 0321  WBC 5.3 4.9   < > 7.2 16.6*  --  12.1* 10.9*  --  13.6*  NEUTROABS 4.5 3.6  --   --   --   --   --   --   --   --   HGB 14.2 14.1   < > 15.0 14.9 13.3 12.9* 13.1 12.2* 12.4*  HCT 43.3 44.2   < > 45.8 44.6 39.0 39.7 38.4* 36.0* 37.7*  MCV 87.1 88.9   < > 87.2 85.9  --  87.4 86.5  --  89.5  PLT 152 143*   < > 183 210  --  131* 174  --  150   < > = values in this interval not displayed.    Basic Metabolic Panel: Recent Labs  Lab 11/04/20 0758 11/05/20 0451 11/05/20 1517 11/06/20 0645 11/06/20 1115 11/06/20 1640 11/07/20 0330 11/07/20 0914 11/07/20 1700 11/08/20 0321  NA 144 146* 148* 143  --   --  147* 146*  --  144  K 5.1 4.2 4.3 4.0  --   --  4.0 4.0  --  4.5  CL 107 108  --  107  --   --  110  --   --  106  CO2 24 24  --  26  --   --  26  --   --  31  GLUCOSE 102* 106*  --  163*  --   --  111*  --   --  155*  BUN 16 26*  --  29*  --   --  32*  --   --  31*  CREATININE 0.96 1.31*  --  1.10  --   --  1.07  --   --  0.99  CALCIUM 8.5* 8.8*  --  8.0*  --   --  8.0*  --   --  7.4*  MG 2.1 2.1  --  2.3 2.5* 2.5* 2.1  --  2.0  --   PHOS  --   --   --   --  2.3* 2.6 1.7*  --  4.3  --    GFR: Estimated Creatinine Clearance: 82.8 mL/min (by C-G formula based on SCr of 0.99 mg/dL). Recent Labs  Lab 11/04/20 1513 11/05/20 0451 11/06/20 0645 11/07/20 0330 11/08/20 0321  PROCALCITON <0.10 0.66 0.70 0.47  --   WBC  --  16.6* 12.1* 10.9* 13.6*    Liver Function Tests: Recent Labs  Lab 11/04/20 0758 11/05/20 0451 11/06/20 0645 11/07/20 0330 11/08/20 0321  AST 223* 370* 215* 150* 111*  ALT 159* 153* 116* 105* 124*  ALKPHOS 55 67 59 66 65  BILITOT 1.4* 1.0 1.0 0.9 1.0  PROT 6.4* 6.5 5.7* 5.3* 5.2*  ALBUMIN 3.3* 3.3* 2.6* 2.4* 2.2*   No results for input(s): LIPASE, AMYLASE in the last 168 hours. Recent Labs  Lab 2020-11-25 0727  AMMONIA  33    ABG    Component Value Date/Time   PHART 7.326 (L) 11/08/2020 0620   PCO2ART 61.2 (H) 11/08/2020 0620   PO2ART 110 (H) 11/08/2020 0620   HCO3 31.1 (H)  11/08/2020 0620   TCO2 26 11/07/2020 0914   ACIDBASEDEF 3.0 (H) 09/20/2018 0411   O2SAT 97.3 11/08/2020 0620     Coagulation Profile: No results for input(s): INR, PROTIME in the last 168 hours.  Cardiac Enzymes: No results for input(s): CKTOTAL, CKMB, CKMBINDEX, TROPONINI in the last 168 hours.  HbA1C: No results found for: HGBA1C  CBG: Recent Labs  Lab 11/07/20 1145 11/07/20 1612 11/07/20 1934 11/08/20 0315 11/08/20 0749  GLUCAP 128* 140* 154* 143* 142*    Total critical care time: 32  minutes  Dirk Dress, NP Pulmonary/Critical Care Medicine  11/08/2020  9:11 AM

## 2020-11-09 ENCOUNTER — Inpatient Hospital Stay (HOSPITAL_COMMUNITY): Payer: Medicaid Other

## 2020-11-09 DIAGNOSIS — G9340 Encephalopathy, unspecified: Secondary | ICD-10-CM

## 2020-11-09 LAB — GLUCOSE, CAPILLARY
Glucose-Capillary: 103 mg/dL — ABNORMAL HIGH (ref 70–99)
Glucose-Capillary: 109 mg/dL — ABNORMAL HIGH (ref 70–99)
Glucose-Capillary: 119 mg/dL — ABNORMAL HIGH (ref 70–99)
Glucose-Capillary: 123 mg/dL — ABNORMAL HIGH (ref 70–99)
Glucose-Capillary: 130 mg/dL — ABNORMAL HIGH (ref 70–99)
Glucose-Capillary: 140 mg/dL — ABNORMAL HIGH (ref 70–99)

## 2020-11-09 LAB — BASIC METABOLIC PANEL
Anion gap: 11 (ref 5–15)
BUN: 25 mg/dL — ABNORMAL HIGH (ref 8–23)
CO2: 28 mmol/L (ref 22–32)
Calcium: 7.6 mg/dL — ABNORMAL LOW (ref 8.9–10.3)
Chloride: 106 mmol/L (ref 98–111)
Creatinine, Ser: 0.79 mg/dL (ref 0.61–1.24)
GFR, Estimated: >60 mL/min (ref >60)
Glucose, Bld: 116 mg/dL — ABNORMAL HIGH (ref 70–99)
Potassium: 4.3 mmol/L (ref 3.5–5.1)
Sodium: 145 mmol/L (ref 135–145)

## 2020-11-09 LAB — POCT I-STAT 7, (LYTES, BLD GAS, ICA,H+H)
Acid-Base Excess: 8 mmol/L — ABNORMAL HIGH (ref 0.0–2.0)
Acid-Base Excess: 8 mmol/L — ABNORMAL HIGH (ref 0.0–2.0)
Bicarbonate: 33.5 mmol/L — ABNORMAL HIGH (ref 20.0–28.0)
Bicarbonate: 34.3 mmol/L — ABNORMAL HIGH (ref 20.0–28.0)
Calcium, Ion: 1.12 mmol/L — ABNORMAL LOW (ref 1.15–1.40)
Calcium, Ion: 1.11 mmol/L — ABNORMAL LOW (ref 1.15–1.40)
HCT: 34 % — ABNORMAL LOW (ref 39.0–52.0)
HCT: 37 % — ABNORMAL LOW (ref 39.0–52.0)
Hemoglobin: 11.6 g/dL — ABNORMAL LOW (ref 13.0–17.0)
Hemoglobin: 12.6 g/dL — ABNORMAL LOW (ref 13.0–17.0)
O2 Saturation: 87 %
O2 Saturation: 91 %
Patient temperature: 99.9
Patient temperature: 99.9
Potassium: 3.9 mmol/L (ref 3.5–5.1)
Potassium: 3.8 mmol/L (ref 3.5–5.1)
Sodium: 143 mmol/L (ref 135–145)
Sodium: 145 mmol/L (ref 135–145)
TCO2: 35 mmol/L — ABNORMAL HIGH (ref 22–32)
TCO2: 36 mmol/L — ABNORMAL HIGH (ref 22–32)
pCO2 arterial: 52.4 mmHg — ABNORMAL HIGH (ref 32.0–48.0)
pCO2 arterial: 54.8 mmHg — ABNORMAL HIGH (ref 32.0–48.0)
pH, Arterial: 7.417 (ref 7.350–7.450)
pH, Arterial: 7.407 (ref 7.350–7.450)
pO2, Arterial: 56 mmHg — ABNORMAL LOW (ref 83.0–108.0)
pO2, Arterial: 65 mmHg — ABNORMAL LOW (ref 83.0–108.0)

## 2020-11-09 LAB — CBC
HCT: 39.4 % (ref 39.0–52.0)
Hemoglobin: 12.4 g/dL — ABNORMAL LOW (ref 13.0–17.0)
MCH: 28.6 pg (ref 26.0–34.0)
MCHC: 31.5 g/dL (ref 30.0–36.0)
MCV: 91 fL (ref 80.0–100.0)
Platelets: UNDETERMINED 10*3/uL (ref 150–400)
RBC: 4.33 MIL/uL (ref 4.22–5.81)
RDW: 14.6 % (ref 11.5–15.5)
WBC: 17.7 10*3/uL — ABNORMAL HIGH (ref 4.0–10.5)
nRBC: 0 % (ref 0.0–0.2)

## 2020-11-09 LAB — HEPARIN LEVEL (UNFRACTIONATED): Heparin Unfractionated: 0.58 IU/mL (ref 0.30–0.70)

## 2020-11-09 MED ORDER — ARTIFICIAL TEARS OPHTHALMIC OINT
1.0000 "application " | TOPICAL_OINTMENT | Freq: Three times a day (TID) | OPHTHALMIC | Status: DC
  Administered 2020-11-09 – 2020-11-12 (×10): 1 via OPHTHALMIC
  Filled 2020-11-09 (×2): qty 3.5

## 2020-11-09 MED ORDER — CHLORHEXIDINE GLUCONATE 0.12% ORAL RINSE (MEDLINE KIT): 15.0000 mL | Freq: Two times a day (BID) | OROMUCOSAL | Status: DC

## 2020-11-09 MED ORDER — BISACODYL 10 MG RE SUPP: 10.0000 mg | Freq: Every day | RECTAL | Status: DC | PRN

## 2020-11-09 MED ORDER — HYDROMORPHONE HCL 1 MG/ML IJ SOLN
1.0000 mg | Freq: Once | INTRAMUSCULAR | Status: AC
  Administered 2020-11-09: 1 mg via INTRAVENOUS
  Filled 2020-11-09: qty 1

## 2020-11-09 MED ORDER — DOCUSATE SODIUM 50 MG/5ML PO LIQD
100.0000 mg | Freq: Two times a day (BID) | ORAL | Status: DC
  Administered 2020-11-09 – 2020-11-12 (×9): 100 mg
  Filled 2020-11-09 (×8): qty 10

## 2020-11-09 MED ORDER — ORAL CARE MOUTH RINSE: 15.0000 mL | OROMUCOSAL | Status: DC

## 2020-11-09 MED ORDER — HYDROMORPHONE BOLUS VIA INFUSION
0.5000 mg | INTRAVENOUS | Status: DC | PRN
  Filled 2020-11-09: qty 1

## 2020-11-09 MED ORDER — VECURONIUM BROMIDE 10 MG IV SOLR
0.1000 mg/kg | INTRAVENOUS | Status: DC | PRN
  Administered 2020-11-09 – 2020-11-12 (×4): 8.9 mg via INTRAVENOUS
  Filled 2020-11-09 (×4): qty 10

## 2020-11-09 MED ORDER — FUROSEMIDE 10 MG/ML IJ SOLN
40.0000 mg | Freq: Once | INTRAMUSCULAR | Status: AC
  Administered 2020-11-09: 40 mg via INTRAVENOUS
  Filled 2020-11-09: qty 4

## 2020-11-09 MED ORDER — SODIUM CHLORIDE 0.9 % IV SOLN
0.5000 mg/h | INTRAVENOUS | Status: DC
  Administered 2020-11-10: 06:00:00 2 mg/h via INTRAVENOUS
  Administered 2020-11-11 (×2): 4 mg/h via INTRAVENOUS
  Administered 2020-11-11: 5 mg/h via INTRAVENOUS
  Administered 2020-11-12: 13:00:00 4 mg/h via INTRAVENOUS
  Filled 2020-11-09 (×8): qty 5

## 2020-11-09 MED ORDER — BISACODYL 5 MG PO TBEC
5.0000 mg | DELAYED_RELEASE_TABLET | Freq: Every day | ORAL | Status: DC | PRN
  Administered 2020-11-09: 5 mg via ORAL
  Filled 2020-11-09: qty 1

## 2020-11-09 NOTE — Progress Notes (Signed)
Pt proned with no complications. ETT taped in proper position to the left with mepilex in place to protect his face.

## 2020-11-09 NOTE — Progress Notes (Signed)
Pts head turned to left side by RN and RT without any complications. ETT Tube secured.

## 2020-11-09 NOTE — Progress Notes (Signed)
ANTICOAGULATION CONSULT NOTE - Initial Consult  Pharmacy Consult for heparin  Indication: DVT  Allergies  Allergen Reactions  . Duragesic-100 [Fentanyl] Other (See Comments)    Pt became violent    Patient Measurements: Height: 5\' 9"  (175.3 cm) Weight: 89.2 kg (196 lb 10.4 oz) IBW/kg (Calculated) : 70.7 Heparin Dosing Weight: 88 kg  Vital Signs: Temp: 98.2 F (36.8 C) (12/10 0255) Temp Source: Oral (12/10 0255) BP: 117/59 (12/10 0726) Pulse Rate: 78 (12/10 0726)  Labs: Recent Labs    11/07/20 0330 11/07/20 0914 11/08/20 0321 11/08/20 1146 11/08/20 2333 11/09/20 0611  HGB 13.1   < > 12.4* 11.9*  --  12.4*  HCT 38.4*   < > 37.7* 35.0*  --  39.4  PLT 174  --  150  --   --  PLATELET CLUMPS NOTED ON SMEAR, UNABLE TO ESTIMATE  HEPARINUNFRC  --   --   --   --  0.97* 0.58  CREATININE 1.07  --  0.99  --   --  0.79   < > = values in this interval not displayed.    Estimated Creatinine Clearance: 103 mL/min (by C-G formula based on SCr of 0.79 mg/dL).  Assessment: 64 yo m presenting with COVID PNA - ddimer increased to > 20 this am  LE dopplers obtained showing BL DVTs  Cbc stable - hep lvl within goal  Goal of Therapy:  Heparin level 0.3-0.7 units/ml Monitor platelets by anticoagulation protocol: Yes   Plan:  Heparin gtt 1500 units/hr Daily hep lvl cbc  77, PharmD, BCPS, BCCCP Clinical Pharmacist [phone]  Please check AMION for all [facility] Pharmacy numbers  11/09/2020 8:22 AM

## 2020-11-09 NOTE — Progress Notes (Signed)
ANTICOAGULATION CONSULT NOTE  Pharmacy Consult for heparin  Indication: DVT  Allergies  Allergen Reactions  . Duragesic-100 [Fentanyl] Other (See Comments)    Pt became violent    Patient Measurements: Height: 5\' 9"  (175.3 cm) Weight: 88.3 kg (194 lb 10.7 oz) IBW/kg (Calculated) : 70.7 Heparin Dosing Weight: 88 kg  Vital Signs: Temp: 98.2 F (36.8 C) (12/09 2305) Temp Source: Oral (12/09 2305) BP: 115/65 (12/09 2300) Pulse Rate: 62 (12/09 2315)  Labs: Recent Labs    11/06/20 0645 11/07/20 0330 11/07/20 0914 11/08/20 0321 11/08/20 1146 11/08/20 2333  HGB 12.9* 13.1 12.2* 12.4* 11.9*  --   HCT 39.7 38.4* 36.0* 37.7* 35.0*  --   PLT 131* 174  --  150  --   --   HEPARINUNFRC  --   --   --   --   --  0.97*  CREATININE 1.10 1.07  --  0.99  --   --     Estimated Creatinine Clearance: 82.8 mL/min (by C-G formula based on SCr of 0.99 mg/dL).  Assessment: 64 yo m presenting with COVID PNA - ddimer increased to > 20 this am  LE dopplers obtained showing BL DVTs  Initial heparin level 0.97 units/ml  Goal of Therapy:  Heparin level 0.3-0.7 units/ml Monitor platelets by anticoagulation protocol: Yes   Plan:  Decrease heparin gtt 1300 units/hr Check heparin level in 6-8 hours Daily hep lvl cbc  Thanks for allowing pharmacy to be a part of this patient's care.  77, PharmD Clinical Pharmacist

## 2020-11-09 NOTE — Progress Notes (Signed)
eLink Physician-Brief Progress Note Patient Name: Billy Simon DOB: [birth year] MRN: [id number]   Date of Service  11/09/2020  HPI/Events of Note  Patient has not had a bowel movement in 5 days .  eICU Interventions  Stat KUB r/o ileus. If KUB within normal limits will begin a bowel regimen with Colace, Dulcolax tabs and PRN Dulcolax suppositories.        Thomasene Lot Ramaj Frangos 11/09/2020, 1:46 AM

## 2020-11-09 NOTE — Progress Notes (Signed)
NAME:  Billy Simon, MRN:  [id number], DOB:  1956/10/06, LOS: 7 ADMISSION DATE:  11/16/2020, CONSULTATION DATE:  12/6 REFERRING MD:  Dr Thedore Mins, CHIEF COMPLAINT:  AMS    Brief History   64yo male presented with known diagnosis of COVID now with worsening confusion. PCCM consulted 12/6 for worsening confusion with concern for developing sepsis due to aspiration pneumonia requiring intubation and mechanical ventilation  Past Medical History  Severe schizoaffective disorder, complete heart block S/P pacemaker, anxiety, depression, and syncope  Significant Hospital Events   Admitted 12/3  Consults:  PCCM  Procedures:  12/6 ETT 12/6 RIJ  Significant Diagnostic Tests:   Head CT 12/3 > Negative   CXR 12/6 > Bilateral pulmonary opacities with improved right lung aeration.  Micro Data:  COVID 12/3 > positive  Blood culture 12/6 >few GPC, few yeast>>>  Antimicrobials:  Remdesivir 12/4 > Unasyn 12/6>>>  Interim history/subjective:  Responded to proning with improved P:F ratio. However worsening oxygenation this am.   Objective   Blood pressure (!) 117/59, pulse 78, temperature (!) 101.4 F (38.6 C), temperature source Axillary, resp. rate (!) 24, height 5\' 9"  (1.753 m), weight 89.2 kg, SpO2 90 %.    Vent Mode: PRVC FiO2 (%):  [50 %-70 %] 70 % Set Rate:  [24 bmp] 24 bmp Vt Set:  [560 mL] 560 mL PEEP:  [10 cmH20-14 cmH20] 14 cmH20 Plateau Pressure:  [22 cmH20-24 cmH20] 23 cmH20   Intake/Output Summary (Last 24 hours) at 11/09/2020 1313 Last data filed at 11/09/2020 1116 Gross per 24 hour  Intake 1871.19 ml  Output 2050 ml  Net -178.81 ml   Filed Weights   11/07/20 0455 11/08/20 0345 11/09/20 0500  Weight: 89.2 kg 88.3 kg 89.2 kg    Examination: General: adult male, NAD  HEENT: ETT, mm moist, no JVD  Neuro: sedated, RASS-2, pupils are 2 mm, minimally reactive, not following commands.  CV: s1s2 paced and tachycardic, no murmur, rubs, or gallops,  PULM: resps  even non labored on vent, coarse L>R GI: soft, bowel sounds active in all 4 quadrants, non-tender, non-distended Extremities: warm/dry Skin: no rashes or lesions   Resolved Hospital Problem list     Assessment & Plan:  Acute hypoxic/hypercapnic respiratory failure due to ARDS from COVID-19 pneumonia Complicated with multifocal aspiration pneumonia -S/p remdesivir x 5d Plan Full vent mechanical ventilation Continue proning for P:F ratio <150. Reprone today with paralytic protocol ABG PRN Continue Unasyn 5 out of 7 days  Acute metabolic encephalopathy in the setting of acute COVID viral infection and hypoxia Hx of severe schizoaffective disorder  Repeat head CT was negative Continue PAD protocol - dilaudid, propofol  Continue home medications include Xanax, Cogentin, Wellbutrin, Buspar, Celexa, and Risperadal EEG was negative for seizures  Bilateral DVTs Heparin gtt  Complete heart block S/P pacemaker Continuous telemetry  Supportive care   Acute Kidney Injury - Baseline creatinine 0.8 Trend Bmet Avoid nephrotoxins Ensure adequate renal perfusion   Urinary retention with BPH  Continue home Flomax once enteral access is established   Best practice (evaluated daily)  Diet: NPO/tube feeding Pain/Anxiety/Delirium protocol (if indicated): dilaudid, propofol VAP protocol (if indicated): N/A DVT prophylaxis: Lovenox  GI prophylaxis: PPI Glucose control: SSI Mobility: Bedrest  last date of multidisciplinary goals of care discussion: 12/6 Family and staff present: self and wife via phone  Summary of discussion: full code with aggressive support Follow up goals of care discussion due:  12/11 Daily update on 11/09/20 with wife  Code  Status: Full Disposition: ICU   Labs   CBC: Recent Labs  Lab 11/05/20 0451 11/05/20 1517 11/06/20 0645 11/07/20 0330 11/07/20 0914 11/08/20 0321 11/08/20 1146 11/09/20 0611 11/09/20 0854 11/09/20 1143  WBC 16.6*  --  12.1* 10.9*   --  13.6*  --  17.7*  --   --   HGB 14.9   < > 12.9* 13.1   < > 12.4* 11.9* 12.4* 11.6* 12.6*  HCT 44.6   < > 39.7 38.4*   < > 37.7* 35.0* 39.4 34.0* 37.0*  MCV 85.9  --  87.4 86.5  --  89.5  --  91.0  --   --   PLT 210  --  131* 174  --  150  --  PLATELET CLUMPS NOTED ON SMEAR, UNABLE TO ESTIMATE  --   --    < > = values in this interval not displayed.    Basic Metabolic Panel: Recent Labs  Lab 11/05/20 0451 11/05/20 1517 11/06/20 0645 11/06/20 1115 11/06/20 1640 11/07/20 0330 11/07/20 0914 11/07/20 1700 11/08/20 0321 11/08/20 1146 11/09/20 0611 11/09/20 0854 11/09/20 1143  NA 146*   < > 143  --   --  147*   < >  --  144 145 145 143 145  K 4.2   < > 4.0  --   --  4.0   < >  --  4.5 4.3 4.3 3.9 3.8  CL 108  --  107  --   --  110  --   --  106  --  106  --   --   CO2 24  --  26  --   --  26  --   --  31  --  28  --   --   GLUCOSE 106*  --  163*  --   --  111*  --   --  155*  --  116*  --   --   BUN 26*  --  29*  --   --  32*  --   --  31*  --  25*  --   --   CREATININE 1.31*  --  1.10  --   --  1.07  --   --  0.99  --  0.79  --   --   CALCIUM 8.8*  --  8.0*  --   --  8.0*  --   --  7.4*  --  7.6*  --   --   MG 2.1  --  2.3 2.5* 2.5* 2.1  --  2.0  --   --   --   --   --   PHOS  --   --   --  2.3* 2.6 1.7*  --  4.3  --   --   --   --   --    < > = values in this interval not displayed.   GFR: Estimated Creatinine Clearance: 103 mL/min (by C-G formula based on SCr of 0.79 mg/dL). Recent Labs  Lab 11/04/20 1513 11/05/20 0451 11/06/20 0645 11/07/20 0330 11/08/20 0321 11/09/20 0611  PROCALCITON <0.10 0.66 0.70 0.47  --   --   WBC  --  16.6* 12.1* 10.9* 13.6* 17.7*    Liver Function Tests: Recent Labs  Lab 11/04/20 0758 11/05/20 0451 11/06/20 0645 11/07/20 0330 11/08/20 0321  AST 223* 370* 215* 150* 111*  ALT 159* 153* 116* 105* 124*  ALKPHOS 55 67 59 66 65  BILITOT 1.4* 1.0 1.0 0.9 1.0  PROT 6.4* 6.5 5.7* 5.3* 5.2*  ALBUMIN 3.3* 3.3* 2.6* 2.4* 2.2*   No  results for input(s): LIPASE, AMYLASE in the last 168 hours. No results for input(s): AMMONIA in the last 168 hours.  ABG    Component Value Date/Time   PHART 7.407 11/09/2020 1143   PCO2ART 54.8 (H) 11/09/2020 1143   PO2ART 65 (L) 11/09/2020 1143   HCO3 34.3 (H) 11/09/2020 1143   TCO2 36 (H) 11/09/2020 1143   ACIDBASEDEF 3.0 (H) 09/20/2018 0411   O2SAT 91.0 11/09/2020 1143     Coagulation Profile: No results for input(s): INR, PROTIME in the last 168 hours.  Cardiac Enzymes: No results for input(s): CKTOTAL, CKMB, CKMBINDEX, TROPONINI in the last 168 hours.  HbA1C: No results found for: HGBA1C  CBG: Recent Labs  Lab 11/08/20 1942 11/08/20 2305 11/09/20 0255 11/09/20 0806 11/09/20 1150  GLUCAP 174* 156* 130* 103* 109*    The patient is critically ill with multiple organ systems failure and requires high complexity decision making for assessment and support, frequent evaluation and titration of therapies, application of advanced monitoring technologies and extensive interpretation of multiple databases.  Independent Critical Care Time: 40 Minutes.   Mechele Collin, M.D. [facility] Pulmonary/Critical Care Medicine 11/09/2020 1:13 PM   Please see Amion for pager number to reach on-call Pulmonary and Critical Care Team.

## 2020-11-10 LAB — BASIC METABOLIC PANEL
Anion gap: 12 (ref 5–15)
BUN: 28 mg/dL — ABNORMAL HIGH (ref 8–23)
CO2: 31 mmol/L (ref 22–32)
Calcium: 8 mg/dL — ABNORMAL LOW (ref 8.9–10.3)
Chloride: 101 mmol/L (ref 98–111)
Creatinine, Ser: 1.03 mg/dL (ref 0.61–1.24)
GFR, Estimated: >60 mL/min (ref >60)
Glucose, Bld: 121 mg/dL — ABNORMAL HIGH (ref 70–99)
Potassium: 4.1 mmol/L (ref 3.5–5.1)
Sodium: 144 mmol/L (ref 135–145)

## 2020-11-10 LAB — POCT I-STAT 7, (LYTES, BLD GAS, ICA,H+H)
Acid-Base Excess: 7 mmol/L — ABNORMAL HIGH (ref 0.0–2.0)
Bicarbonate: 34 mmol/L — ABNORMAL HIGH (ref 20.0–28.0)
Calcium, Ion: 1.12 mmol/L — ABNORMAL LOW (ref 1.15–1.40)
HCT: 35 % — ABNORMAL LOW (ref 39.0–52.0)
Hemoglobin: 11.9 g/dL — ABNORMAL LOW (ref 13.0–17.0)
O2 Saturation: 96 %
Potassium: 4 mmol/L (ref 3.5–5.1)
Sodium: 142 mmol/L (ref 135–145)
TCO2: 36 mmol/L — ABNORMAL HIGH (ref 22–32)
pCO2 arterial: 58.1 mmHg — ABNORMAL HIGH (ref 32.0–48.0)
pH, Arterial: 7.376 (ref 7.350–7.450)
pO2, Arterial: 86 mmHg (ref 83.0–108.0)

## 2020-11-10 LAB — CBC
HCT: 34.7 % — ABNORMAL LOW (ref 39.0–52.0)
Hemoglobin: 11.2 g/dL — ABNORMAL LOW (ref 13.0–17.0)
MCH: 29.3 pg (ref 26.0–34.0)
MCHC: 32.3 g/dL (ref 30.0–36.0)
MCV: 90.8 fL (ref 80.0–100.0)
Platelets: 195 10*3/uL (ref 150–400)
RBC: 3.82 MIL/uL — ABNORMAL LOW (ref 4.22–5.81)
RDW: 14.9 % (ref 11.5–15.5)
WBC: 14.3 10*3/uL — ABNORMAL HIGH (ref 4.0–10.5)
nRBC: 0 % (ref 0.0–0.2)

## 2020-11-10 LAB — CULTURE, BLOOD (ROUTINE X 2)
Culture: NO GROWTH
Culture: NO GROWTH
Special Requests: ADEQUATE
Special Requests: ADEQUATE

## 2020-11-10 LAB — GLUCOSE, CAPILLARY
Glucose-Capillary: 120 mg/dL — ABNORMAL HIGH (ref 70–99)
Glucose-Capillary: 106 mg/dL — ABNORMAL HIGH (ref 70–99)
Glucose-Capillary: 98 mg/dL (ref 70–99)
Glucose-Capillary: 120 mg/dL — ABNORMAL HIGH (ref 70–99)
Glucose-Capillary: 109 mg/dL — ABNORMAL HIGH (ref 70–99)
Glucose-Capillary: 108 mg/dL — ABNORMAL HIGH (ref 70–99)

## 2020-11-10 LAB — HEPARIN LEVEL (UNFRACTIONATED)
Heparin Unfractionated: 0.86 IU/mL — ABNORMAL HIGH (ref 0.30–0.70)
Heparin Unfractionated: 0.68 IU/mL (ref 0.30–0.70)

## 2020-11-10 LAB — TROPONIN I (HIGH SENSITIVITY): Troponin I (High Sensitivity): 36 ng/L — ABNORMAL HIGH (ref <18)

## 2020-11-10 LAB — TRIGLYCERIDES: Triglycerides: 75 mg/dL (ref <150)

## 2020-11-10 LAB — MAGNESIUM: Magnesium: 1.6 mg/dL — ABNORMAL LOW (ref 1.7–2.4)

## 2020-11-10 NOTE — Progress Notes (Signed)
Patients head turned to the left side without any complications. ETT tube secure.

## 2020-11-10 NOTE — Progress Notes (Signed)
Commercial tube holder removed, Mepilex tape placed on the cheeks and lips no breakdown noted. Secured ETT tube with cloth tape to the left side. Pt proned with the help of RT x 2 and RN x 3 without any complications. Pts head turned to the right side.

## 2020-11-10 NOTE — Progress Notes (Signed)
Turned pts head to the left side without any complications. ETT tube is secured.

## 2020-11-10 NOTE — Progress Notes (Signed)
Pt unproned at this time with no complications. Small abrasions noted to left ear and covered with mepilex. Significant swelling noted to both eyes. MD and RN aware. ET secured in proper position with commercial tube holder.

## 2020-11-10 NOTE — Progress Notes (Addendum)
ANTICOAGULATION CONSULT NOTE - Initial Consult  Pharmacy Consult for heparin  Indication: DVT  Allergies  Allergen Reactions  . Duragesic-100 [Fentanyl] Other (See Comments)    Pt became violent    Patient Measurements: Height: 5\' 9"  (175.3 cm) Weight: 89.2 kg (196 lb 10.4 oz) IBW/kg (Calculated) : 70.7 Heparin Dosing Weight: 88 kg  Vital Signs: Temp: 97.7 F (36.5 C) (12/11 1200) Temp Source: Axillary (12/11 1200) BP: 115/63 (12/11 0727) Pulse Rate: 85 (12/11 0727)  Labs: Recent Labs    11/08/20 0321 11/08/20 1146 11/09/20 0611 11/09/20 0854 11/09/20 1143 11/10/20 0435 11/10/20 0436 11/10/20 1305  HGB 12.4*   < > 12.4* 11.6* 12.6* 11.2*  --   --   HCT 37.7*   < > 39.4 34.0* 37.0* 34.7*  --   --   PLT 150  --  PLATELET CLUMPS NOTED ON SMEAR, UNABLE TO ESTIMATE  --   --  195  --   --   HEPARINUNFRC  --    < > 0.58  --   --   --  0.86* 0.68  CREATININE 0.99  --  0.79  --   --   --   --  1.03   < > = values in this interval not displayed.    Estimated Creatinine Clearance: 80 mL/min (by C-G formula based on SCr of 1.03 mg/dL).  Assessment: 64 yo m presenting with COVID PNA - ddimer increased to > 20 this am  LE dopplers obtained showing BL DVTs  HL 0.86, drew from central line, RN flushed but didn't hold heparin so not sure this is accurate, ask lab to draw peripherallyHL 0.86, drew from central line, RN flushed but didn't hold heparin so not sure this is accurate, ask lab to draw peripherally  Peripheral draw came back at 0.68 which is therapeutic  Goal of Therapy:  Heparin level 0.3-0.7 units/ml Monitor platelets by anticoagulation protocol: Yes   Plan:  Decrease Heparin gtt 1250 units/hr Daily hep lvl cbc  77, PharmD, [facility] Clinical Pharmacist Please see AMION for all Pharmacists' Contact Phone Numbers 11/10/2020, 2:30 PM    Please check AMION for all [facility] Pharmacy numbers  11/10/2020 2:28 PM

## 2020-11-10 NOTE — Progress Notes (Signed)
Patients head turned to the right side with RN without any complications. ETT tube is secure.

## 2020-11-10 NOTE — Progress Notes (Signed)
NAME:  Billy Simon, MRN:  [id number], DOB:  [birth year], LOS: 8 ADMISSION DATE:  11/11/2020, CONSULTATION DATE:  12/6 REFERRING MD:  Dr Thedore Mins, CHIEF COMPLAINT:  AMS    Brief History   64yo male presented with known diagnosis of COVID now with worsening confusion. PCCM consulted 12/6 for worsening confusion with concern for developing sepsis due to aspiration pneumonia requiring intubation and mechanical ventilation  Past Medical History  Severe schizoaffective disorder, complete heart block S/P pacemaker, anxiety, depression, and syncope  Significant Hospital Events   Admitted 12/3  Consults:  PCCM  Procedures:  12/6 ETT 12/6 RIJ  Significant Diagnostic Tests:   Head CT 12/3 > Negative   CXR 12/6 > Bilateral pulmonary opacities with improved right lung aeration.  Micro Data:  COVID 12/3 > positive  Blood culture 12/6 >few GPC, few yeast>>>  Antimicrobials:  Remdesivir 12/4 > Unasyn 12/6>>>  Interim history/subjective:  Proned  Objective   Blood pressure 115/63, pulse 85, temperature 98.9 F (37.2 C), temperature source Axillary, resp. rate (!) 24, height 5\' 9"  (1.753 m), weight 89.2 kg, SpO2 99 %.    Vent Mode: PRVC FiO2 (%):  [70 %-100 %] 90 % Set Rate:  [24 bmp] 24 bmp Vt Set:  [560 mL] 560 mL PEEP:  [14 cmH20] 14 cmH20 Plateau Pressure:  [18 cmH20-30 cmH20] 26 cmH20   Intake/Output Summary (Last 24 hours) at 11/10/2020 1021 Last data filed at 11/10/2020 0432 Gross per 24 hour  Intake 1363.05 ml  Output 1755 ml  Net -391.95 ml   Filed Weights   11/08/20 0345 11/09/20 0500 11/10/20 0433  Weight: 88.3 kg 89.2 kg 89.2 kg   Physical Exam: General: Chronically ill-appearing, sedated, proned HENT: [place], AT, ETT in place Eyes: EOMI, no scleral icterus Respiratory: Diminished breath sounds bilaterally.  No crackles, wheezing or rales Cardiovascular: Distant RRR, -M/R/G, no JVD GI: BS+, soft, nontender Extremities:-Edema,-tenderness Neuro:  Sedated  Resolved Hospital Problem list     Assessment & Plan:  Acute hypoxic/hypercapnic respiratory failure due to ARDS from COVID-19 pneumonia Complicated with multifocal aspiration pneumonia -S/p remdesivir x 5d Plan Full vent mechanical ventilation Continue proning for P:F ratio <150. Reprone today with intermittent paralytic protocol. Return to supine this morning ABG 1 hour post supine Continue Unasyn 5 out of 7 days PAD protocol for goal RASS -5 VAP  Acute metabolic encephalopathy in the setting of acute COVID viral infection and hypoxia Hx of severe schizoaffective disorder  Repeat head CT was negative Continue PAD protocol - dilaudid, propofol  Continue home medications include Xanax, Cogentin, Wellbutrin, Buspar, Celexa, and Risperadal EEG was negative for seizures  Bilateral DVTs Heparin gtt  Complete heart block S/P pacemaker Continuous telemetry  Supportive care   Acute Kidney Injury - Baseline creatinine 0.8 Trend Bmet Avoid nephrotoxins Ensure adequate renal perfusion   Urinary retention with BPH  Foley in place  Best practice (evaluated daily)  Diet: NPO/tube feeding Pain/Anxiety/Delirium protocol (if indicated): dilaudid, propofol VAP protocol (if indicated): N/A DVT prophylaxis: Lovenox  GI prophylaxis: PPI Glucose control: SSI Mobility: Bedrest  last date of multidisciplinary goals of care discussion: 12/6 Family and staff present: self and wife via phone  Summary of discussion: full code with aggressive support Follow up goals of care discussion due:  12/11 Daily update on 11/09/20 with wife  Code Status: Full Disposition: ICU   Labs   CBC: Recent Labs  Lab 11/06/20 0645 11/07/20 0330 11/07/20 0914 11/08/20 0321 11/08/20 1146 11/09/20 0611 11/09/20 0854 11/09/20  1143 11/10/20 0435  WBC 12.1* 10.9*  --  13.6*  --  17.7*  --   --  14.3*  HGB 12.9* 13.1   < > 12.4* 11.9* 12.4* 11.6* 12.6* 11.2*  HCT 39.7 38.4*   < > 37.7* 35.0*  39.4 34.0* 37.0* 34.7*  MCV 87.4 86.5  --  89.5  --  91.0  --   --  90.8  PLT 131* 174  --  150  --  PLATELET CLUMPS NOTED ON SMEAR, UNABLE TO ESTIMATE  --   --  195   < > = values in this interval not displayed.    Basic Metabolic Panel: Recent Labs  Lab 11/05/20 0451 11/05/20 1517 11/06/20 0645 11/06/20 1115 11/06/20 1640 11/07/20 0330 11/07/20 0914 11/07/20 1700 11/08/20 0321 11/08/20 1146 11/09/20 0611 11/09/20 0854 11/09/20 1143  NA 146*   < > 143  --   --  147*   < >  --  144 145 145 143 145  K 4.2   < > 4.0  --   --  4.0   < >  --  4.5 4.3 4.3 3.9 3.8  CL 108  --  107  --   --  110  --   --  106  --  106  --   --   CO2 24  --  26  --   --  26  --   --  31  --  28  --   --   GLUCOSE 106*  --  163*  --   --  111*  --   --  155*  --  116*  --   --   BUN 26*  --  29*  --   --  32*  --   --  31*  --  25*  --   --   CREATININE 1.31*  --  1.10  --   --  1.07  --   --  0.99  --  0.79  --   --   CALCIUM 8.8*  --  8.0*  --   --  8.0*  --   --  7.4*  --  7.6*  --   --   MG 2.1  --  2.3 2.5* 2.5* 2.1  --  2.0  --   --   --   --   --   PHOS  --   --   --  2.3* 2.6 1.7*  --  4.3  --   --   --   --   --    < > = values in this interval not displayed.   GFR: Estimated Creatinine Clearance: 103 mL/min (by C-G formula based on SCr of 0.79 mg/dL). Recent Labs  Lab 11/04/20 1513 11/05/20 0451 11/06/20 0645 11/07/20 0330 11/08/20 0321 11/09/20 0611 11/10/20 0435  PROCALCITON <0.10 0.66 0.70 0.47  --   --   --   WBC  --  16.6* 12.1* 10.9* 13.6* 17.7* 14.3*    Liver Function Tests: Recent Labs  Lab 11/04/20 0758 11/05/20 0451 11/06/20 0645 11/07/20 0330 11/08/20 0321  AST 223* 370* 215* 150* 111*  ALT 159* 153* 116* 105* 124*  ALKPHOS 55 67 59 66 65  BILITOT 1.4* 1.0 1.0 0.9 1.0  PROT 6.4* 6.5 5.7* 5.3* 5.2*  ALBUMIN 3.3* 3.3* 2.6* 2.4* 2.2*   No results for input(s): LIPASE, AMYLASE in the last 168 hours. No results for input(s): AMMONIA in the last  168  hours.  ABG    Component Value Date/Time   PHART 7.407 11/09/2020 1143   PCO2ART 54.8 (H) 11/09/2020 1143   PO2ART 65 (L) 11/09/2020 1143   HCO3 34.3 (H) 11/09/2020 1143   TCO2 36 (H) 11/09/2020 1143   ACIDBASEDEF 3.0 (H) 09/20/2018 0411   O2SAT 91.0 11/09/2020 1143     Coagulation Profile: No results for input(s): INR, PROTIME in the last 168 hours.  Cardiac Enzymes: No results for input(s): CKTOTAL, CKMB, CKMBINDEX, TROPONINI in the last 168 hours.  HbA1C: No results found for: HGBA1C  CBG: Recent Labs  Lab 11/09/20 1517 11/09/20 1930 11/09/20 2340 11/10/20 0356 11/10/20 0804  GLUCAP 119* 140* 123* 120* 106*    The patient is critically ill with multiple organ systems failure and requires high complexity decision making for assessment and support, frequent evaluation and titration of therapies, application of advanced monitoring technologies and extensive interpretation of multiple databases.  Independent Critical Care Time: 40 Minutes.   Mechele Collin, M.D. [facility] Pulmonary/Critical Care Medicine 11/10/2020 10:21 AM   Please see Amion for pager number to reach on-call Pulmonary and Critical Care Team.

## 2020-11-11 LAB — CBC
HCT: 34.7 % — ABNORMAL LOW (ref 39.0–52.0)
Hemoglobin: 10.5 g/dL — ABNORMAL LOW (ref 13.0–17.0)
MCH: 28 pg (ref 26.0–34.0)
MCHC: 30.3 g/dL (ref 30.0–36.0)
MCV: 92.5 fL (ref 80.0–100.0)
Platelets: UNDETERMINED 10*3/uL (ref 150–400)
RBC: 3.75 MIL/uL — ABNORMAL LOW (ref 4.22–5.81)
RDW: 14.9 % (ref 11.5–15.5)
WBC: 18.9 10*3/uL — ABNORMAL HIGH (ref 4.0–10.5)
nRBC: 0 % (ref 0.0–0.2)

## 2020-11-11 LAB — POCT I-STAT 7, (LYTES, BLD GAS, ICA,H+H)
Acid-Base Excess: 8 mmol/L — ABNORMAL HIGH (ref 0.0–2.0)
Acid-Base Excess: 2 mmol/L (ref 0.0–2.0)
Bicarbonate: 34.2 mmol/L — ABNORMAL HIGH (ref 20.0–28.0)
Bicarbonate: 30.3 mmol/L — ABNORMAL HIGH (ref 20.0–28.0)
Calcium, Ion: 1.05 mmol/L — ABNORMAL LOW (ref 1.15–1.40)
Calcium, Ion: 0.96 mmol/L — ABNORMAL LOW (ref 1.15–1.40)
HCT: 29 % — ABNORMAL LOW (ref 39.0–52.0)
HCT: 31 % — ABNORMAL LOW (ref 39.0–52.0)
Hemoglobin: 9.9 g/dL — ABNORMAL LOW (ref 13.0–17.0)
Hemoglobin: 10.5 g/dL — ABNORMAL LOW (ref 13.0–17.0)
O2 Saturation: 99 %
O2 Saturation: 98 %
Patient temperature: 98.4
Potassium: 4.5 mmol/L (ref 3.5–5.1)
Potassium: 5.7 mmol/L — ABNORMAL HIGH (ref 3.5–5.1)
Sodium: 135 mmol/L (ref 135–145)
Sodium: 131 mmol/L — ABNORMAL LOW (ref 135–145)
TCO2: 36 mmol/L — ABNORMAL HIGH (ref 22–32)
TCO2: 32 mmol/L (ref 22–32)
pCO2 arterial: 57.5 mmHg — ABNORMAL HIGH (ref 32.0–48.0)
pCO2 arterial: 65.5 mmHg (ref 32.0–48.0)
pH, Arterial: 7.382 (ref 7.350–7.450)
pH, Arterial: 7.274 — ABNORMAL LOW (ref 7.350–7.450)
pO2, Arterial: 154 mmHg — ABNORMAL HIGH (ref 83.0–108.0)
pO2, Arterial: 124 mmHg — ABNORMAL HIGH (ref 83.0–108.0)

## 2020-11-11 LAB — CULTURE, RESPIRATORY W GRAM STAIN

## 2020-11-11 LAB — GLUCOSE, CAPILLARY
Glucose-Capillary: 151 mg/dL — ABNORMAL HIGH (ref 70–99)
Glucose-Capillary: 138 mg/dL — ABNORMAL HIGH (ref 70–99)
Glucose-Capillary: 127 mg/dL — ABNORMAL HIGH (ref 70–99)
Glucose-Capillary: 190 mg/dL — ABNORMAL HIGH (ref 70–99)
Glucose-Capillary: 193 mg/dL — ABNORMAL HIGH (ref 70–99)
Glucose-Capillary: 169 mg/dL — ABNORMAL HIGH (ref 70–99)

## 2020-11-11 LAB — BASIC METABOLIC PANEL
Anion gap: 12 (ref 5–15)
BUN: 42 mg/dL — ABNORMAL HIGH (ref 8–23)
CO2: 31 mmol/L (ref 22–32)
Calcium: 7.6 mg/dL — ABNORMAL LOW (ref 8.9–10.3)
Chloride: 97 mmol/L — ABNORMAL LOW (ref 98–111)
Creatinine, Ser: 1.64 mg/dL — ABNORMAL HIGH (ref 0.61–1.24)
GFR, Estimated: 46 mL/min — ABNORMAL LOW (ref >60)
Glucose, Bld: 142 mg/dL — ABNORMAL HIGH (ref 70–99)
Potassium: 4.4 mmol/L (ref 3.5–5.1)
Sodium: 140 mmol/L (ref 135–145)

## 2020-11-11 LAB — HEPARIN LEVEL (UNFRACTIONATED): Heparin Unfractionated: 0.62 IU/mL (ref 0.30–0.70)

## 2020-11-11 MED ORDER — SODIUM ZIRCONIUM CYCLOSILICATE 10 G PO PACK
10.0000 g | PACK | Freq: Once | ORAL | Status: AC
  Administered 2020-11-11: 10 g
  Filled 2020-11-11: qty 1

## 2020-11-11 MED ORDER — VANCOMYCIN HCL 750 MG/150ML IV SOLN
750.0000 mg | Freq: Two times a day (BID) | INTRAVENOUS | Status: DC
  Administered 2020-11-11: 23:00:00 750 mg via INTRAVENOUS
  Filled 2020-11-11 (×2): qty 150

## 2020-11-11 MED ORDER — VANCOMYCIN HCL 1750 MG/350ML IV SOLN
1750.0000 mg | Freq: Once | INTRAVENOUS | Status: AC
  Administered 2020-11-11: 11:00:00 1750 mg via INTRAVENOUS
  Filled 2020-11-11: qty 350

## 2020-11-11 NOTE — Progress Notes (Signed)
Pt supined at this time with no complications. Small skin tear noted on left cheek and covered with mepilex. RN made aware. VS within normal limits.

## 2020-11-11 NOTE — Progress Notes (Signed)
Pharmacy Antibiotic Note  Billy Simon is a 64 y.o. male admitted on 11/30/2020 with pneumonia.  Pharmacy has been consulted for Vancomycin dosing. CrCl 50 ml/min  Plan: Vanc 1750 mg IV x 1, then 750 mg IV q12hr Monitor renal function, C&S, and vanc levels as needed  Height: 5\' 9"  (175.3 cm) Weight: 90.9 kg (200 lb 6.4 oz) IBW/kg (Calculated) : 70.7  Temp (24hrs), Avg:99.3 F (37.4 C), Min:97.7 F (36.5 C), Max:100.9 F (38.3 C)  Recent Labs  Lab 11/07/20 0330 11/08/20 0321 11/09/20 0611 11/10/20 0435 11/10/20 1305 11/11/20 0448  WBC 10.9* 13.6* 17.7* 14.3*  --  18.9*  CREATININE 1.07 0.99 0.79  --  1.03 1.64*    Estimated Creatinine Clearance: 50.7 mL/min (A) (by C-G formula based on SCr of 1.64 mg/dL (H)).    Allergies  Allergen Reactions  . Duragesic-100 [Fentanyl] Other (See Comments)    Pt became violent    Antimicrobials this admission: Vanc 12/12 >>   Microbiology results:  12/8 Sputum: abundant staph aureus - sens pending    Thank you for allowing pharmacy to be a part of this patient's care.  14/8, PharmD, [facility] Clinical Pharmacist Please see AMION for all Pharmacists' Contact Phone Numbers 11/11/2020, 9:35 AM

## 2020-11-11 NOTE — Progress Notes (Addendum)
NAME:  Billy Simon, MRN:  [id number], DOB:  [birth year], LOS: 9 ADMISSION DATE:  12-Nov-2020, CONSULTATION DATE:  12/6 REFERRING MD:  Dr Thedore Mins, CHIEF COMPLAINT:  AMS    Brief History   64yo male presented with known diagnosis of COVID now with worsening confusion. PCCM consulted 12/6 for worsening confusion with concern for developing sepsis due to aspiration pneumonia requiring intubation and mechanical ventilation  Past Medical History  Severe schizoaffective disorder, complete heart block S/P pacemaker, anxiety, depression, and syncope  Significant Hospital Events   12/3 Admitted to [facility] 12/6 PCCM consulted for AMS. Required intubation 12/8-current Proned  Consults:  PCCM  Procedures:  12/6 ETT 12/6 RIJ  Significant Diagnostic Tests:   Head CT 12/3 > Negative   CXR 12/6 > Bilateral pulmonary opacities with improved right lung aeration.  Micro Data:  COVID 12/3 > positive  Blood culture 12/6 >neg Trach culture 12/8> Abundant S.aureus. Susceptibilities pending  Antimicrobials:  Remdesivir 12/4 > Unasyn 12/6>>>  Interim history/subjective:  Currently proned with improved oxygenation. Decreased FIO2 to 80% and PEEP 14.  Objective   Blood pressure 106/60, pulse 100, temperature 98.4 F (36.9 C), temperature source Axillary, resp. rate (!) 21, height 5\' 9"  (1.753 m), weight 90.9 kg, SpO2 98 %. CVP:  [4 mmHg-9 mmHg] 9 mmHg  Vent Mode: PRVC FiO2 (%):  [90 %] 90 % Set Rate:  [24 bmp] 24 bmp Vt Set:  [560 mL] 560 mL PEEP:  [14 cmH20] 14 cmH20 Plateau Pressure:  [26 cmH20-30 cmH20] 26 cmH20   Intake/Output Summary (Last 24 hours) at 11/11/2020 0906 Last data filed at 11/11/2020 0600 Gross per 24 hour  Intake 4010.61 ml  Output 1925 ml  Net 2085.61 ml   Filed Weights   11/09/20 0500 11/10/20 0433 11/11/20 0500  Weight: 89.2 kg 89.2 kg 90.9 kg   Physical Exam: General: Chronically ill-appearing, sedated, proned HENT: [place], AT, ETT in place Eyes: EOMI, no  scleral icterus Respiratory: Coarse breath sound bilaterally. No wheezing noted Cardiovascular: Distant RRR, -M/R/G, no JVD Extremities:-Edema,-tenderness Neuro: Sedated GU: Foley in place   Resolved Hospital Problem list     Assessment & Plan:  Acute hypoxic/hypercapnic respiratory failure due to ARDS from COVID-19 pneumonia Complicated with S. aureus pneumonia -S/p remdesivir x 5d Plan Full vent mechanical ventilation Reviewed ABG. P:F ratio 171 while proned. Repeat ABG when supine Continue proning for P:F ratio <150 when supine Intermittent paralytic protocol PAD protocol for goal RASS -5 VAP Continue dexamethsone day 8/10 Unable to diurese due to soft pressures CXR tomorrow  Sepsis secondary to COVID and S. Aureus pneumonia -Resp Cx 12/8 +S.aureus -S/p 7 days Unasyn 12/6-12/12 Plan Start Vanc F/u susceptibilities  Acute metabolic encephalopathy in the setting of acute COVID viral infection and hypoxia Hx of severe schizoaffective disorder  Repeat head CT was negative Continue PAD protocol - dilaudid, propofol  Continue home medications include Xanax, Cogentin, Wellbutrin, Buspar, Celexa, and Risperadal EEG was negative for seizures  Bilateral DVTs Heparin gtt  Complete heart block S/P pacemaker Continuous telemetry  Supportive care   Acute Kidney Injury - Baseline creatinine 0.8.  -Worsening Cr however good UOP Plan Trend Bmet Avoid nephrotoxins Ensure adequate renal perfusion   Urinary retention with BPH  Foley in place  Best practice (evaluated daily)  Diet: NPO/tube feeding Pain/Anxiety/Delirium protocol (if indicated): dilaudid, propofol VAP protocol (if indicated): N/A DVT prophylaxis: Heparin gtt GI prophylaxis: PPI Glucose control: SSI Mobility: Bedrest  last date of multidisciplinary goals of care discussion: 12/6  Family and staff present: self and wife via phone  Summary of discussion: full code with aggressive support Follow up goals  of care discussion due:  12/11 Daily update on 11/11/20 with wife  Code Status: Full Disposition: ICU   Labs   CBC: Recent Labs  Lab 11/07/20 0330 11/07/20 0914 11/08/20 0321 11/08/20 1146 11/09/20 0611 11/09/20 0854 11/09/20 1143 11/10/20 0435 11/10/20 1526 11/11/20 0448 11/11/20 0831  WBC 10.9*  --  13.6*  --  17.7*  --   --  14.3*  --  18.9*  --   HGB 13.1   < > 12.4*   < > 12.4*   < > 12.6* 11.2* 11.9* 10.5* 9.9*  HCT 38.4*   < > 37.7*   < > 39.4   < > 37.0* 34.7* 35.0* 34.7* 29.0*  MCV 86.5  --  89.5  --  91.0  --   --  90.8  --  92.5  --   PLT 174  --  150  --  PLATELET CLUMPS NOTED ON SMEAR, UNABLE TO ESTIMATE  --   --  195  --  PLATELET CLUMPS NOTED ON SMEAR, UNABLE TO ESTIMATE  --    < > = values in this interval not displayed.    Basic Metabolic Panel: Recent Labs  Lab 11/06/20 1115 11/06/20 1640 11/07/20 0330 11/07/20 0914 11/07/20 1700 11/08/20 0321 11/08/20 1146 11/09/20 0611 11/09/20 0854 11/09/20 1143 11/10/20 1305 11/10/20 1526 11/11/20 0448 11/11/20 0831  NA  --   --  147*   < >  --  144   < > 145   < > 145 144 142 140 135  K  --   --  4.0   < >  --  4.5   < > 4.3   < > 3.8 4.1 4.0 4.4 4.5  CL  --   --  110  --   --  106  --  106  --   --  101  --  97*  --   CO2  --   --  26  --   --  31  --  28  --   --  31  --  31  --   GLUCOSE  --   --  111*  --   --  155*  --  116*  --   --  121*  --  142*  --   BUN  --   --  32*  --   --  31*  --  25*  --   --  28*  --  42*  --   CREATININE  --   --  1.07  --   --  0.99  --  0.79  --   --  1.03  --  1.64*  --   CALCIUM  --   --  8.0*  --   --  7.4*  --  7.6*  --   --  8.0*  --  7.6*  --   MG 2.5* 2.5* 2.1  --  2.0  --   --   --   --   --  1.6*  --   --   --   PHOS 2.3* 2.6 1.7*  --  4.3  --   --   --   --   --   --   --   --   --    < > = values in this interval not  displayed.   GFR: Estimated Creatinine Clearance: 50.7 mL/min (A) (by C-G formula based on SCr of 1.64 mg/dL (H)). Recent Labs  Lab  11/04/20 1513 11/05/20 0451 11/05/20 0451 11/06/20 0645 11/07/20 0330 11/08/20 0321 11/09/20 0611 11/10/20 0435 11/11/20 0448  PROCALCITON <0.10 0.66  --  0.70 0.47  --   --   --   --   WBC  --  16.6*   < > 12.1* 10.9* 13.6* 17.7* 14.3* 18.9*   < > = values in this interval not displayed.    Liver Function Tests: Recent Labs  Lab 11/05/20 0451 11/06/20 0645 11/07/20 0330 11/08/20 0321  AST 370* 215* 150* 111*  ALT 153* 116* 105* 124*  ALKPHOS 67 59 66 65  BILITOT 1.0 1.0 0.9 1.0  PROT 6.5 5.7* 5.3* 5.2*  ALBUMIN 3.3* 2.6* 2.4* 2.2*   No results for input(s): LIPASE, AMYLASE in the last 168 hours. No results for input(s): AMMONIA in the last 168 hours.  ABG    Component Value Date/Time   PHART 7.382 11/11/2020 0831   PCO2ART 57.5 (H) 11/11/2020 0831   PO2ART 154 (H) 11/11/2020 0831   HCO3 34.2 (H) 11/11/2020 0831   TCO2 36 (H) 11/11/2020 0831   ACIDBASEDEF 3.0 (H) 09/20/2018 0411   O2SAT 99.0 11/11/2020 0831     Coagulation Profile: No results for input(s): INR, PROTIME in the last 168 hours.  Cardiac Enzymes: No results for input(s): CKTOTAL, CKMB, CKMBINDEX, TROPONINI in the last 168 hours.  HbA1C: No results found for: HGBA1C  CBG: Recent Labs  Lab 11/10/20 1549 11/10/20 1954 11/10/20 2313 11/11/20 0346 11/11/20 0815  GLUCAP 120* 109* 108* 151* 138*   The patient is critically ill with multiple organ systems failure and requires high complexity decision making for assessment and support, frequent evaluation and titration of therapies, application of advanced monitoring technologies and extensive interpretation of multiple databases.  Independent Critical Care Time: 35 Minutes.   Mechele Collin, M.D. [facility] Pulmonary/Critical Care Medicine 11/11/2020 9:06 AM   Please see Amion for pager number to reach on-call Pulmonary and Critical Care Team.

## 2020-11-11 NOTE — Progress Notes (Signed)
ETT tube holder removed, Minor tear noted on the right cheek, Mepilex tape placed on the rt and left cheek. ETT tube secured with cloth tape to the right side. Pt proned without any complications with RN x 3 and RT x1. Small drop of blood came from the nose RN aware. Pt is tolerating well at this time. Pts head is turned to the right side.

## 2020-11-11 NOTE — Progress Notes (Signed)
eLink Physician-Brief Progress Note Patient Name: Nesbit Michon DOB: 1955-12-23 MRN: [id number]   Date of Service  11/11/2020  HPI/Events of Note  Multiple issues: 1. Hypoxia - d/t COVID pneumonia. P/F ratio = 124/1.0 =124. P/F ratio < 150 and 2. Hyperkalemia - K+ = 5.7.  eICU Interventions  Plan: 1. Prone patient tonight.  2. Lokelma 10 gm per tube now. 3. Repeat BMP at 5 AM.      Intervention Category Major Interventions: Hypoxemia - evaluation and management  Craigory Toste Eugene 11/11/2020, 10:30 PM

## 2020-11-11 NOTE — Progress Notes (Signed)
Turned pts head to the right side without any complications. ETT tube secured with cloth tape.

## 2020-11-11 NOTE — Progress Notes (Signed)
ANTICOAGULATION CONSULT NOTE - Initial Consult  Pharmacy Consult for heparin  Indication: DVT  Allergies  Allergen Reactions  . Duragesic-100 [Fentanyl] Other (See Comments)    Pt became violent    Patient Measurements: Height: 5\' 9"  (175.3 cm) Weight: 90.9 kg (200 lb 6.4 oz) IBW/kg (Calculated) : 70.7 Heparin Dosing Weight: 88 kg  Vital Signs: Temp: 99.2 F (37.3 C) (12/12 0346) Temp Source: Oral (12/12 0346) BP: 106/60 (12/12 0600) Pulse Rate: 100 (12/12 0600)  Labs: Recent Labs    11/09/20 0611 11/09/20 0854 11/10/20 0435 11/10/20 0436 11/10/20 1305 11/10/20 1418 11/10/20 1526 11/11/20 0448  HGB 12.4*   < > 11.2*  --   --   --  11.9* 10.5*  HCT 39.4   < > 34.7*  --   --   --  35.0* 34.7*  PLT PLATELET CLUMPS NOTED ON SMEAR, UNABLE TO ESTIMATE  --  195  --   --   --   --  PLATELET CLUMPS NOTED ON SMEAR, UNABLE TO ESTIMATE  HEPARINUNFRC 0.58  --   --  0.86* 0.68  --   --  0.62  CREATININE 0.79  --   --   --  1.03  --   --  1.64*  TROPONINIHS  --   --   --   --   --  36*  --   --    < > = values in this interval not displayed.    Estimated Creatinine Clearance: 50.7 mL/min (A) (by C-G formula based on SCr of 1.64 mg/dL (H)).  Assessment: 64 yo m presenting with COVID PNA - ddimer increased to > 20 this am  LE dopplers obtained showing BL DVTs  Am Heparin level draw came back at 0.62 which is therapeutic  Goal of Therapy:  Heparin level 0.3-0.7 units/ml Monitor platelets by anticoagulation protocol: Yes   Plan:  Continue Heparin gtt at 1250 units/hr Daily hep lvl cbc  77, PharmD, [facility] Clinical Pharmacist Please see AMION for all Pharmacists' Contact Phone Numbers 11/11/2020, 7:00 AM    Please check AMION for all [facility] Pharmacy numbers  11/11/2020 7:00 AM

## 2020-11-12 ENCOUNTER — Inpatient Hospital Stay (HOSPITAL_COMMUNITY): Payer: Medicaid Other

## 2020-11-12 DIAGNOSIS — J80 Acute respiratory distress syndrome: Secondary | ICD-10-CM

## 2020-11-12 LAB — POCT I-STAT 7, (LYTES, BLD GAS, ICA,H+H)
Acid-base deficit: 5 mmol/L — ABNORMAL HIGH (ref 0.0–2.0)
Bicarbonate: 25.2 mmol/L (ref 20.0–28.0)
Calcium, Ion: 0.83 mmol/L — CL (ref 1.15–1.40)
HCT: 27 % — ABNORMAL LOW (ref 39.0–52.0)
Hemoglobin: 9.2 g/dL — ABNORMAL LOW (ref 13.0–17.0)
O2 Saturation: 87 %
Potassium: 6.3 mmol/L (ref 3.5–5.1)
Sodium: 123 mmol/L — ABNORMAL LOW (ref 135–145)
TCO2: 28 mmol/L (ref 22–32)
pCO2 arterial: 77 mmHg (ref 32.0–48.0)
pH, Arterial: 7.123 — CL (ref 7.350–7.450)
pO2, Arterial: 72 mmHg — ABNORMAL LOW (ref 83.0–108.0)

## 2020-11-12 LAB — BLOOD GAS, ARTERIAL
Acid-Base Excess: 2.6 mmol/L — ABNORMAL HIGH (ref 0.0–2.0)
Bicarbonate: 30.8 mmol/L — ABNORMAL HIGH (ref 20.0–28.0)
Drawn by: 60057
FIO2: 100
O2 Saturation: 88.3 %
Patient temperature: 36.7
pCO2 arterial: 89.7 mmHg (ref 32.0–48.0)
pH, Arterial: 7.159 — CL (ref 7.350–7.450)
pO2, Arterial: 73.6 mmHg — ABNORMAL LOW (ref 83.0–108.0)

## 2020-11-12 LAB — GLUCOSE, CAPILLARY
Glucose-Capillary: 141 mg/dL — ABNORMAL HIGH (ref 70–99)
Glucose-Capillary: 133 mg/dL — ABNORMAL HIGH (ref 70–99)
Glucose-Capillary: 131 mg/dL — ABNORMAL HIGH (ref 70–99)
Glucose-Capillary: 201 mg/dL — ABNORMAL HIGH (ref 70–99)

## 2020-11-12 LAB — BASIC METABOLIC PANEL
Anion gap: 18 — ABNORMAL HIGH (ref 5–15)
Anion gap: 21 — ABNORMAL HIGH (ref 5–15)
BUN: 71 mg/dL — ABNORMAL HIGH (ref 8–23)
BUN: 93 mg/dL — ABNORMAL HIGH (ref 8–23)
CO2: 25 mmol/L (ref 22–32)
CO2: 21 mmol/L — ABNORMAL LOW (ref 22–32)
Calcium: 7 mg/dL — ABNORMAL LOW (ref 8.9–10.3)
Calcium: 6.4 mg/dL — CL (ref 8.9–10.3)
Chloride: 90 mmol/L — ABNORMAL LOW (ref 98–111)
Chloride: 85 mmol/L — ABNORMAL LOW (ref 98–111)
Creatinine, Ser: 3.43 mg/dL — ABNORMAL HIGH (ref 0.61–1.24)
Creatinine, Ser: 4.18 mg/dL — ABNORMAL HIGH (ref 0.61–1.24)
GFR, Estimated: 19 mL/min — ABNORMAL LOW (ref >60)
GFR, Estimated: 15 mL/min — ABNORMAL LOW (ref >60)
Glucose, Bld: 137 mg/dL — ABNORMAL HIGH (ref 70–99)
Glucose, Bld: 188 mg/dL — ABNORMAL HIGH (ref 70–99)
Potassium: 5.8 mmol/L — ABNORMAL HIGH (ref 3.5–5.1)
Potassium: 6.4 mmol/L (ref 3.5–5.1)
Sodium: 133 mmol/L — ABNORMAL LOW (ref 135–145)
Sodium: 127 mmol/L — ABNORMAL LOW (ref 135–145)

## 2020-11-12 LAB — CBC
HCT: 30.5 % — ABNORMAL LOW (ref 39.0–52.0)
Hemoglobin: 9.4 g/dL — ABNORMAL LOW (ref 13.0–17.0)
MCH: 28.1 pg (ref 26.0–34.0)
MCHC: 30.8 g/dL (ref 30.0–36.0)
MCV: 91.3 fL (ref 80.0–100.0)
Platelets: UNDETERMINED 10*3/uL (ref 150–400)
RBC: 3.34 MIL/uL — ABNORMAL LOW (ref 4.22–5.81)
RDW: 14.9 % (ref 11.5–15.5)
WBC: 25.7 10*3/uL — ABNORMAL HIGH (ref 4.0–10.5)
nRBC: 0 % (ref 0.0–0.2)

## 2020-11-12 LAB — HEMOGLOBIN AND HEMATOCRIT, BLOOD
HCT: 28 % — ABNORMAL LOW (ref 39.0–52.0)
Hemoglobin: 8.7 g/dL — ABNORMAL LOW (ref 13.0–17.0)

## 2020-11-12 LAB — HEPARIN LEVEL (UNFRACTIONATED)
Heparin Unfractionated: 0.72 IU/mL — ABNORMAL HIGH (ref 0.30–0.70)
Heparin Unfractionated: 0.63 IU/mL (ref 0.30–0.70)

## 2020-11-12 MED ORDER — PROSOURCE TF PO LIQD
45.0000 mL | Freq: Three times a day (TID) | ORAL | Status: DC
  Administered 2020-11-12 (×2): 45 mL
  Filled 2020-11-12 (×2): qty 45

## 2020-11-12 MED ORDER — ROCURONIUM BROMIDE 50 MG/5ML IV SOLN
100.0000 mg | Freq: Once | INTRAVENOUS | Status: DC
  Filled 2020-11-12: qty 10

## 2020-11-12 MED ORDER — BISACODYL 10 MG RE SUPP
10.0000 mg | Freq: Once | RECTAL | Status: AC
  Administered 2020-11-12: 15:00:00 10 mg via RECTAL
  Filled 2020-11-12: qty 1

## 2020-11-12 MED ORDER — ROCURONIUM BROMIDE 10 MG/ML (PF) SYRINGE
PREFILLED_SYRINGE | INTRAVENOUS | Status: AC
  Administered 2020-11-12: 21:00:00 10 mg
  Filled 2020-11-12: qty 10

## 2020-11-12 MED ORDER — DEXTROSE 5 % IV SOLN
2.0000 g | Freq: Two times a day (BID) | INTRAVENOUS | Status: DC
  Filled 2020-11-12: qty 20

## 2020-11-12 MED ORDER — SODIUM BICARBONATE 8.4 % IV SOLN
100.0000 meq | Freq: Once | INTRAVENOUS | Status: AC
  Administered 2020-11-12: 21:00:00 100 meq via INTRAVENOUS
  Filled 2020-11-12: qty 50

## 2020-11-12 MED ORDER — DEXTROSE 5 % IV SOLN
2.0000 g | Freq: Two times a day (BID) | INTRAVENOUS | Status: DC
  Filled 2020-11-12 (×4): qty 20

## 2020-11-12 MED ORDER — VECURONIUM BOLUS VIA INFUSION
0.0800 mg/kg | Freq: Once | INTRAVENOUS | Status: DC
  Filled 2020-11-12: qty 8

## 2020-11-12 MED ORDER — VANCOMYCIN VARIABLE DOSE PER UNSTABLE RENAL FUNCTION (PHARMACIST DOSING): Status: DC

## 2020-11-12 MED ORDER — VITAL 1.5 CAL PO LIQD
1000.0000 mL | ORAL | Status: DC
  Administered 2020-11-12: 15:00:00 1000 mL
  Filled 2020-11-12: qty 1000

## 2020-11-12 MED ORDER — MIDAZOLAM BOLUS VIA INFUSION
1.0000 mg | INTRAVENOUS | Status: DC | PRN
  Filled 2020-11-12: qty 2

## 2020-11-12 MED ORDER — VECURONIUM BROMIDE 10 MG IV SOLR
0.0000 ug/kg/min | Status: DC
  Filled 2020-11-12: qty 100

## 2020-11-12 MED ORDER — MIDAZOLAM HCL 2 MG/2ML IJ SOLN: 2.0000 mg | INTRAMUSCULAR | Status: DC | PRN

## 2020-11-12 MED ORDER — SODIUM ZIRCONIUM CYCLOSILICATE 10 G PO PACK
10.0000 g | PACK | Freq: Once | ORAL | Status: AC
  Administered 2020-11-12: 21:00:00 10 g
  Filled 2020-11-12: qty 1

## 2020-11-12 MED ORDER — SODIUM BICARBONATE 8.4 % IV SOLN
INTRAVENOUS | Status: AC
  Administered 2020-11-12: 21:00:00 100 meq via INTRAVENOUS
  Filled 2020-11-12: qty 50

## 2020-11-12 MED ORDER — VASOPRESSIN 20 UNITS/100 ML INFUSION FOR SHOCK
0.0000 [IU]/min | INTRAVENOUS | Status: DC
  Administered 2020-11-12: 18:00:00 0.03 [IU]/min via INTRAVENOUS
  Filled 2020-11-12: qty 100

## 2020-11-12 MED ORDER — STERILE WATER FOR INJECTION IV SOLN
INTRAVENOUS | Status: DC
  Filled 2020-11-12: qty 850

## 2020-11-12 MED ORDER — POLYETHYLENE GLYCOL 3350 17 G PO PACK
17.0000 g | PACK | Freq: Every day | ORAL | Status: DC
  Administered 2020-11-12: 15:00:00 17 g
  Filled 2020-11-12: qty 1

## 2020-11-12 MED ORDER — CEFAZOLIN SODIUM-DEXTROSE 2-4 GM/100ML-% IV SOLN: 2.0000 g | Freq: Two times a day (BID) | INTRAVENOUS | Status: DC

## 2020-11-12 MED ORDER — INSULIN ASPART 100 UNIT/ML IV SOLN
10.0000 [IU] | Freq: Once | INTRAVENOUS | Status: AC
  Administered 2020-11-12: 21:00:00 10 [IU] via INTRAVENOUS

## 2020-11-12 MED ORDER — CALCIUM GLUCONATE-NACL 1-0.675 GM/50ML-% IV SOLN
1.0000 g | Freq: Once | INTRAVENOUS | Status: AC
  Administered 2020-11-12: 21:00:00 1000 mg via INTRAVENOUS
  Filled 2020-11-12: qty 50

## 2020-11-12 MED ORDER — MIDAZOLAM 50MG/50ML (1MG/ML) PREMIX INFUSION
2.0000 mg/h | INTRAVENOUS | Status: DC
  Administered 2020-11-12: 22:00:00 2 mg/h via INTRAVENOUS
  Filled 2020-11-12: qty 50

## 2020-11-12 MED ORDER — ARTIFICIAL TEARS OPHTHALMIC OINT
1.0000 "application " | TOPICAL_OINTMENT | Freq: Three times a day (TID) | OPHTHALMIC | Status: DC
  Administered 2020-11-12: 1 via OPHTHALMIC

## 2020-11-12 MED ORDER — DEXTROSE 50 % IV SOLN
1.0000 | Freq: Once | INTRAVENOUS | Status: AC
  Administered 2020-11-12: 21:00:00 50 mL via INTRAVENOUS
  Filled 2020-11-12: qty 50

## 2020-11-13 NOTE — Progress Notes (Signed)
Referral number:  [id number]

## 2020-11-14 MED FILL — Medication: Qty: 1 | Status: AC

## 2020-12-01 NOTE — Progress Notes (Signed)
Pt went asystole at 2228pm CPR initiated 3 rounds of Epi given 2 amps of bicarb given physician at bedside Dr. Lynnell Catalan  MD pronounced time of death at 2244 30 ml of dilaudid wasted with CN Dustin Flock RN 76ml of versed wasted with CN Dustin Flock RN. Family contacted, [facility] contacted,  [facility] [place] preferred per wife

## 2020-12-01 NOTE — Progress Notes (Signed)
ANTICOAGULATION CONSULT NOTE  Pharmacy Consult for heparin  Indication: DVT  Allergies  Allergen Reactions  . Duragesic-100 [Fentanyl] Other (See Comments)    Pt became violent    Patient Measurements: Height: 5\' 9"  (175.3 cm) Weight: 90.9 kg (200 lb 6.4 oz) IBW/kg (Calculated) : 70.7 Heparin Dosing Weight: 88 kg  Vital Signs: BP: 127/50 (12/13 1131) Pulse Rate: 93 (12/13 1131)  Labs: Recent Labs    11/10/20 0435 11/10/20 0436 11/10/20 1305 11/10/20 1418 11/10/20 1526 11/11/20 0448 11/11/20 0831 11/11/20 1920 11/15/2020 0445 11/03/2020 1323  HGB 11.2*  --   --   --    < > 10.5* 9.9* 10.5* 9.4*  --   HCT 34.7*  --   --   --    < > 34.7* 29.0* 31.0* 30.5*  --   PLT 195  --   --   --   --  PLATELET CLUMPS NOTED ON SMEAR, UNABLE TO ESTIMATE  --   --  PLATELET CLUMPS NOTED ON SMEAR, UNABLE TO ESTIMATE  --   HEPARINUNFRC  --    < > 0.68  --   --  0.62  --   --  0.72* 0.63  CREATININE  --   --  1.03  --   --  1.64*  --   --  3.43*  --   TROPONINIHS  --   --   --  36*  --   --   --   --   --   --    < > = values in this interval not displayed.    Estimated Creatinine Clearance: 24.3 mL/min (A) (by C-G formula based on SCr of 3.43 mg/dL (H)).  Assessment: 65 yo m presenting with COVID PNA - ddimer increased to > 20 this am  LE dopplers obtained showing BL DVTs  Heparin level therapeutic at 0.63 after reduction to 1150 units/hr.  RN to call if unable to stop or if it worsens. Hgb 9.4- stable.  Goal of Therapy:  Heparin level 0.3-0.7 units/ml Monitor platelets by anticoagulation protocol: Yes   Plan:  Decrease Heparin gtt to 1050 units/hr to prevent accumulation.  Daily Heparin level and CBC while on therapy F/u nosebleed  77, PharmD, BCPS, BCCCP Clinical Pharmacist Please refer to [facility] for [facility] Pharmacy numbers 11/17/2020, 1:57 PM

## 2020-12-01 NOTE — Progress Notes (Addendum)
eLink Physician-Brief Progress Note Patient Name: Billy Simon DOB: Aug 23, 1956 MRN: [id number]   Date of Service  11/20/2020  HPI/Events of Note  Multiple issues: 1. ABG on 100%/PRVC 30/TV 560/P 14 = 7.123/77.0/72.0, 2. K+ = 6.3. EKG paced - wide complex, 3. Heparin held d/t nose bleed. Restart Harparin IV infusion? And 4. Pronation - Patient is too unstable to prone tonight.   eICU Interventions  Plan: 1. Increase PC rate to 35. 2. NaHCO3 100 meq IV now.  3. NaHCO3 IV infusion to run IV at 100 meq/hour. 4. Calcium gluconate 1 gm IV now. 5. D50 1 amp IV now. 6. Novolog insulin 10 units IV now.  7. Repeat ABG at 10:30 PM. 8. Repeat BMP at 12:30 AM. 9.Portable CXR STAT.  10. Hold IV heparin X 4 hours, then reassess nose bleeding prior to deciding on further Herparin IV infusion therapy.      Intervention Category Major Interventions: Electrolyte abnormality - evaluation and management;Acid-Base disturbance - evaluation and management;Respiratory failure - evaluation and management;Hypoxemia - evaluation and management  Lenell Antu 11/23/2020, 8:36 PM

## 2020-12-01 NOTE — Progress Notes (Addendum)
Noted to have bleeding from nose and mouth.  Worsening respiratory acidosis.  More hypotensive.  Will increase RR to 30 and repeat ABG in 2 hours.  Hold heparin infusion and aspirin, and f/u Hemoglobin.  Add vasopressin.  Coralyn Helling, MD [facility] Pulmonary/Critical Care Pager - [phone] 12/07/20, 6:08 PM

## 2020-12-01 NOTE — Progress Notes (Addendum)
PCCM Progress note  Notified by [facility] provider that patient was deteriorating, on arrival several medication changes and interventions were underway. Bedside point of care ultrasound was preformed by Dr. Alena Bills with normal RV function seen. Given continued worsening hypoxic and hypercapnic respiratory failure decision was made to paralysis patient. Will also attempt to place patient on in side lying position and prone when/if becomes more stable. Will plan to repeat ABG and Bmet later tonight.   Delfin Gant, NP-C [facility] & Critical [facility] Contact / Pager information can be found on Amion  November 19, 2020, 9:24 PM  Critical care attending attestation note:  Patient seen and examined and relevant ancillary tests reviewed.  I agree with the assessment and plan of care as outlined by Raymon Mutton, NP. This patient was not seen as a shared visit. The following reflects my independent critical care time.   Synopsis of assessment and plan:  65 year old man with COVID-19 pneumonia and ARDS.  Critically ill due to acute hypoxic hypercarbic respiratory failure require mechanical ventilation.  Called to bedside for worsening hypotension and respiratory acidosis.  Started on vasopressin by E. Link.  Oxygenation is adequate and I have decreased the FiO2 to 0.8.  To improve acidosis we will start him on a bicarb infusion as well as give him 2 ampules of sodium bicarbonate.  I have placed him in partial right lateral decubitus as is right lung is less affected this will likely improve VQ matching.  Initiated neuromuscular blockade.  We will switch propofol to midazolam for improved hemodynamics.  Repeat ABG at midnight.   CRITICAL CARE Performed by: Lynnell Catalan   Total critical care time: 30 minutes of additional time.  Critical care time was exclusive of separately billable procedures and treating other patients.  Critical care was necessary to treat or prevent imminent or life-threatening  deterioration.  Critical care was time spent personally by me on the following activities: development of treatment plan with patient and/or surrogate as well as nursing, discussions with consultants, evaluation of patient's response to treatment, examination of patient, obtaining history from patient or surrogate, ordering and performing treatments and interventions, ordering and review of laboratory studies, ordering and review of radiographic studies, pulse oximetry, re-evaluation of patient's condition and participation in multidisciplinary rounds.  Lynnell Catalan, MD [facility] ICU Physician [facility] [place] Critical Care  Pager: [phone] Mobile: [phone] After hours: [phone].  19-Nov-2020, 9:33 PM

## 2020-12-01 NOTE — Death Summary Note (Signed)
Billy Simon was a 65 y.o. male admitted on 11-05-20 with altered mental status from septis.  He was diagnosed with COVID 19 pneumonia.  He received bamlanivimab/etesevimab on 12/03.  Also treated with remdesivir and steroids.  He required intubation on 12/06 and started on ARDS protocol and prone positioning.  Developed septic shock and started on pressors.  Found to have MSSA HCAP and started on antibiotics.  Found to have b/l lower leg DVT and started on heparin gtt.  He developed epistaxis and heparin gtt held.  He developed worsening acidosis and then developed cardiac arrest with asystole.  Resuscitative measures attempted but were unsuccessful.  He expired on 11/14/2020 at 22:44.  Final diagnoses: ARDS from COVID 19 pneumonia MSSA HCAP Septic shock Acute hypoxic/hypercapnic respiratory failure Acute metabolic encephalopathy from sepsis, hypoxia History of schizoaffective disorder History of complete heart block s/p pacemaker Bilateral lower leg acute popliteal vein DVTs Epistaxis AKI from ATN 2nd to hypoxia, sepsis Hyperkalemia  Hyponatremia Hypocalcemia Steroid induced hyperglycemia Acute urine retention with hx of BPH Anemia of critical illness and acute blood loss from epistaxis  Billy Helling, MD [facility] Pulmonary/Critical Care 11/19/2020, 10:10 AM

## 2020-12-01 NOTE — Progress Notes (Signed)
Nutrition Follow-up  DOCUMENTATION CODES:   Obesity unspecified  INTERVENTION:   Tube Feeding via OG: Vital 1.5 at 60 ml/hr Pro-Source 45 mL TID Provides 2280 kcals, 130 g of protein and 1094 mL of free water Meets 100% estimated calorie and protein needs  Recommend increasing bowel regimen as pt without BM x 10 days   NUTRITION DIAGNOSIS:   Inadequate oral intake related to acute illness as evidenced by NPO status.  Being addressed via TF  GOAL:   Patient will meet greater than or equal to 90% of their needs  Being via TF  MONITOR:   Vent status,TF tolerance,Labs,Weight trends  REASON FOR ASSESSMENT:   Consult,Ventilator Enteral/tube feeding initiation and management  ASSESSMENT:   65 yo male admitted with acute respiratory failure secondary to COVID-19 pneumonia with aspiration requiring intubation. PMH includes severe schizoaffective disorder, anxiety/depression, complete heart block s/p pacemaker   12/03 Admitted 12/06 Intubated  Pt remains on vent support, currently in prone position, remains on pressors  Vital High Protein at 65 ml/hr, Pro-source 45 mL daily, free water flush of 200 mL q 4 hours via   Weight stable since 12/08  No BM x 10 days; dulcolax prn, colace BID  Labs: sodium 133 (L), potassium 5.8 (H) Meds: reviewed   Diet Order:   Diet Order            Diet NPO time specified  Diet effective now                 EDUCATION NEEDS:   Not appropriate for education at this time  Skin:  Skin Assessment: Skin Integrity Issues: Skin Integrity Issues:: Stage I Stage I: ear  Last BM:  no documented BM since admission (x 10 days)  Height:   Ht Readings from Last 1 Encounters:  11/21/2020 5\' 9"  (1.753 m)    Weight:   Wt Readings from Last 1 Encounters:  11/11/20 90.9 kg    BMI:  Body mass index is 29.59 kg/m.  Estimated Nutritional Needs:   Kcal:  14/12/21 kcals  Protein:  125-155 g  Fluid:  >/= 2 L   [id number]  MS, RDN, LDN, CNSC Registered Dietitian III Clinical Nutrition RD Pager and On-Call Pager Number Located in [place]

## 2020-12-01 NOTE — Progress Notes (Signed)
ANTICOAGULATION CONSULT NOTE  Pharmacy Consult for heparin  Indication: DVT  Allergies  Allergen Reactions  . Duragesic-100 [Fentanyl] Other (See Comments)    Pt became violent    Patient Measurements: Height: 5\' 9"  (175.3 cm) Weight: 90.9 kg (200 lb 6.4 oz) IBW/kg (Calculated) : 70.7 Heparin Dosing Weight: 88 kg  Vital Signs: Temp: 98.2 F (36.8 C) (12/12 2321) Temp Source: Oral (12/12 2321) BP: 121/55 (12/13 0426) Pulse Rate: 93 (12/13 0426)  Labs: Recent Labs    11/09/20 0611 11/09/20 0854 11/10/20 0435 11/10/20 0436 11/10/20 1305 11/10/20 1418 11/10/20 1526 11/11/20 0448 11/11/20 0831 11/11/20 1920 11/21/2020 0445  HGB 12.4*   < > 11.2*  --   --   --    < > 10.5* 9.9* 10.5*  --   HCT 39.4   < > 34.7*  --   --   --    < > 34.7* 29.0* 31.0*  --   PLT PLATELET CLUMPS NOTED ON SMEAR, UNABLE TO ESTIMATE  --  195  --   --   --   --  PLATELET CLUMPS NOTED ON SMEAR, UNABLE TO ESTIMATE  --   --   --   HEPARINUNFRC 0.58  --   --    < > 0.68  --   --  0.62  --   --  0.72*  CREATININE 0.79  --   --   --  1.03  --   --  1.64*  --   --   --   TROPONINIHS  --   --   --   --   --  36*  --   --   --   --   --    < > = values in this interval not displayed.    Estimated Creatinine Clearance: 50.7 mL/min (A) (by C-G formula based on SCr of 1.64 mg/dL (H)).  Assessment: 65 yo m presenting with COVID PNA - ddimer increased to > 20 this am  LE dopplers obtained showing BL DVTs  Heparin level slightly supratherapeutic (0.72) on gtt at 1250 units/hr. Pt is having a nosebleed. RN to call if unable to stop or if it worsens. Hgb 9.4.  Goal of Therapy:  Heparin level 0.3-0.7 units/ml Monitor platelets by anticoagulation protocol: Yes   Plan:  Decrease Heparin gtt to 1150 units/hr F/u 6 hr heparin level F/u nosebleed  77, PharmD, BCPS Please see amion for complete clinical pharmacist phone list 11/28/2020, 5:23 AM

## 2020-12-01 NOTE — Progress Notes (Signed)
Pt supined at this time with no complications. Pt having significant bleeding from the nose. Gauze was placed between nose and commercial tube holder to help with bleeding. RN aware. SpO2 dropped slightly, fio2 increased from 70 to 80%.

## 2020-12-01 NOTE — Progress Notes (Signed)
Pharmacy Antibiotic Note  Billy Simon is a 65 y.o. male admitted on 2020-11-29 with MSSA pneumonia.  Pharmacy has been consulted for Cefazolin dosing. Recent vancomycin dose at 2250 PM on 12/12. Culture sensitive. SCr jumped from 1.64 >> 3.43. WBC up 25.7. Afebrile. Currently requiring Levophed at 10 mcg/min.   Plan: Cefazolin 2 g IV every 12 hours.  Monitor renal function and clinical status  Height: 5\' 9"  (175.3 cm) Weight: 90.9 kg (200 lb 6.4 oz) IBW/kg (Calculated) : 70.7  Temp (24hrs), Avg:98.2 F (36.8 C), Min:98.1 F (36.7 C), Max:98.7 F (37.1 C)  Recent Labs  Lab 11/08/20 0321 11/09/20 0611 11/10/20 0435 11/10/20 1305 11/11/20 0448 11/24/2020 0445  WBC 13.6* 17.7* 14.3*  --  18.9* 25.7*  CREATININE 0.99 0.79  --  1.03 1.64* 3.43*    Estimated Creatinine Clearance: 24.3 mL/min (A) (by C-G formula based on SCr of 3.43 mg/dL (H)).    Allergies  Allergen Reactions   Duragesic-100 [Fentanyl] Other (See Comments)    Pt became violent    Antimicrobials this admission: Unasyn 12/6>>12/13  Vanc 12/12 >> 12/13 Cefazolin 12/13 >> Bam/etes x 1 12/3  Remdesivir 12/5 >> (12/9)  Dexa 6 IV 12/4 >>  Dose adjustments this admission:  Microbiology results: 12/3 COVID: positive 12/3 flu: negative 12/8Sputum: MSSA  Thank you for allowing pharmacy to be a part of this patients care.  14/3, PharmD, BCPS, BCCCP Clinical Pharmacist Please refer to [facility] for [facility] Pharmacy numbers 10/31/2020 8:43 AM

## 2020-12-01 NOTE — Progress Notes (Signed)
NAME:  Billy Simon, MRN:  [id number], DOB:  1956/02/07, LOS: 10 ADMISSION DATE:  11/09/2020, CONSULTATION DATE:  11/05/2020 REFERRING MD:  Dr Thedore Mins, CHIEF COMPLAINT:  AMS    Brief History   65yo male presented with known diagnosis of COVID now with worsening confusion. PCCM consulted 12/6 for worsening confusion with concern for developing sepsis due to aspiration pneumonia requiring intubation and mechanical ventilation  Past Medical History  Severe schizoaffective disorder, complete heart block S/P pacemaker, anxiety, depression, and syncope  Significant Hospital Events   12/3 Admitted to [facility] 12/6 PCCM consulted for AMS. Required intubation 12/8 start prone positioning  Consults:    Procedures:  12/6 ETT >>  12/6 Rt IJ CVL >>   Significant Diagnostic Tests:   Head CT 12/3 > Negative   Doppler legs b/l 12/9 > acute DVT Rt and Lt popliteal vein  Micro Data:  COVID 12/3 > positive  Blood culture 12/6 > negative Trach culture 12/8 > MSSA  Antimicrobials:  Remdesivir 12/4 > 12/9 Unasyn 12/6 > 12/12 Vancomycin 12/12 Ancef 12/13 >   Interim history/subjective:  Prone positioning. Remains on pressors.  Objective   Blood pressure (!) 113/46, pulse 89, temperature 98.2 F (36.8 C), temperature source Oral, resp. rate (!) 25, height 5\' 9"  (1.753 m), weight 90.9 kg, SpO2 98 %.    Vent Mode: PRVC FiO2 (%):  [70 %-80 %] 70 % Set Rate:  [24 bmp] 24 bmp Vt Set:  [560 mL] 560 mL PEEP:  [14 cmH20-24 cmH20] 14 cmH20 Plateau Pressure:  [20 cmH20-33 cmH20] 27 cmH20   Intake/Output Summary (Last 24 hours) at 11/15/2020 0855 Last data filed at 11/07/2020 0500 Gross per 24 hour  Intake 3225.01 ml  Output 610 ml  Net 2615.01 ml   Filed Weights   11/09/20 0500 11/10/20 0433 11/11/20 0500  Weight: 89.2 kg 89.2 kg 90.9 kg   Physical Exam:  General - sedated, prone positioning Eyes - periorbital edema ENT - ETT in place Cardiac - regular rate/rhythm, no murmur Chest  - b/l rhonchi, decreased BS b/s Abdomen - soft, decreased bowel sounds Extremities - 1+ edema Skin - no rashes Neuro - RASS -3  Resolved Hospital Problem list     Assessment & Plan:   Acute hypoxic/hypercapnic respiratory failure from COVID 19 pneumonia with ARDS. - received bamlanivimab/etesevimab on 12/03 - completed remdesivir - completed 10 days of decadron >> d/c on 12/13 - goal SpO2 > 88% - f/u CXR, ABG - goal P plat < 30, driving force < 15 - prone positioning for PaO2:FiO2 < 150  Septic shock from COVID pneumonia and MSSA HCAP. - wean pressors to keep MAP > 65 - day 8/10 of Abx, change to ancef on 12/13  Acute metabolic encephalopathy 2nd to sepsis, hypoxia. Hx of severe schizoaffective disorder. - RASS goal -2 to -3 - continue buspar, risperdal, cogentin - prn xanax  Bilateral lower leg DVTs in setting of COVID infection. - continue heparin gtt - consider changing to enteral agent on 12/14 if no further episodes of epistaxis  Epistaxis. - noted 12/12 - improved 12/13 - monitor  AKI from ATN 2nd to hypoxia, sepsis, Hyperkalemia. Urine retention with hx of BPH. - baseline creatinine 1.31 from 11/29/2020 - still making urine - f/u BMET, ABG, urine outpt - if this gets worse, then might need nephrology assessment  Hx of complete heart block S/P pacemaker. Hx of HLD. - monitor on telemetry - continue lipitor, ASA - not clear why he is  on plavix >> will discontinue this  Anemia of critical illness. - f/u CBC - transfuse for Hb < 7 or significant bleeding  Best practice (evaluated daily)  Diet: tube feeds DVT prophylaxis: Heparin gtt GI prophylaxis: protonix Mobility: Bedrest  last date of multidisciplinary goals of care discussion: 12/6 Family and staff present: self and wife via phone  Summary of discussion: full code with aggressive support Follow up goals of care discussion due:   Code Status: Full Disposition: ICU   Labs    CMP Latest Ref  Rng & Units 2020-11-28 11/11/2020 11/11/2020  Glucose 70 - 99 mg/dL 010(X) - -  BUN 8 - 23 mg/dL 32(T) - -  Creatinine 5.57 - 1.24 mg/dL 3.22(G) - -  Sodium 254 - 145 mmol/L 133(L) 131(L) 135  Potassium 3.5 - 5.1 mmol/L 5.8(H) 5.7(H) 4.5  Chloride 98 - 111 mmol/L 90(L) - -  CO2 22 - 32 mmol/L 25 - -  Calcium 8.9 - 10.3 mg/dL 7.0(L) - -  Total Protein 6.5 - 8.1 g/dL - - -  Total Bilirubin 0.3 - 1.2 mg/dL - - -  Alkaline Phos 38 - 126 U/L - - -  AST 15 - 41 U/L - - -  ALT 0 - 44 U/L - - -    CBC Latest Ref Rng & Units 2020/11/28 11/11/2020 11/11/2020  WBC 4.0 - 10.5 K/uL 25.7(H) - -  Hemoglobin 13.0 - 17.0 g/dL 2.7(C) 10.5(L) 9.9(L)  Hematocrit 39.0 - 52.0 % 30.5(L) 31.0(L) 29.0(L)  Platelets 150 - 400 K/uL PLATELET CLUMPS NOTED ON SMEAR, UNABLE TO ESTIMATE - -    ABG    Component Value Date/Time   PHART 7.274 (L) 11/11/2020 1920   PCO2ART 65.5 (HH) 11/11/2020 1920   PO2ART 124 (H) 11/11/2020 1920   HCO3 30.3 (H) 11/11/2020 1920   TCO2 32 11/11/2020 1920   ACIDBASEDEF 3.0 (H) 09/20/2018 0411   O2SAT 98.0 11/11/2020 1920    CBG (last 3)  Recent Labs    11/11/20 1957 11/11/20 2319 November 28, 2020 0342  GLUCAP 193* 169* 141*    Critical care time: 39 minutes  Coralyn Helling, MD [place] [facility] Pager - [phone] 28-Nov-2020, 9:14 AM

## 2020-12-01 NOTE — Code Documentation (Signed)
PCCM Cardiac arrest note   Arrived at bedside with CPR in progress and relieved E-Link MD.  Patient asystolic with no response to of good quality CPR, administration of NaHCO3 and epinephrine, and bilateral needle chest decompressions. Copious blood noted in ETT on my arrival and prior to needle decompressions.   Patient's family was notified and the family will come by and see him prior to transfer to morgue.   Lynnell Catalan, MD [facility] ICU Physician [facility] [place] [facility]  Pager: [phone] Or Epic Secure Chat After hours: [phone].  11/03/2020, 10:53 PM

## 2020-12-01 DEATH — deceased

## 2021-01-21 IMAGING — DX DG CHEST 1V PORT
1 series · 1 of 1 positions shown · non-contrast
Comparison: 11/02/2020

CLINICAL DATA: Shortness of breath, [id number] positivity

EXAM:
PORTABLE CHEST 1 VIEW

[chest]
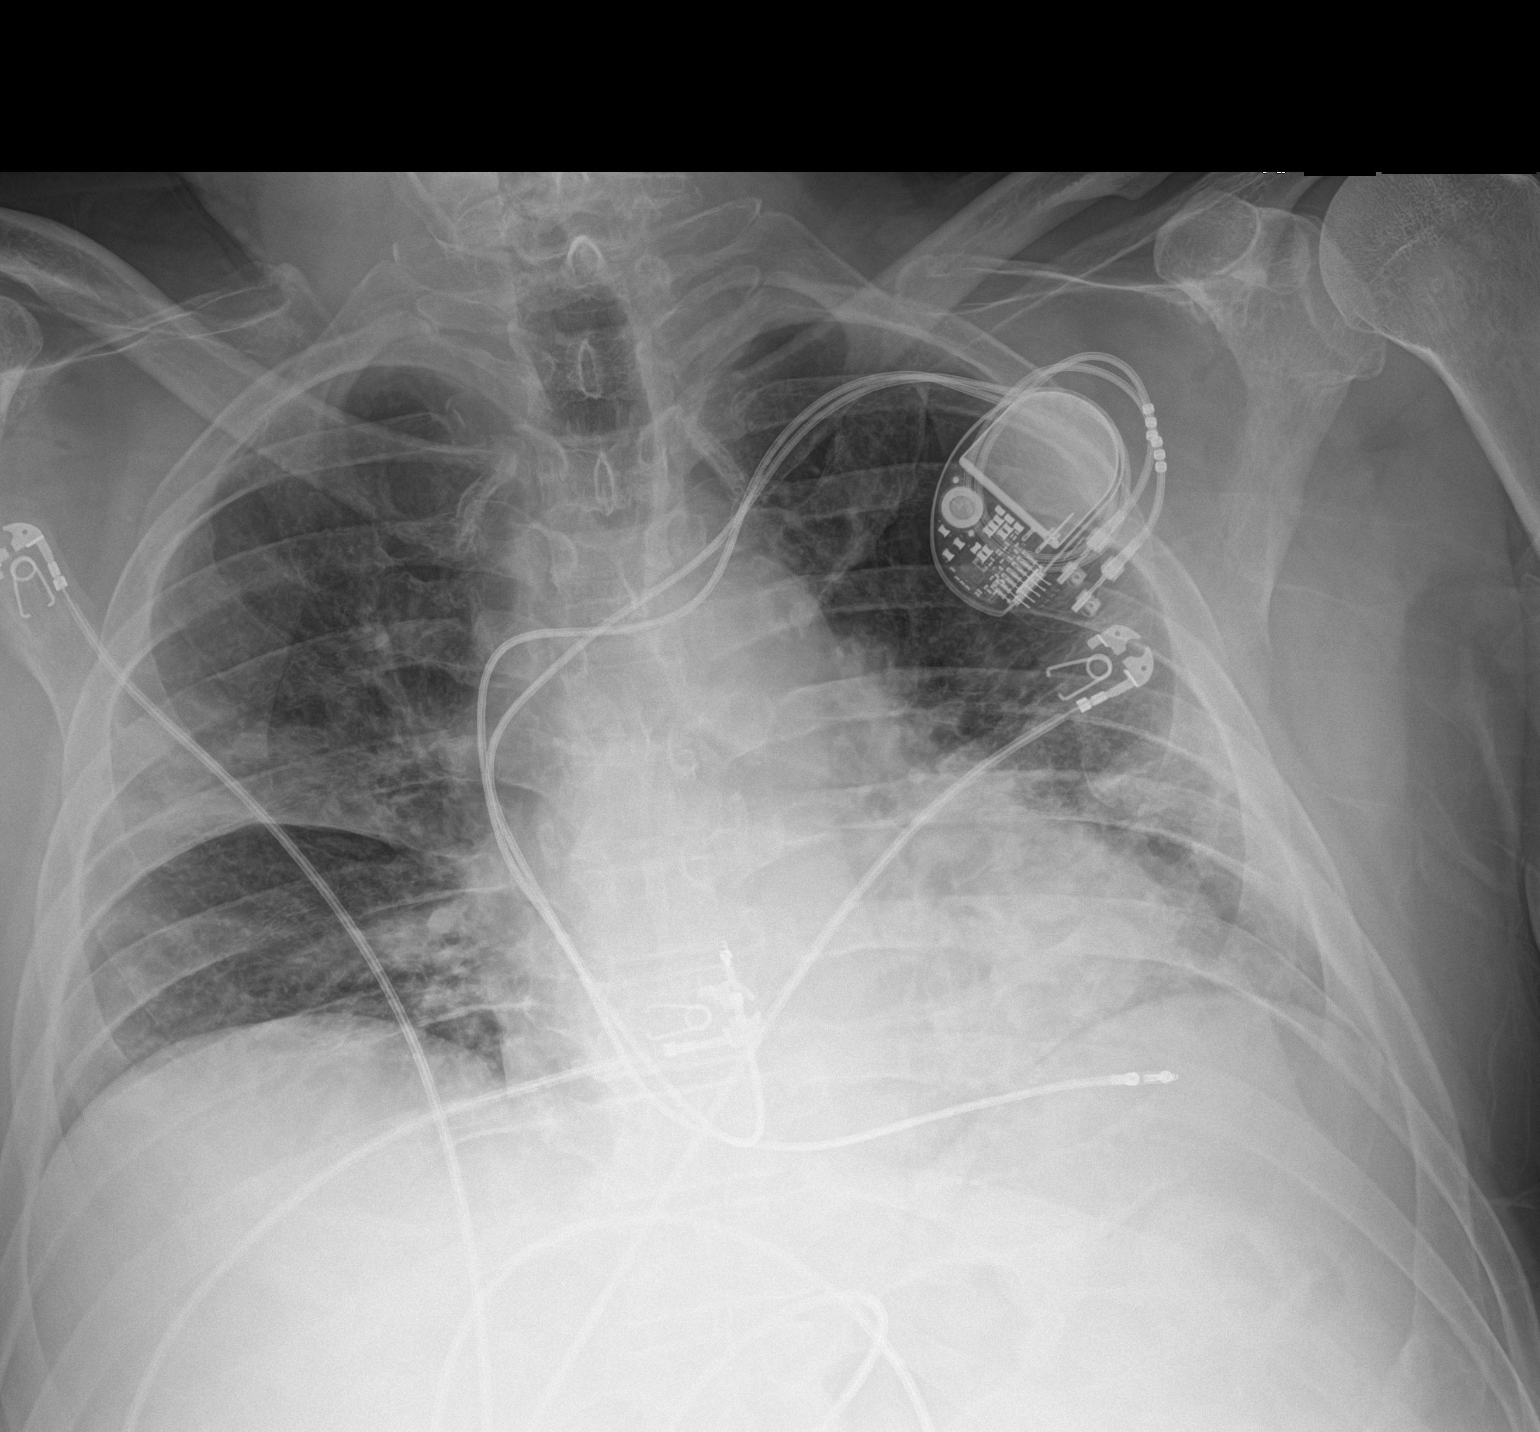

[1 of 1 positions shown; findings below may reference images not displayed]

FINDINGS: Cardiac shadow is enlarged. Pacing device is again seen. Patchy
right upper lobe infiltrate is noted along the minor fissure.
Basilar opacity is noted on the left as well. This has increased
somewhat in the interval from the prior exam. No sizable effusion is
seen. No bony abnormality is noted.
IMPRESSION: Increase in patchy airspace opacities bilaterally consistent with
the given clinical history.

## 2021-01-23 IMAGING — DX DG CHEST 1V PORT
1 series · 1 of 1 positions shown · non-contrast
Comparison: 11/03/2020 chest radiograph and prior.

CLINICAL DATA: COVID patient with fever and confusion.

EXAM:
PORTABLE CHEST 1 VIEW

[chest ap]
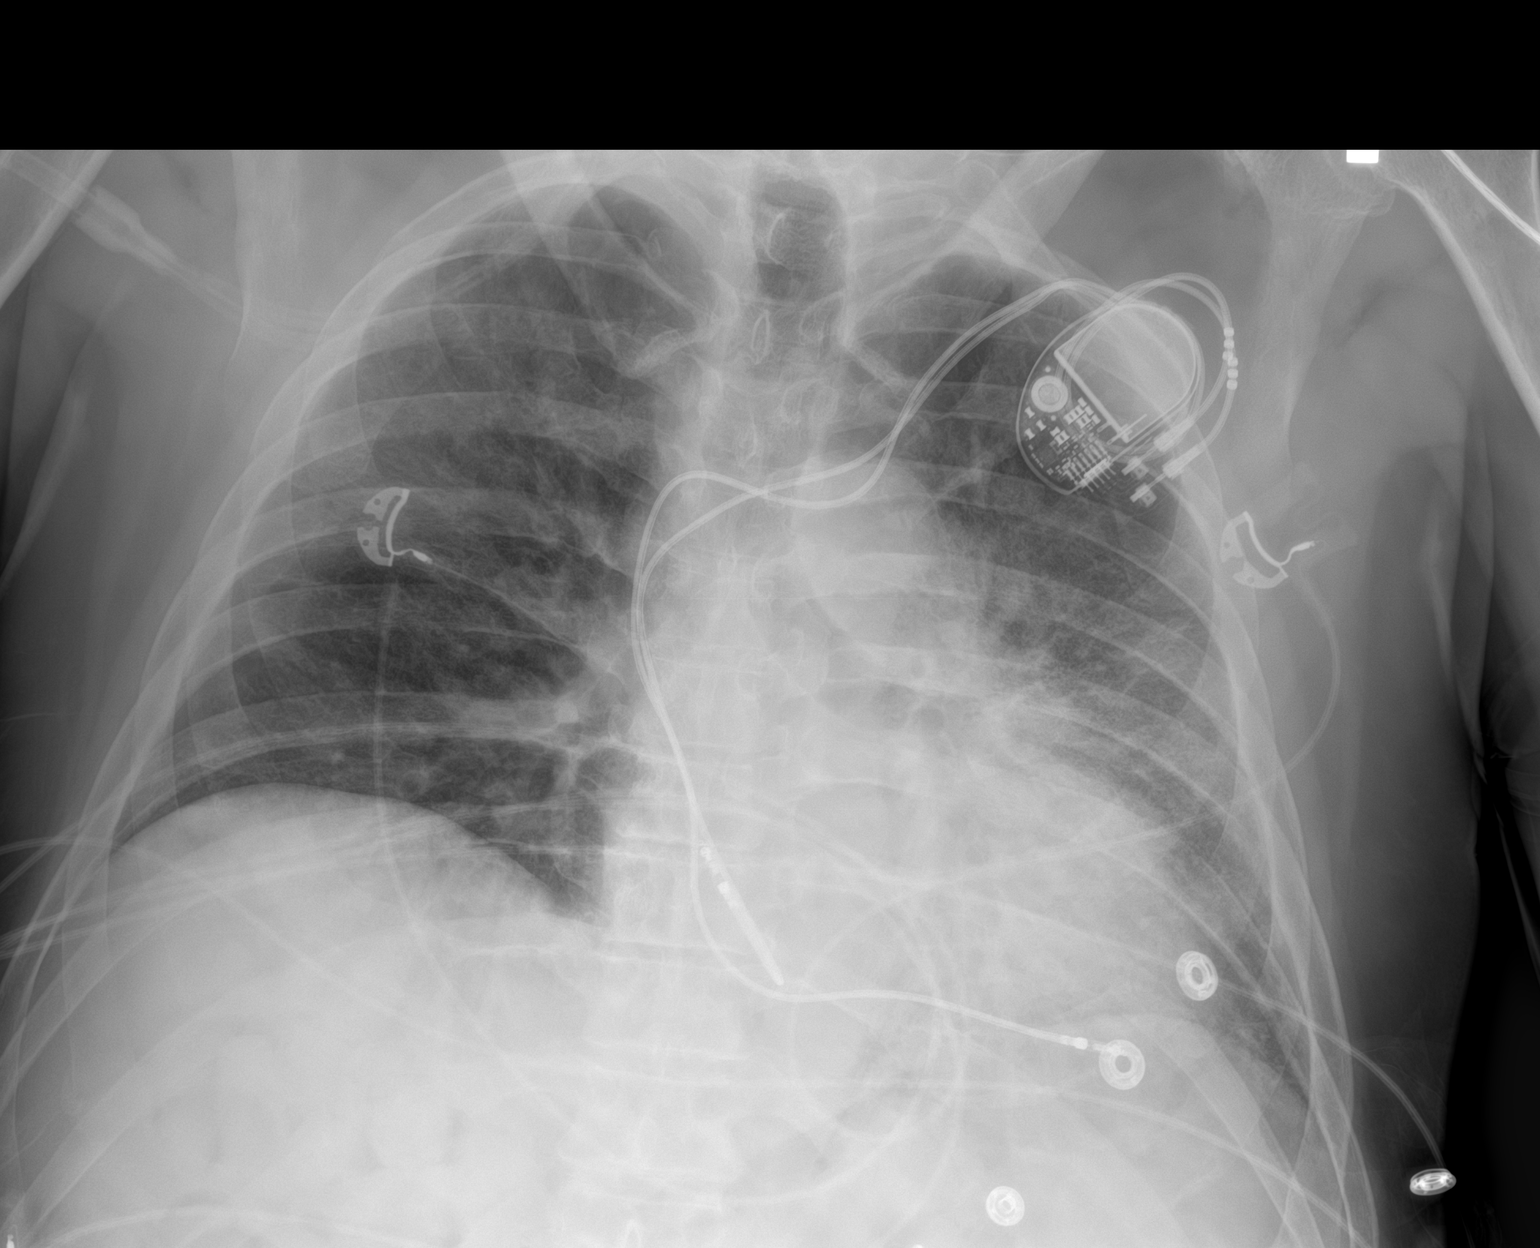

[1 of 1 positions shown; findings below may reference images not displayed]

FINDINGS: Left predominant patchy perihilar/basilar opacities. Improved right
lung aeration. No pneumothorax or pleural effusion. Partially
obscured cardiomediastinal silhouette. Left chest pacing device.
IMPRESSION: Bilateral pulmonary opacities with improved right lung aeration.

## 2021-01-23 IMAGING — DX DG CHEST 1V PORT
1 series · 1 of 1 positions shown · non-contrast
Comparison: 11/05/2020 at 4942 hours

CLINICAL DATA: Endotracheal tube placement.

EXAM:
PORTABLE CHEST 1 VIEW

[chest ap]
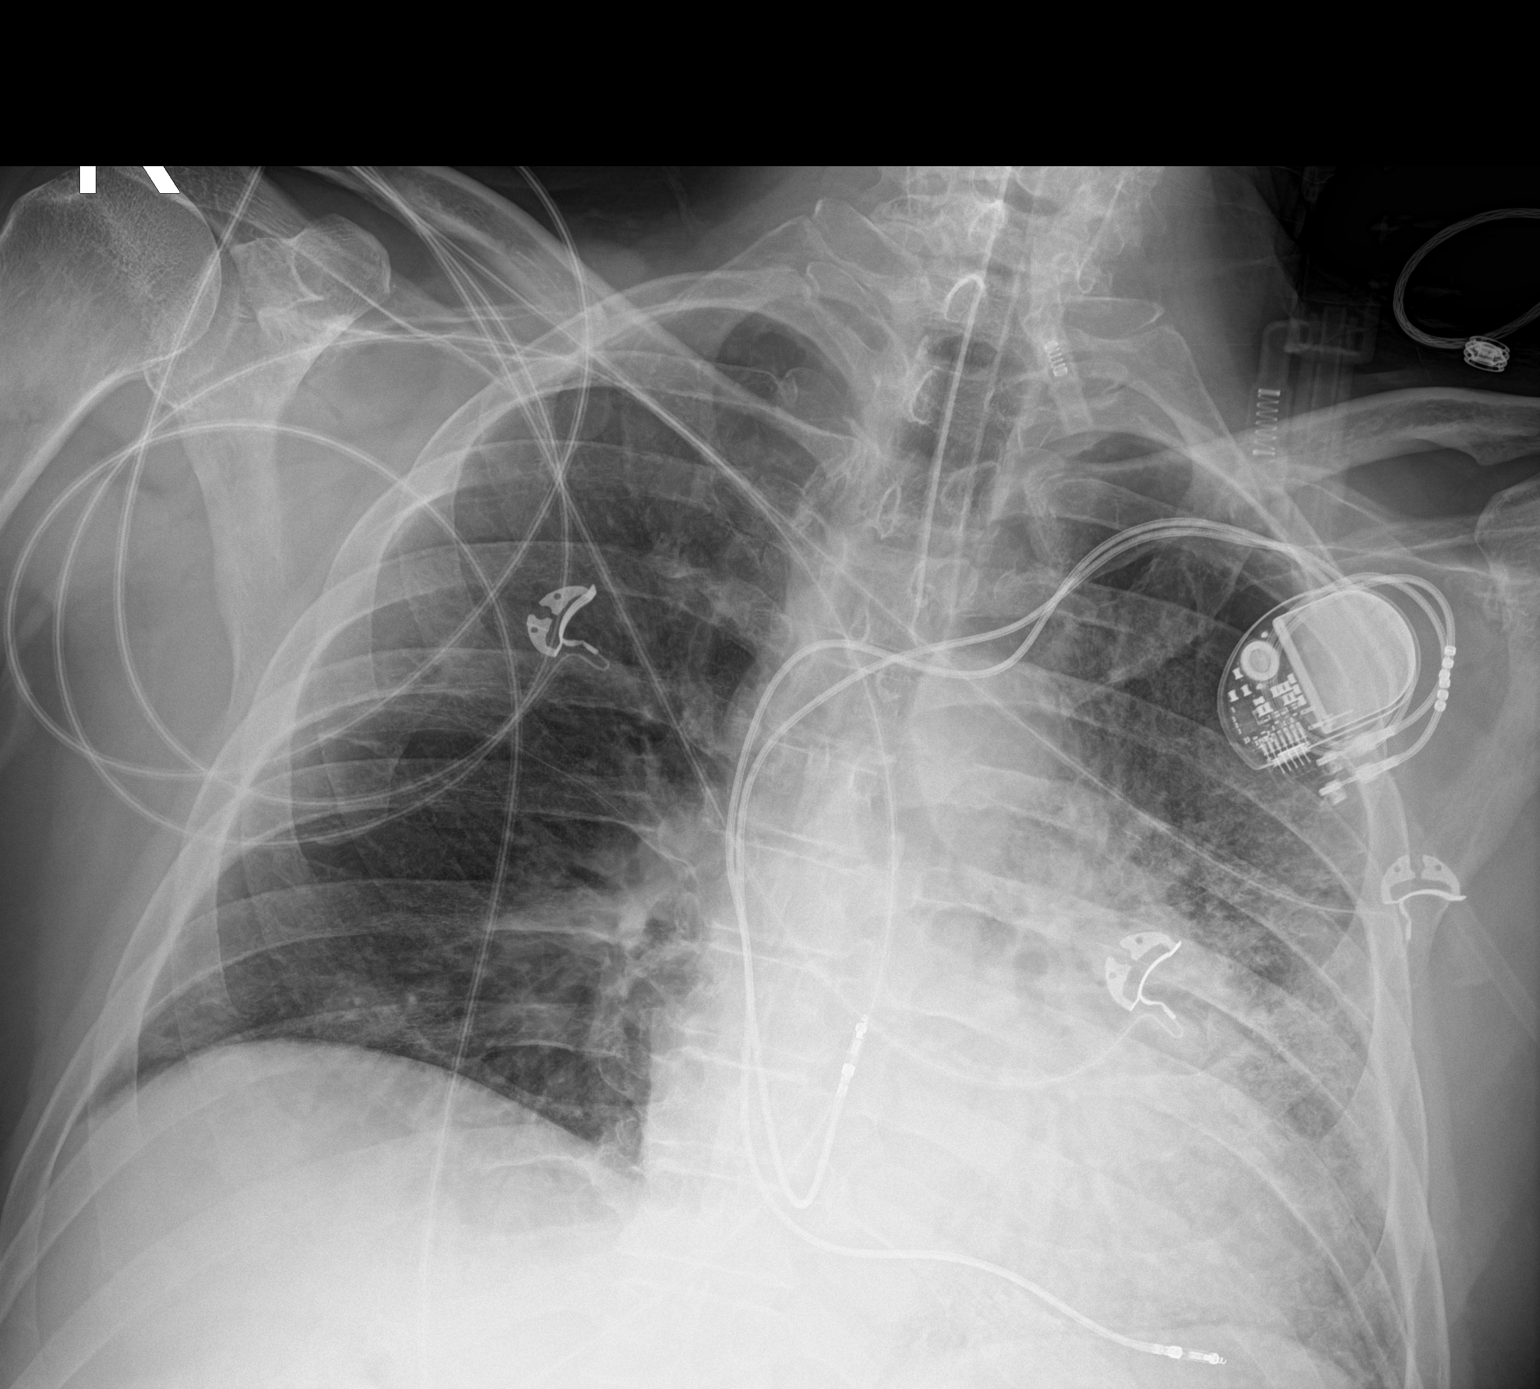

[1 of 1 positions shown; findings below may reference images not displayed]

FINDINGS: An endotracheal tube has been placed and terminates 3.5 cm above the
carina. The patient is mildly rotated to the left with unchanged
cardiomediastinal silhouette. A pacemaker remains in place. Airspace
opacities throughout the left mid and lower lung are unchanged.
There is minimal persistent opacity in the right lung base and
possibly right mid lung. No sizable pleural effusion or pneumothorax
is identified.
IMPRESSION: 1. Interval endotracheal tube placement as above.
2. Unchanged asymmetric left lung airspace disease.

## 2021-01-27 IMAGING — DX DG ABDOMEN 1V
1 series · 1 of 1 positions shown · non-contrast
Comparison: 09/17/2018

CLINICAL DATA: Ileus.

EXAM:
ABDOMEN - 1 VIEW

[abdomen kub]
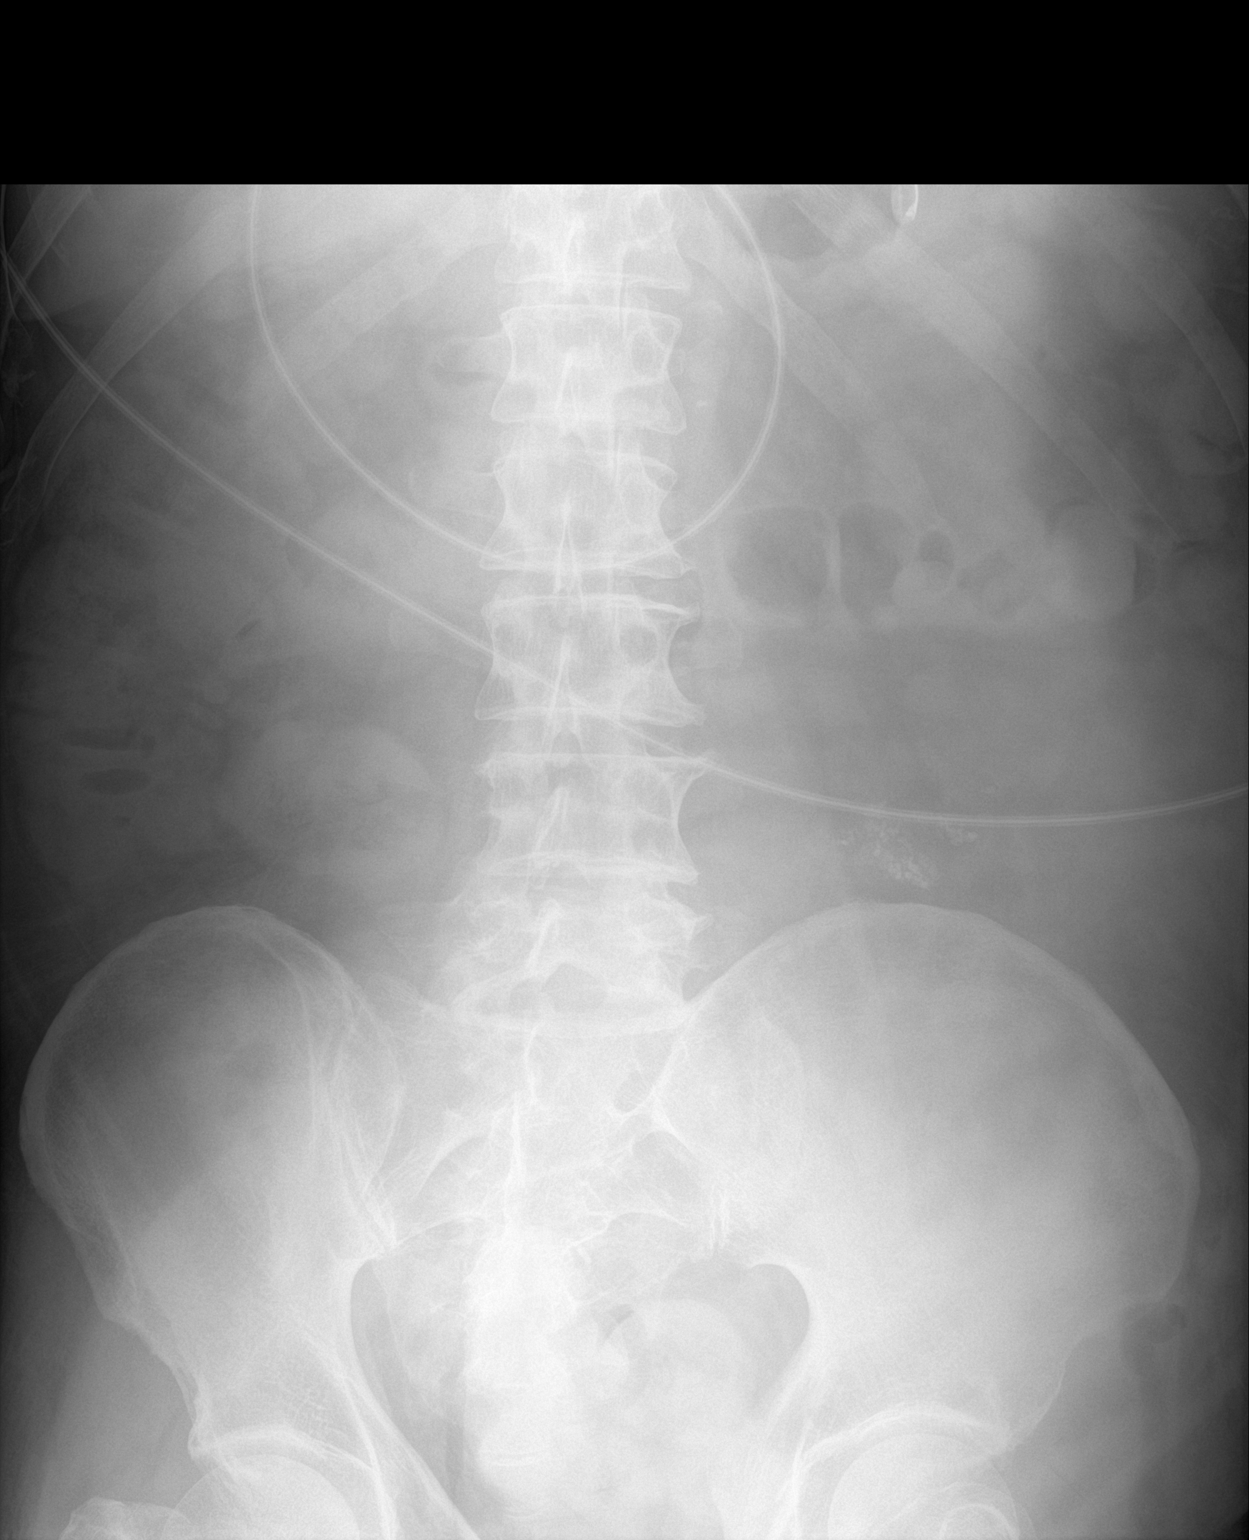

[1 of 1 positions shown; findings below may reference images not displayed]

FINDINGS: Enteric tube with tip and side-port over the stomach. There is
scattered high-density bowel loops which was presumably ingested
contrast. Cluster of calcifications over the low left flank, lower
than expected for the kidney, indeterminate. No evidence of ileus or
obstruction.
IMPRESSION: 1. No evidence of ileus or obstruction.
2. Apparent cluster of calcifications over the left flank,
indeterminate.

## 2021-01-30 IMAGING — DX DG CHEST 1V PORT
1 series · 1 of 1 positions shown · non-contrast
Comparison: 11/12/2020

CLINICAL DATA: Hypoxia

EXAM:
PORTABLE CHEST 1 VIEW

[chest ap]
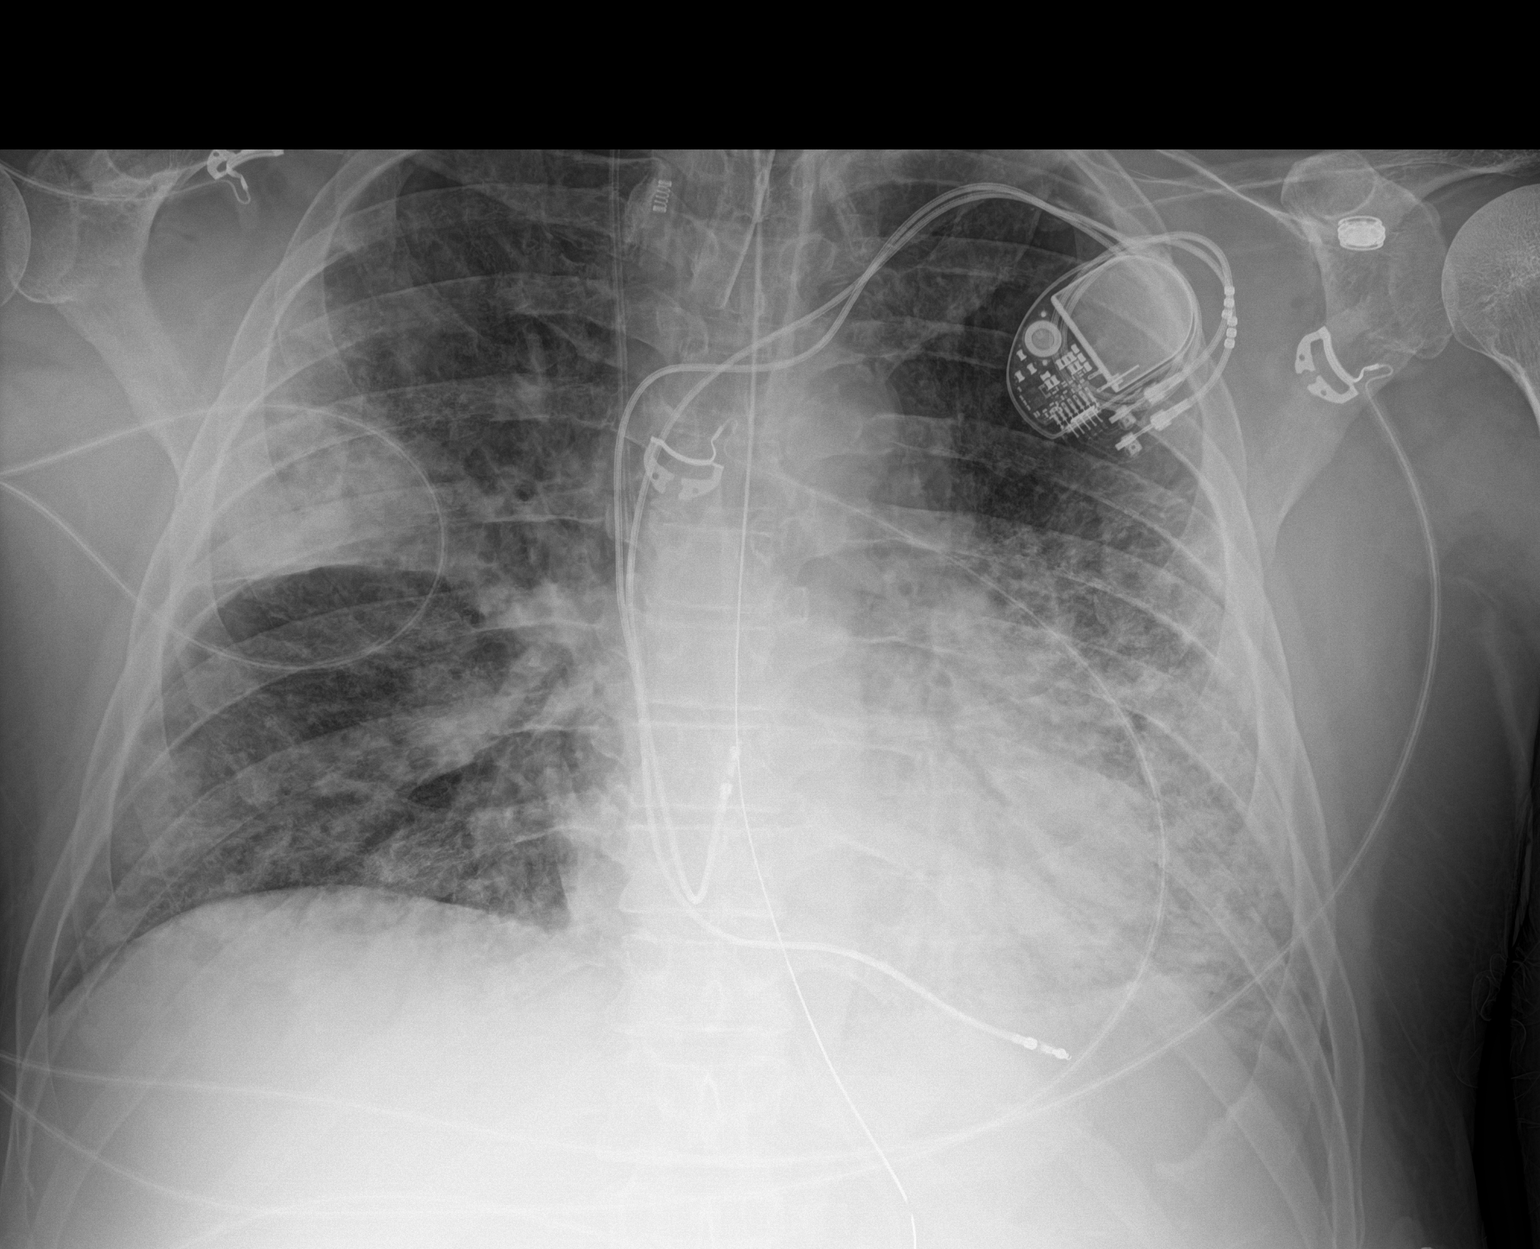

[1 of 1 positions shown; findings below may reference images not displayed]

FINDINGS: Left pacer, endotracheal tube and NG tube remain in place,
unchanged. Bilateral airspace opacities, most confluent in the left
lower lung and right upper lobe. No real change since prior study.
No effusions or pneumothorax.
IMPRESSION: Patchy bilateral airspace disease, not significantly changed since
prior study.
